# Patient Record
Sex: Female | Born: 1955 | Race: Black or African American | Hispanic: No | Marital: Married | Smoking: Never smoker
Health system: Southern US, Community
[De-identification: ages and names within clinical notes are randomized; demographics above are authoritative.]

## PROBLEM LIST (undated history)

## (undated) DIAGNOSIS — I10 Essential (primary) hypertension: Secondary | ICD-10-CM

## (undated) DIAGNOSIS — Z862 Personal history of diseases of the blood and blood-forming organs and certain disorders involving the immune mechanism: Secondary | ICD-10-CM

## (undated) DIAGNOSIS — T7840XA Allergy, unspecified, initial encounter: Secondary | ICD-10-CM

## (undated) DIAGNOSIS — H409 Unspecified glaucoma: Secondary | ICD-10-CM

## (undated) DIAGNOSIS — R001 Bradycardia, unspecified: Secondary | ICD-10-CM

## (undated) DIAGNOSIS — D571 Sickle-cell disease without crisis: Secondary | ICD-10-CM

## (undated) DIAGNOSIS — Z6841 Body Mass Index (BMI) 40.0 and over, adult: Secondary | ICD-10-CM

## (undated) HISTORY — PX: TUBAL LIGATION: SHX77

## (undated) HISTORY — DX: Unspecified glaucoma: H40.9

## (undated) HISTORY — DX: Body Mass Index (BMI) 40.0 and over, adult: Z684

## (undated) HISTORY — DX: Sickle-cell disease without crisis: D57.1

## (undated) HISTORY — DX: Allergy, unspecified, initial encounter: T78.40XA

## (undated) HISTORY — DX: Morbid (severe) obesity due to excess calories: E66.01

## (undated) HISTORY — PX: BREAST BIOPSY: SHX20

## (undated) HISTORY — PX: BREAST EXCISIONAL BIOPSY: SUR124

## (undated) HISTORY — PX: BIOPSY BREAST: PRO8

## (undated) HISTORY — DX: Bradycardia, unspecified: R00.1

## (undated) HISTORY — PX: UMBILICAL HERNIA REPAIR: SHX196

---

## 1996-09-13 HISTORY — PX: BREAST SURGERY: SHX581

## 2000-06-28 ENCOUNTER — Encounter: Payer: Self-pay | Admitting: Obstetrics and Gynecology

## 2000-06-28 ENCOUNTER — Ambulatory Visit (HOSPITAL_COMMUNITY): Admission: RE | Admit: 2000-06-28 | Discharge: 2000-06-28 | Payer: Self-pay | Admitting: Obstetrics and Gynecology

## 2001-10-16 ENCOUNTER — Other Ambulatory Visit: Admission: RE | Admit: 2001-10-16 | Discharge: 2001-10-16 | Payer: Self-pay | Admitting: Obstetrics and Gynecology

## 2001-12-20 ENCOUNTER — Encounter: Payer: Self-pay | Admitting: Obstetrics and Gynecology

## 2001-12-20 ENCOUNTER — Encounter: Admission: RE | Admit: 2001-12-20 | Discharge: 2001-12-20 | Payer: Self-pay | Admitting: Obstetrics and Gynecology

## 2002-07-29 ENCOUNTER — Emergency Department (HOSPITAL_COMMUNITY): Admission: EM | Admit: 2002-07-29 | Discharge: 2002-07-29 | Payer: Self-pay | Admitting: Emergency Medicine

## 2002-08-02 ENCOUNTER — Encounter: Payer: Self-pay | Admitting: Internal Medicine

## 2002-08-02 ENCOUNTER — Encounter: Admission: RE | Admit: 2002-08-02 | Discharge: 2002-08-02 | Payer: Self-pay | Admitting: Internal Medicine

## 2002-09-10 ENCOUNTER — Encounter: Payer: Self-pay | Admitting: Neurology

## 2002-09-10 ENCOUNTER — Encounter: Admission: RE | Admit: 2002-09-10 | Discharge: 2002-09-10 | Payer: Self-pay | Admitting: Neurology

## 2003-01-04 ENCOUNTER — Encounter: Admission: RE | Admit: 2003-01-04 | Discharge: 2003-01-04 | Payer: Self-pay | Admitting: Obstetrics and Gynecology

## 2003-01-04 ENCOUNTER — Encounter: Payer: Self-pay | Admitting: Obstetrics and Gynecology

## 2004-01-06 ENCOUNTER — Encounter: Admission: RE | Admit: 2004-01-06 | Discharge: 2004-01-06 | Payer: Self-pay | Admitting: Obstetrics and Gynecology

## 2005-01-27 ENCOUNTER — Other Ambulatory Visit: Admission: RE | Admit: 2005-01-27 | Discharge: 2005-01-27 | Payer: Self-pay | Admitting: Obstetrics and Gynecology

## 2005-01-27 ENCOUNTER — Encounter: Admission: RE | Admit: 2005-01-27 | Discharge: 2005-01-27 | Payer: Self-pay | Admitting: Obstetrics and Gynecology

## 2006-04-05 ENCOUNTER — Encounter: Admission: RE | Admit: 2006-04-05 | Discharge: 2006-04-05 | Payer: Self-pay | Admitting: Obstetrics and Gynecology

## 2006-04-19 ENCOUNTER — Emergency Department (HOSPITAL_COMMUNITY): Admission: EM | Admit: 2006-04-19 | Discharge: 2006-04-19 | Payer: Self-pay | Admitting: Family Medicine

## 2006-06-13 ENCOUNTER — Other Ambulatory Visit: Admission: RE | Admit: 2006-06-13 | Discharge: 2006-06-13 | Payer: Self-pay | Admitting: Obstetrics and Gynecology

## 2007-06-13 ENCOUNTER — Encounter: Admission: RE | Admit: 2007-06-13 | Discharge: 2007-06-13 | Payer: Self-pay | Admitting: Obstetrics and Gynecology

## 2008-06-13 ENCOUNTER — Encounter: Admission: RE | Admit: 2008-06-13 | Discharge: 2008-06-13 | Payer: Self-pay | Admitting: Obstetrics and Gynecology

## 2009-06-16 ENCOUNTER — Encounter: Admission: RE | Admit: 2009-06-16 | Discharge: 2009-06-16 | Payer: Self-pay | Admitting: Obstetrics and Gynecology

## 2010-06-17 ENCOUNTER — Encounter: Admission: RE | Admit: 2010-06-17 | Discharge: 2010-06-17 | Payer: Self-pay | Admitting: Obstetrics and Gynecology

## 2010-09-04 ENCOUNTER — Emergency Department (HOSPITAL_COMMUNITY)
Admission: EM | Admit: 2010-09-04 | Discharge: 2010-09-04 | Payer: Self-pay | Source: Home / Self Care | Admitting: Emergency Medicine

## 2011-05-11 ENCOUNTER — Other Ambulatory Visit: Payer: Self-pay | Admitting: Internal Medicine

## 2011-05-11 DIAGNOSIS — Z1231 Encounter for screening mammogram for malignant neoplasm of breast: Secondary | ICD-10-CM

## 2011-06-21 ENCOUNTER — Ambulatory Visit: Payer: Self-pay

## 2011-07-12 ENCOUNTER — Ambulatory Visit: Payer: Self-pay

## 2011-07-16 ENCOUNTER — Ambulatory Visit
Admission: RE | Admit: 2011-07-16 | Discharge: 2011-07-16 | Disposition: A | Discharge status: Home / Self Care | Payer: BC Managed Care – PPO | Source: Ambulatory Visit | Attending: Internal Medicine | Admitting: Internal Medicine

## 2011-07-16 DIAGNOSIS — Z1231 Encounter for screening mammogram for malignant neoplasm of breast: Secondary | ICD-10-CM

## 2011-08-13 ENCOUNTER — Emergency Department (HOSPITAL_COMMUNITY)
Admission: EM | Admit: 2011-08-13 | Discharge: 2011-08-13 | Disposition: A | Discharge status: Home / Self Care | Payer: BC Managed Care – PPO | Attending: Emergency Medicine | Admitting: Emergency Medicine

## 2011-08-13 ENCOUNTER — Encounter: Payer: Self-pay | Admitting: *Deleted

## 2011-08-13 DIAGNOSIS — H5789 Other specified disorders of eye and adnexa: Secondary | ICD-10-CM | POA: Insufficient documentation

## 2011-08-13 DIAGNOSIS — R21 Rash and other nonspecific skin eruption: Secondary | ICD-10-CM | POA: Insufficient documentation

## 2011-08-13 DIAGNOSIS — T7840XA Allergy, unspecified, initial encounter: Secondary | ICD-10-CM | POA: Insufficient documentation

## 2011-08-13 DIAGNOSIS — X58XXXA Exposure to other specified factors, initial encounter: Secondary | ICD-10-CM | POA: Insufficient documentation

## 2011-08-13 DIAGNOSIS — I10 Essential (primary) hypertension: Secondary | ICD-10-CM | POA: Insufficient documentation

## 2011-08-13 HISTORY — DX: Essential (primary) hypertension: I10

## 2011-08-13 MED ORDER — PREDNISONE 10 MG PO TABS: 20.0000 mg | ORAL_TABLET | Freq: Every day | ORAL | Status: AC

## 2011-08-13 MED ORDER — PREDNISONE 20 MG PO TABS
60.0000 mg | ORAL_TABLET | Freq: Once | ORAL | Status: AC
  Administered 2011-08-13: 60 mg via ORAL
  Filled 2011-08-13: qty 3

## 2011-08-13 MED ORDER — OLOPATADINE HCL 0.1 % OP SOLN
1.0000 [drp] | Freq: Two times a day (BID) | OPHTHALMIC | Status: DC
  Administered 2011-08-13: 1 [drp] via OPHTHALMIC
  Filled 2011-08-13: qty 5

## 2011-08-13 NOTE — ED Notes (Signed)
Pt presents w/ redness to L face, swelling, burning pain, scabbing lesions.

## 2011-08-13 NOTE — ED Provider Notes (Signed)
History     CSN: [id number] Arrival date & time: 08/13/2011  8:00 PM   First MD Initiated Contact with Patient 08/13/11 2013      Chief Complaint  Patient presents with  . Allergic Reaction    Pt c/o unilateral redness, burning lesions. R/o shingles    (Consider location/radiation/quality/duration/timing/severity/associated sxs/prior treatment) Patient is a 55 y.o. female presenting with allergic reaction. The history is provided by the patient. No language interpreter was used.  Allergic Reaction The primary symptoms are  rash. The primary symptoms do not include wheezing, shortness of breath, cough, nausea, vomiting, diarrhea, dizziness, palpitations or angioedema. The current episode started 13 to 24 hours ago. The problem has been gradually worsening. This is a new problem.  The rash is associated with itching.  Significant symptoms also include eye redness and itching. Significant symptoms that are not present include rhinorrhea.   Eyes weeping an itchy since about 11am today.  States that she used some eye drops that she may be allergic to from a month ago to her L eye.  Itching around her lips as well.  No angio edema.  Has taken nothing for the itching.  Past Medical History  Diagnosis Date  . Hypertension     Past Surgical History  Procedure Date  . Biopsy breast     History reviewed. No pertinent family history.  History  Substance Use Topics  . Smoking status: Never Smoker   . Smokeless tobacco: Not on file  . Alcohol Use: No    OB History    Grav Para Term Preterm Abortions TAB SAB Ect Mult Living                  Review of Systems  HENT: Negative for rhinorrhea.   Eyes: Positive for redness.  Respiratory: Negative for cough, shortness of breath and wheezing.   Cardiovascular: Negative for palpitations.  Gastrointestinal: Negative for nausea, vomiting and diarrhea.  Skin: Positive for itching and rash.  Neurological: Negative for dizziness.  All  other systems reviewed and are negative.    Allergies  Ibuprofen and Penicillins  Home Medications   Current Outpatient Rx  Name Route Sig Dispense Refill  . BIOTIN 5 MG PO CAPS Oral Take 2 capsules by mouth daily.      Marland Kitchen LISINOPRIL-HYDROCHLOROTHIAZIDE 10-12.5 MG PO TABS Oral Take 1 tablet by mouth daily.        BP 144/85  Pulse 108  Temp(Src) 97.9 F (36.6 C) (Oral)  Resp 20  SpO2 99%  Physical Exam  Nursing note and vitals reviewed. Constitutional: She is oriented to person, place, and time. She appears well-developed and well-nourished.  HENT:  Head: Normocephalic.  Eyes: Pupils are equal, round, and reactive to light. Right eye exhibits discharge. Left eye exhibits discharge.       Eyes red and itchy  With some swelling.    Cardiovascular: Normal rate.   Musculoskeletal: Normal range of motion. She exhibits no edema and no tenderness.  Neurological: She is alert and oriented to person, place, and time.  Skin: Skin is warm and dry. Rash noted.       Some itching but no swelling around lips.    ED Course  Procedures (including critical care time)  Labs Reviewed - No data to display No results found.   No diagnosis found.    MDM  Eye swelling and itching since this am.  Took nothing for itching. Prednisone 60mg  in ER .Marland KitchenWill start benadryl after she  drives home.  Rx for patinol as well.  Follow up with Dr. Lavona Mound on Monday or return if worse.        Jethro Bastos, NP 08/13/11 2125

## 2011-08-14 NOTE — ED Provider Notes (Signed)
Medical screening examination/treatment/procedure(s) were performed by non-physician practitioner and as supervising physician I was immediately available for consultation/collaboration.  Ghali Morissette, MD 08/14/11 0052 

## 2011-09-04 ENCOUNTER — Ambulatory Visit (INDEPENDENT_AMBULATORY_CARE_PROVIDER_SITE_OTHER): Payer: BC Managed Care – PPO

## 2011-09-04 DIAGNOSIS — J40 Bronchitis, not specified as acute or chronic: Secondary | ICD-10-CM

## 2011-09-04 DIAGNOSIS — R05 Cough: Secondary | ICD-10-CM

## 2011-11-11 ENCOUNTER — Ambulatory Visit: Payer: Self-pay | Admitting: Obstetrics and Gynecology

## 2012-01-25 ENCOUNTER — Ambulatory Visit: Payer: Self-pay | Admitting: Obstetrics and Gynecology

## 2012-02-23 ENCOUNTER — Ambulatory Visit (HOSPITAL_COMMUNITY)
Admission: RE | Admit: 2012-02-23 | Discharge: 2012-02-23 | Disposition: A | Discharge status: Home / Self Care | Payer: BC Managed Care – PPO | Source: Ambulatory Visit | Attending: Family Medicine | Admitting: Family Medicine

## 2012-02-23 ENCOUNTER — Ambulatory Visit (INDEPENDENT_AMBULATORY_CARE_PROVIDER_SITE_OTHER): Payer: BC Managed Care – PPO | Admitting: Family Medicine

## 2012-02-23 ENCOUNTER — Encounter: Payer: Self-pay | Admitting: Family Medicine

## 2012-02-23 VITALS — BP 126/82 | HR 66 | Resp 18 | Ht 65.0 in | Wt 311.0 lb

## 2012-02-23 DIAGNOSIS — Z1321 Encounter for screening for nutritional disorder: Secondary | ICD-10-CM

## 2012-02-23 DIAGNOSIS — M79672 Pain in left foot: Secondary | ICD-10-CM

## 2012-02-23 DIAGNOSIS — M7989 Other specified soft tissue disorders: Secondary | ICD-10-CM

## 2012-02-23 DIAGNOSIS — M25569 Pain in unspecified knee: Secondary | ICD-10-CM

## 2012-02-23 DIAGNOSIS — I10 Essential (primary) hypertension: Secondary | ICD-10-CM | POA: Insufficient documentation

## 2012-02-23 DIAGNOSIS — M79609 Pain in unspecified limb: Secondary | ICD-10-CM

## 2012-02-23 DIAGNOSIS — M25561 Pain in right knee: Secondary | ICD-10-CM

## 2012-02-23 DIAGNOSIS — Z13220 Encounter for screening for lipoid disorders: Secondary | ICD-10-CM

## 2012-02-23 DIAGNOSIS — Z1329 Encounter for screening for other suspected endocrine disorder: Secondary | ICD-10-CM

## 2012-02-23 MED ORDER — LISINOPRIL-HYDROCHLOROTHIAZIDE 10-12.5 MG PO TABS: 1.0000 | ORAL_TABLET | Freq: Every day | ORAL | Status: DC

## 2012-02-23 NOTE — Patient Instructions (Addendum)
f/u in 10 weeks.  CBC, fasting chem 7, lipid, TSH, HBA1C , vit D as soon as possible.  You will be referred to orthopedic Doc re left heel pain, spoke with pt after the visit, her main interest is cardiac eval. Record review shows she has had podiatry evaluation  recently.  Will refer to cardiology for asessment of cardiovascular status  CXR today.  Please bring immunization record to next visit.  You will get a 1500 calorie diet sheet, weight loss goal of 10 pounds in 10 weeks, this can be done!  You Need baby asprin one daily for stroke risk reduction, a;so calcium with D 1200mg  /1000Iu one gel capsule once daily for bone health

## 2012-02-23 NOTE — Progress Notes (Signed)
Subjective:    Patient ID: Stefanie Taylor, female    DOB: [birth year], 56 y.o.   MRN: [id number]  HPI The PT is here for establishment of care, concerns voiced are thinning and hair loss more so in the last 2 years.  Nails are thin , chipping and brittle  Menopause was 3 years ago. C/o swelling and deformity of ankles also c/o left foot pain states she was  Told  She had fasciitis, and heel spurs , she has orthotics and has basically been told she will need to deal with the discomfort till she feels it is unbearable to the extent that surgical intervention is needed, she is definitely not at that pointnow, and is more interested in focusing on weight loss. After attempts to get herin to see podiatry, who she had last seen in 2008, she sttated her main concern was that her ankle swelling is not due to heart disease, and no one has answered that questions. Records have been made available for review following the visit and theses are being peursed carefully. Has had colonoscopy by [name] in 2007, will f/u report Stefanie Taylor been seen by dermatology in 01/2012 for a rash, suggestion was that HCTZ be avoided, per notes from previous PC, pt was not interested in making any changes in her BP medication when she last saw her in May,,2013   Has allergies which she uses oTC medication for No incontinence issues No cardiologists, but desires cardiac evaluation due to intermittent leg swelling, hypertension , she is concerned about  possibility of CAD Family history is positive for hypertension and diabetes. Mother also noted to have "heart disease" One sister and paternal grandparents were alcoholic Knees hurt left more than right  Concerbned about her weight, wants to start with dietary change and increased physical activity, but after briefly asking for appetite suppressant , which I declined as she is new with HTN and requsting cardiac eval, she stated that she may well be interested in surgical  intervention  Eye exam up to date , and has upcoming pap with her gynecologist.  Menopause was around 2009, had irregular  post menopausal bleeding in 2011, she is followed by Dr Stefano Gaul and has been evaluated for this  Immunization she reports as being up to date , and will provide records, she travels out of the [place] often reportedly.         Review of Systems See HPI Denies recent fever or chills. Denies sinus pressure, nasal congestion, ear pain or sore throat. Denies chest congestion, productive cough or wheezing. Denies chest pains, palpitations , PND or orthopnea. C/O bilateral leg swelling which has  Concerned her for some time, wants cardiology evaluation  Denies abdominal pain, nausea, vomiting,diarrhea or constipation.   Denies dysuria, frequency, hesitancy or incontinence.  Denies headaches, seizures, numbness, or tingling. Denies depression, anxiety or insomnia.       Objective:   Physical Exam Patient alert and oriented and in no cardiopulmonary distress.  HEENT: No facial asymmetry, EOMI, no sinus tenderness,  oropharynx pink and moist.  Neck supple no adenopathy.  Chest: Clear to auscultation bilaterally.  CVS: S1, S2 no murmurs, no S3.  ABD: Soft non tender. Bowel sounds normal.  Ext:one plus pitting  edema  MS: Decreased ROM  Knees with deformity, also deformity in ankles  Skin: Intact, no ulcerations noted.  Psych: Good eye contact, normal affect. Memory intact not anxious or depressed appearing.  CNS: CN 2-12 intact, power normal throughout.  Assessment & Plan:

## 2012-02-24 LAB — LIPID PANEL
Cholesterol: 135 mg/dL (ref 0–200)
LDL Cholesterol: 74 mg/dL (ref 0–99)
Total CHOL/HDL Ratio: 2.5 Ratio
Triglycerides: 41 mg/dL (ref <150)
VLDL: 8 mg/dL (ref 0–40)

## 2012-02-24 LAB — CBC
MCH: 28 pg (ref 26.0–34.0)
MCHC: 33.5 g/dL (ref 30.0–36.0)
Platelets: 195 10*3/uL (ref 150–400)
RDW: 15.1 % (ref 11.5–15.5)
WBC: 4.1 10*3/uL (ref 4.0–10.5)

## 2012-02-24 LAB — BASIC METABOLIC PANEL
BUN: 13 mg/dL (ref 6–23)
Calcium: 9.1 mg/dL (ref 8.4–10.5)
Potassium: 3.6 mEq/L (ref 3.5–5.3)
Sodium: 138 mEq/L (ref 135–145)

## 2012-02-25 LAB — HEMOGLOBIN A1C
Hgb A1c MFr Bld: 5 % (ref <5.7)
Mean Plasma Glucose: 97 mg/dL (ref <117)

## 2012-02-29 DIAGNOSIS — M79672 Pain in left foot: Secondary | ICD-10-CM | POA: Insufficient documentation

## 2012-03-01 ENCOUNTER — Telehealth (HOSPITAL_COMMUNITY): Payer: Self-pay | Admitting: Dietician

## 2012-03-01 ENCOUNTER — Telehealth: Payer: Self-pay | Admitting: Family Medicine

## 2012-03-01 NOTE — Telephone Encounter (Signed)
Received referral from Dr. Lodema Hong ([facility]) via fax for dx: morbid obesity and HTN.

## 2012-03-02 DIAGNOSIS — R609 Edema, unspecified: Secondary | ICD-10-CM | POA: Insufficient documentation

## 2012-03-02 DIAGNOSIS — M25561 Pain in right knee: Secondary | ICD-10-CM | POA: Insufficient documentation

## 2012-03-02 NOTE — Assessment & Plan Note (Addendum)
Referred to nutritionist.  Counselled re calorie control and regular eating on schedule.  Encouraged regular physical activity.  Pt states she may be interested in surgical intervention, but will "see how she does " over the next several weeks

## 2012-03-02 NOTE — Telephone Encounter (Signed)
After I spoke with her today, she is really wanting her heart to be evaluated by cardiology, so I am referring to [facility] cardiology in [facility] Dr Dietrich Pates, I will also send him a message, pls get early morning appt

## 2012-03-02 NOTE — Assessment & Plan Note (Signed)
Likely due to osteoarthritis, has seen orthopedics in the past and wants to focus on weight losss. No h/o falls or instability

## 2012-03-02 NOTE — Assessment & Plan Note (Signed)
Controlled, no change in medication Mention is made of potential allergic skin reaction to HCTZ, pt is currently opting to remain on this medication

## 2012-03-02 NOTE — Assessment & Plan Note (Signed)
Chronic has been evaluated by podiatry in the past year, pt reports relief with prosthetics. Is aware that weight loss will play a significant role in improving pain

## 2012-03-02 NOTE — Assessment & Plan Note (Signed)
Lower extremity swelling in hypertensive pt, no recent cardiology evaluation will refer for same

## 2012-03-06 NOTE — Telephone Encounter (Signed)
Appointment scheduled for Monday, 03/13/12 at 2:00 PM.

## 2012-03-07 ENCOUNTER — Ambulatory Visit (INDEPENDENT_AMBULATORY_CARE_PROVIDER_SITE_OTHER): Payer: BC Managed Care – PPO | Admitting: Cardiovascular Disease

## 2012-03-07 ENCOUNTER — Encounter: Payer: Self-pay | Admitting: Cardiovascular Disease

## 2012-03-07 ENCOUNTER — Other Ambulatory Visit: Payer: Self-pay | Admitting: Cardiovascular Disease

## 2012-03-07 ENCOUNTER — Telehealth (HOSPITAL_COMMUNITY): Payer: Self-pay | Admitting: Dietician

## 2012-03-07 VITALS — BP 142/79 | HR 67 | Resp 18 | Ht 65.0 in | Wt 305.0 lb

## 2012-03-07 DIAGNOSIS — M79672 Pain in left foot: Secondary | ICD-10-CM

## 2012-03-07 DIAGNOSIS — M79609 Pain in unspecified limb: Secondary | ICD-10-CM

## 2012-03-07 DIAGNOSIS — R609 Edema, unspecified: Secondary | ICD-10-CM

## 2012-03-07 DIAGNOSIS — I10 Essential (primary) hypertension: Secondary | ICD-10-CM

## 2012-03-07 MED ORDER — LISINOPRIL 20 MG PO TABS: 20.0000 mg | ORAL_TABLET | Freq: Every day | ORAL | Status: DC

## 2012-03-07 MED ORDER — FUROSEMIDE 20 MG PO TABS: 20.0000 mg | ORAL_TABLET | Freq: Every day | ORAL | Status: DC

## 2012-03-07 NOTE — Assessment & Plan Note (Signed)
F/U podiatry She is hesitant to have surgery but limits her activity

## 2012-03-07 NOTE — Assessment & Plan Note (Signed)
Discussed bariatric surgery She is not interested. Diet and activity discussed.

## 2012-03-07 NOTE — Telephone Encounter (Signed)
Mailed appointment confirmation letter and instructions for appointment scheduled for 03/13/12 at 2:00 PM via [place] Mail.

## 2012-03-07 NOTE — Assessment & Plan Note (Signed)
Increase lisinopril to 20 mg Continue attempts at diet and weight loss

## 2012-03-07 NOTE — Assessment & Plan Note (Signed)
Dependant Doubt cardiac issues.  Echo and LE venous duplex D/C HCTZ and start Lasix 20 mg

## 2012-03-07 NOTE — Patient Instructions (Addendum)
**Note De-Identified Achille Xiang Obfuscation** Your physician has requested that you have an echocardiogram. Echocardiography is a painless test that uses sound waves to create images of your heart. It provides your doctor with information about the size and shape of your heart and how well your heart's chambers and valves are working. This procedure takes approximately one hour. There are no restrictions for this procedure.  Your physician has requested that you have a lower or upper extremity venous duplex. This test is an ultrasound of the veins in the legs or arms. It looks at venous blood flow that carries blood from the heart to the legs or arms. Allow one hour for a Lower Venous exam. Allow thirty minutes for an Upper Venous exam. There are no restrictions or special instructions.  Your physician recommends that you return for lab work in: 8 weeks  Your physician has recommended you make the following change in your medication: stop taking Lisinopril/HCT and start taking Lisinopril 20 mg daily and Furosemide 20 mg daily  Your physician recommends that you schedule a follow-up appointment in: as needed

## 2012-03-07 NOTE — Progress Notes (Signed)
Patient ID: Stefanie Taylor, female   DOB: 04-29-1956, 56 y.o.   MRN: [id number] 56 yo obese female with chronic LE edema.  Activity limited by bone spur in left foot.  On HCTZ for BP.  No history of DVT.  No history of cardiac problems.  Tries to do water aerobics and walk but limited.  Mild exertional dyspnea.  Compliant with meds.  Concerned that edema is from something besides obesity as she has some overweight friends who do not have swelling.  No history of kidney disease Recent Cr, TSH and A1c all normal.  Tries to watch her salt and has some support hose.    ROS: Denies fever, malais, weight loss, blurry vision, decreased visual acuity, cough, sputum, SOB, hemoptysis, pleuritic pain, palpitaitons, heartburn, abdominal pain, melena, lower extremity edema, claudication, or rash.  All other systems reviewed and negative   General: Affect appropriate Obese charming black female HEENT: normal Neck supple with no adenopathy JVP normal no bruits no thyromegaly Lungs clear with no wheezing and good diaphragmatic motion Heart:  S1/S2 no murmur,rub, gallop or click PMI normal Abdomen: benighn, BS positve, no tenderness, no AAA no bruit.  No HSM or HJR Distal pulses intact with no bruits Plus two bilateral  edema Neuro non-focal Skin warm and dry No muscular weakness  Medications Current Outpatient Prescriptions  Medication Sig Dispense Refill  . aspirin 81 MG tablet Take 81 mg by mouth daily.      . Cholecalciferol (VITAMIN D PO) Take by mouth daily.      . Diclofenac Sodium 1.5 % SOLN Place onto the skin as directed.      . Multiple Vitamin (MULTIVITAMIN) tablet Take 1 tablet by mouth daily. Source of life      . Specialty Vitamins Products (VITAMINS FOR HAIR PO) Take by mouth.        Allergies Ibuprofen and Penicillins  Family History: Family History  Problem Relation Age of Onset  . Hypertension Mother   . Heart disease Mother   . Cancer Mother   . Hypertension Father   .  Alcohol abuse Sister   . Hypertension Sister   . Diabetes Brother   . Hypertension Brother   . Diabetes Sister   . Hypertension Sister   . Diabetes Sister   . Hypertension Sister   . Diabetes Brother   . Hypertension Brother   . Diabetes Brother   . Hypertension Brother   . Diabetes Brother   . Hypertension Brother   . Hypertension Brother     Social History: History   Social History  . Marital Status: Married    Spouse Name: N/A    Number of Children: N/A  . Years of Education: N/A   Occupational History  . Not on file.   Social History Main Topics  . Smoking status: Never Smoker   . Smokeless tobacco: Not on file  . Alcohol Use: No  . Drug Use: No  . Sexually Active:    Other Topics Concern  . Not on file   Social History Narrative  . No narrative on file    Electrocardiogram:  NSR PAC LAE otherwise normal  Assessment and Plan

## 2012-03-08 ENCOUNTER — Ambulatory Visit: Payer: BC Managed Care – PPO | Admitting: Cardiovascular Disease

## 2012-03-13 ENCOUNTER — Encounter (HOSPITAL_COMMUNITY): Payer: Self-pay | Admitting: Dietician

## 2012-03-13 NOTE — Progress Notes (Signed)
Outpatient Initial Nutrition Assessment  Date:03/13/2012   Time: 2:15 PM  Referring Physician: Dr. Lodema Hong Reason for Visit: obesity, HTN  Nutrition Assessment:  Height: 5\' 5"  (165.1 cm)   Weight: 310 lb 12.8 oz (140.978 kg)   IBW: 125# %IBW: 249% UBW: 280# %UBW: 111% Body mass index is 51.72 kg/(m^2).  Goal Weight: 180# (per pt) Weight hx: Pt reports the last time she was at her UBW was 2 years ago. She reports her high weight was 322 in 2001 and her lowest weight was 180# in 1984. She desires to achieve that weight again, as that was where she was most comfortable. She report progressive weight gain starting 33 years ago, when she was pregnant with her adult daughter. She reports that she used to very very active and fit during her pregnancy and was able to fit into her pre-pregnancy clothes up until 5 months. She reports that her OB/GYN told her she was not gaining enough weight to support her pregnancy and ended up gaining 50# during the pregnancy. She has struggled with weight ever since, particuarly due to struggle of balancing career and family life.  Estimated nutritional needs: 2249-2453 kcals daily, [id number] grams protein daily, 2.2-2.5 L fluid daily  PMH:  Past Medical History  Diagnosis Date  . Hypertension     Medications:  Current Outpatient Rx  Name Route Sig Dispense Refill  . ASPIRIN 81 MG PO TABS Oral Take 81 mg by mouth daily.    Marland Kitchen VITAMIN D PO Oral Take by mouth daily.    Marland Kitchen DICLOFENAC SODIUM 1.5 % TD SOLN Transdermal Place onto the skin as directed.    . FUROSEMIDE 20 MG PO TABS Oral Take 1 tablet (20 mg total) by mouth daily. 90 tablet 3  . LISINOPRIL 20 MG PO TABS Oral Take 1 tablet (20 mg total) by mouth daily. 90 tablet 3  . ONE-DAILY MULTI VITAMINS PO TABS Oral Take 1 tablet by mouth daily. Source of life    . VITAMINS FOR HAIR PO Oral Take by mouth.      Labs: CMP     Component Value Date/Time   NA 138 02/24/2012 0830   K 3.6 02/24/2012 0830   CL 103  02/24/2012 0830   CO2 28 02/24/2012 0830   GLUCOSE 83 02/24/2012 0830   BUN 13 02/24/2012 0830   CREATININE 0.73 02/24/2012 0830   CALCIUM 9.1 02/24/2012 0830    Lipid Panel     Component Value Date/Time   CHOL 135 02/24/2012 0830   TRIG 41 02/24/2012 0830   HDL 53 02/24/2012 0830   CHOLHDL 2.5 02/24/2012 0830   VLDL 8 02/24/2012 0830   LDLCALC 74 02/24/2012 0830     Lab Results  Component Value Date   HGBA1C 5.0 02/24/2012   Lab Results  Component Value Date   LDLCALC 74 02/24/2012   CREATININE 0.73 02/24/2012     Lifestyle/ social habits: Ms. Stroschein lives in [place], [place] with her husband. She has an adult daughter who lives in [place]. 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 She drinks 0.5-1 bottle of wine with her dinner. She reports a very high stress level, due to work.   Nutrition hx/habits: Ms. Rom desires weight loss. She wants to lose 100#. She reports that she struggles with portion control and exercising. She reports she has been cutting back on starch and rice, as it aggravates her eczema. She reports that she does not eat raw  sugar. She has cut back on wine and coconut cake. She eats out 1 x per month. She and her husband share cooking responsibilities at home. She has started walking (sporadically) and recently bought a bicycle. She reports she used to do water aerobics and she achieved results when she did, but has not done those since the aquatic center discontinued them. She has been unable to find another gym or aquatic center that meets her needs.  Diet recall: 6:30 AM: water or coffee with french vanilla whey protein; Breakfast (8-8:30 AM): 2 pieces whole wheat toast, 2 scrambled eggs, shredded cheddar cheese; Lunch (12 PM): vegetable medley, [place] wing; Dinner: asparagus, tilapia, green pepper, pineapple. She drinks 1 gallon of water daily.   Nutrition Diagnosis: Involuntary weight gain r/t excessive energy intake,  physical inactivity AEB 30# (10.7%) weight gain x 2 years.   Nutrition Intervention: Nutrition rx: 1800 kcal NAS, no added sugar diet; limit 1 starch per meal; low calorie beverages only; 30 minutes physical activity 5 times per week  Education/Counseling Provided: Educated pt on weight loss principles. Emphasized plate method, healthy food preparation methods, and portion control. Discussed slow, moderate weight loss. Discussed importance of physical activity along with dietary modification to assist with weight loss. Showed pt functionality of MyFitnessPal app. Discussed healthy snack ideas. Provided handouts from the Academy of Nutrition and Dietetics (Weight Loss Tips and 1500 Calorie Meal Plan). Also provided Nov[employer] MedLink carb counting booklet for portion control.   Understanding, Motivation, Ability to Follow Recommendations: Expect fair to good compliance.  Monitoring and Evaluation: Goals: 1) 1-2# weight loss per week; 2) 30 minutes physical activity 5 times per week  Recommendations: 1) For weight loss: 1749-1953 kcals daily; 2) Break physical activity up into smaller, more frequent sessions; 3) Choose an exercise you enjoy; 4) Limit wine to 1 glass daily; 5) Keep food diary (ex. MyFitnessPal)  F/U: PRN. Provided RD contact information.   Orlene Plum, RD  03/13/2012  Time: 2:15 PM

## 2012-03-14 ENCOUNTER — Ambulatory Visit (HOSPITAL_COMMUNITY): Payer: BC Managed Care – PPO

## 2012-03-14 ENCOUNTER — Ambulatory Visit (HOSPITAL_COMMUNITY)
Admission: RE | Admit: 2012-03-14 | Discharge: 2012-03-14 | Disposition: A | Discharge status: Home / Self Care | Payer: BC Managed Care – PPO | Source: Ambulatory Visit | Attending: Cardiovascular Disease | Admitting: Cardiovascular Disease

## 2012-03-14 DIAGNOSIS — M7989 Other specified soft tissue disorders: Secondary | ICD-10-CM | POA: Insufficient documentation

## 2012-03-14 DIAGNOSIS — R609 Edema, unspecified: Secondary | ICD-10-CM

## 2012-03-14 DIAGNOSIS — M7120 Synovial cyst of popliteal space [Baker], unspecified knee: Secondary | ICD-10-CM | POA: Insufficient documentation

## 2012-03-15 ENCOUNTER — Ambulatory Visit (HOSPITAL_COMMUNITY)
Admission: RE | Admit: 2012-03-15 | Discharge: 2012-03-15 | Disposition: A | Discharge status: Home / Self Care | Payer: BC Managed Care – PPO | Source: Ambulatory Visit | Attending: Cardiovascular Disease | Admitting: Cardiovascular Disease

## 2012-03-15 DIAGNOSIS — I10 Essential (primary) hypertension: Secondary | ICD-10-CM | POA: Insufficient documentation

## 2012-03-15 DIAGNOSIS — I519 Heart disease, unspecified: Secondary | ICD-10-CM

## 2012-03-15 DIAGNOSIS — R609 Edema, unspecified: Secondary | ICD-10-CM | POA: Insufficient documentation

## 2012-03-15 NOTE — Progress Notes (Signed)
*  PRELIMINARY RESULTS* Echocardiogram 2D Echocardiogram has been performed.  Caswell Corwin 03/15/2012, 8:35 AM

## 2012-03-24 ENCOUNTER — Telehealth: Payer: Self-pay

## 2012-03-24 MED ORDER — HYDROCHLOROTHIAZIDE 25 MG PO TABS: 25.0000 mg | ORAL_TABLET | Freq: Every day | ORAL | Status: DC

## 2012-03-24 NOTE — Telephone Encounter (Signed)
Stop lasix and take HCTZ 25 mg

## 2012-03-24 NOTE — Telephone Encounter (Signed)
**Note De-Identified Stefanie Taylor Obfuscation** Pt's assistant, Toniann Fail, advised and she verbalized understanding. RX sent to [facility] in [place]./LV

## 2012-03-24 NOTE — Telephone Encounter (Addendum)
Pt. states that she is having allergic reaction to change in medications. On last OV with Dr. Eden Emms on 6-25 pt. was advised to stop taking Lisinopril/HCT 10/12.5 mg qd and start taking Lisinopril 20 mg qd and Furosemide 20 mg qd. She states that within a few days she developed extreme itching and what she describes as "lesions" appeared on her skin. She states she went to ED at [facility]. On 7-10 and was advised to stop taking both Furosemide and Lisinopril  And to start taking Predisone 20 mg tid X 4 days and Diphenenhydramine 25 mg to be taken q6hrs. and to contact us that she is not taking anything at this time for BP. Pt's BP this morning was 145/90. Please advise./LV

## 2012-04-09 ENCOUNTER — Ambulatory Visit (INDEPENDENT_AMBULATORY_CARE_PROVIDER_SITE_OTHER): Payer: BC Managed Care – PPO | Admitting: Physician Assistant

## 2012-04-09 VITALS — BP 109/71 | HR 66 | Temp 98.0°F | Resp 16 | Ht 63.75 in | Wt 304.0 lb

## 2012-04-09 DIAGNOSIS — H1013 Acute atopic conjunctivitis, bilateral: Secondary | ICD-10-CM

## 2012-04-09 DIAGNOSIS — H11009 Unspecified pterygium of unspecified eye: Secondary | ICD-10-CM

## 2012-04-09 MED ORDER — OLOPATADINE HCL 0.1 % OP SOLN: 1.0000 [drp] | Freq: Two times a day (BID) | OPHTHALMIC | Status: DC

## 2012-04-09 NOTE — Progress Notes (Signed)
  Subjective:    Patient ID: Stefanie Taylor, female    DOB: 16-Nov-1955, 56 y.o.   MRN: [id number]  HPI  Pt presents to clinic with B eye redness and tearing that started yesterday afternoon without any known injury to her eye.  Her eyes do not hurts but they are irritated.  She is having no vision problems and no photophobia.  She has been using OTC eye gtts which has helped with her irritation.  Her eyes were matted this am slightly.  She has had no exposures to bacterial conjunctivitis or cold symptoms.  She does have seasonal allergies.  Review of Systems  Constitutional: Negative for fever.  HENT: Negative for congestion, rhinorrhea and postnasal drip.   Eyes: Positive for discharge, redness and itching. Negative for photophobia, pain and visual disturbance.       Objective:   Physical Exam  Vitals reviewed. Constitutional: She is oriented to person, place, and time. She appears well-developed and well-nourished.  HENT:  Head: Normocephalic and atraumatic.  Right Ear: External ear normal.  Left Ear: External ear normal.  Nose: Nose normal.  Eyes: EOM are normal. Pupils are equal, round, and reactive to light. Right eye exhibits discharge (tears). Left eye exhibits discharge (Tears). Right conjunctiva is injected. Right conjunctiva has no hemorrhage. Left conjunctiva is injected. Left conjunctiva has no hemorrhage. No scleral icterus.       Both eyelids are edematous   Neck: Neck supple.  Pulmonary/Chest: Effort normal.  Neurological: She is alert and oriented to person, place, and time.  Skin: Skin is warm and dry. Rash noted.  Psychiatric: She has a normal mood and affect. Her behavior is normal. Judgment and thought content normal.      Assessment & Plan:   1. Allergic conjunctivitis of both eyes  olopatadine (PATANOL) 0.1 % ophthalmic solution   Pt to try and stop rubbing eyes because that is probably why her eyelids are swollen.  She should use Cool no cold compresses  to help with swelling.  She can add a OTC allergy medication for quicker improvement.  Pt to monitor to change in discharge to yellow purulence and call us if that occurs.  Pt voiced understanding and agreed with the above.

## 2012-04-24 ENCOUNTER — Other Ambulatory Visit: Payer: Self-pay | Admitting: *Deleted

## 2012-04-24 DIAGNOSIS — I10 Essential (primary) hypertension: Secondary | ICD-10-CM

## 2012-04-26 ENCOUNTER — Encounter (HOSPITAL_COMMUNITY): Payer: Self-pay | Admitting: Emergency Medicine

## 2012-04-26 ENCOUNTER — Emergency Department (HOSPITAL_COMMUNITY)
Admission: EM | Admit: 2012-04-26 | Discharge: 2012-04-26 | Disposition: A | Discharge status: Home / Self Care | Payer: BC Managed Care – PPO | Attending: Emergency Medicine | Admitting: Emergency Medicine

## 2012-04-26 ENCOUNTER — Telehealth: Payer: Self-pay | Admitting: Family Medicine

## 2012-04-26 DIAGNOSIS — Z833 Family history of diabetes mellitus: Secondary | ICD-10-CM | POA: Insufficient documentation

## 2012-04-26 DIAGNOSIS — Z8249 Family history of ischemic heart disease and other diseases of the circulatory system: Secondary | ICD-10-CM | POA: Insufficient documentation

## 2012-04-26 DIAGNOSIS — Z880 Allergy status to penicillin: Secondary | ICD-10-CM | POA: Insufficient documentation

## 2012-04-26 DIAGNOSIS — I10 Essential (primary) hypertension: Secondary | ICD-10-CM | POA: Insufficient documentation

## 2012-04-26 DIAGNOSIS — L309 Dermatitis, unspecified: Secondary | ICD-10-CM

## 2012-04-26 DIAGNOSIS — Z7982 Long term (current) use of aspirin: Secondary | ICD-10-CM | POA: Insufficient documentation

## 2012-04-26 DIAGNOSIS — L259 Unspecified contact dermatitis, unspecified cause: Secondary | ICD-10-CM | POA: Insufficient documentation

## 2012-04-26 DIAGNOSIS — Z6379 Other stressful life events affecting family and household: Secondary | ICD-10-CM | POA: Insufficient documentation

## 2012-04-26 DIAGNOSIS — Z809 Family history of malignant neoplasm, unspecified: Secondary | ICD-10-CM | POA: Insufficient documentation

## 2012-04-26 MED ORDER — TRIAMCINOLONE ACETONIDE 40 MG/ML IJ SUSP
40.0000 mg | Freq: Once | INTRAMUSCULAR | Status: AC
  Administered 2012-04-26: 40 mg via INTRAMUSCULAR
  Filled 2012-04-26: qty 1

## 2012-04-26 MED ORDER — TRIAMCINOLONE ACETONIDE 0.1 % EX CREA: TOPICAL_CREAM | Freq: Two times a day (BID) | CUTANEOUS | Status: DC

## 2012-04-26 NOTE — ED Notes (Signed)
Pt presenting to ed with c/o eczema flare up pt states rash to her upper body and thighs. Pt states she doesn't have any medications for eczema. Pt states positive itching and discomfort. Pt states onset x 1 day

## 2012-04-26 NOTE — ED Provider Notes (Signed)
History     CSN: [id number]  Arrival date & time 04/26/12  1622   First MD Initiated Contact with Patient 04/26/12 1739      Chief Complaint  Patient presents with  . Rash    (Consider location/radiation/quality/duration/timing/severity/associated sxs/prior treatment) HPI  56 y.o. female in no acute distress complaining of eczema and flareup over the course of the last day. Itching is not relieved with Benadryl denies any fever, discharge, change from prior eczema exacerbations. Rash is located on the torso bilateral upper extremities and thighs.   Past Medical History  Diagnosis Date  . Hypertension     Past Surgical History  Procedure Date  . Biopsy breast   . Breast surgery 1998    biopsy mole on right breast    Family History  Problem Relation Age of Onset  . Hypertension Mother   . Heart disease Mother   . Cancer Mother   . Hypertension Father   . Alcohol abuse Sister   . Hypertension Sister   . Diabetes Brother   . Hypertension Brother   . Diabetes Sister   . Hypertension Sister   . Diabetes Sister   . Hypertension Sister   . Diabetes Brother   . Hypertension Brother   . Diabetes Brother   . Hypertension Brother   . Diabetes Brother   . Hypertension Brother   . Hypertension Brother     History  Substance Use Topics  . Smoking status: Never Smoker   . Smokeless tobacco: Not on file  . Alcohol Use: No    OB History    Grav Para Term Preterm Abortions TAB SAB Ect Mult Living                  Review of Systems  Skin: Positive for rash.  All other systems reviewed and are negative.    Allergies  Lasix; Ibuprofen; and Penicillins  Home Medications   Current Outpatient Rx  Name Route Sig Dispense Refill  . ASPIRIN EC 81 MG PO TBEC Oral Take 81 mg by mouth daily.    Marland Kitchen CETIRIZINE HCL 10 MG PO TABS Oral Take 10 mg by mouth daily.    Marland Kitchen VITAMIN D 1000 UNITS PO TABS Oral Take 1,000 Units by mouth daily.    Marland Kitchen CINNAMON PO Oral Take 1 capsule  by mouth daily.    Marland Kitchen CLOBETASOL PROPIONATE 0.05 % EX OINT Topical Apply 1 application topically 2 (two) times daily.    Marland Kitchen DIPHENHYDRAMINE HCL 25 MG PO CAPS Oral Take 25 mg by mouth every 6 (six) hours as needed. For itching.    Marland Kitchen HYDROCHLOROTHIAZIDE 25 MG PO TABS Oral Take 1 tablet (25 mg total) by mouth daily. 90 tablet 3    To replace Lasix  . LISINOPRIL 20 MG PO TABS Oral Take 1 tablet (20 mg total) by mouth daily. 90 tablet 3  . PREDNISOLONE ACETATE 1 % OP SUSP Both Eyes Place 1 drop into both eyes 2 (two) times daily.      BP 143/80  Pulse 69  Temp 98.9 F (37.2 C) (Oral)  Resp 18  SpO2 100%  Physical Exam  Nursing note and vitals reviewed. Constitutional: She is oriented to person, place, and time. She appears well-developed and well-nourished. No distress.  HENT:  Head: Normocephalic.  Eyes: Conjunctivae and EOM are normal.  Cardiovascular: Normal rate.   Pulmonary/Chest: Effort normal.  Abdominal: Soft. Bowel sounds are normal.  Musculoskeletal: Normal range of motion.  Neurological: She  is alert and oriented to person, place, and time.  Skin: Rash noted.       Hyperpigmented raised lesions to epigastric region and extensor surfaces of upper and lower extremities. The signs of infection: No tenderness, warmth, erythema or induration.  Psychiatric: She has a normal mood and affect.    ED Course  Procedures (including critical care time)  Labs Reviewed - No data to display No results found.   1. Eczema       MDM  Eczema exacerbation I will give her a shot of Kenalog 40 mg and write her for Kenalog topical.         Wynetta Emery, PA-C 04/27/12 1610

## 2012-04-26 NOTE — Telephone Encounter (Signed)
Spoke with gentleman, who stated that patient had gone to the ED due to severe eczema. I will call her back in am

## 2012-04-26 NOTE — ED Notes (Signed)
Pt reports that she has tried oatmeal baths,aquafor, and cocoa butter, anti-itch oil/gel, olive oil, shea butter all OTC with no relief. Pt states," something is drying me out and I don't know what it is." Pt reports that she stays hydrated and avoids sodas. Pt reports sleeplessness as a result of the flare-up.

## 2012-04-27 ENCOUNTER — Other Ambulatory Visit: Payer: Self-pay

## 2012-04-27 ENCOUNTER — Other Ambulatory Visit: Payer: Self-pay | Admitting: Family Medicine

## 2012-04-27 ENCOUNTER — Telehealth: Payer: Self-pay

## 2012-04-27 MED ORDER — PREDNISONE (PAK) 5 MG PO TABS: 5.0000 mg | ORAL_TABLET | ORAL | Status: DC

## 2012-04-27 NOTE — Telephone Encounter (Signed)
With pt, states her eczema so bad on entire trunk, everything is painful, unable to go to court. Has had prednisone dose pack in the beginning of July, was at [facility] with similar complaint,this helped.already taking OTC benadryl, will send in prednisone dose pack, she is aware

## 2012-04-27 NOTE — ED Provider Notes (Signed)
Medical screening examination/treatment/procedure(s) were performed by non-physician practitioner and as supervising physician I was immediately available for consultation/collaboration.  Toy Baker, MD 04/27/12 2322

## 2012-05-02 ENCOUNTER — Ambulatory Visit: Payer: BC Managed Care – PPO | Admitting: Family Medicine

## 2012-05-03 ENCOUNTER — Encounter: Payer: Self-pay | Admitting: *Deleted

## 2012-05-05 ENCOUNTER — Ambulatory Visit: Payer: BC Managed Care – PPO | Admitting: Family Medicine

## 2012-05-08 ENCOUNTER — Telehealth: Payer: Self-pay | Admitting: Family Medicine

## 2012-05-08 ENCOUNTER — Ambulatory Visit (HOSPITAL_COMMUNITY)
Admission: RE | Admit: 2012-05-08 | Discharge: 2012-05-08 | Disposition: A | Discharge status: Home / Self Care | Payer: BC Managed Care – PPO | Source: Ambulatory Visit | Attending: Physical Medicine and Rehabilitation | Admitting: Physical Medicine and Rehabilitation

## 2012-05-08 ENCOUNTER — Other Ambulatory Visit (HOSPITAL_COMMUNITY): Payer: Self-pay | Admitting: Physical Medicine and Rehabilitation

## 2012-05-08 DIAGNOSIS — M25579 Pain in unspecified ankle and joints of unspecified foot: Secondary | ICD-10-CM | POA: Insufficient documentation

## 2012-05-08 DIAGNOSIS — R52 Pain, unspecified: Secondary | ICD-10-CM

## 2012-05-08 DIAGNOSIS — M7730 Calcaneal spur, unspecified foot: Secondary | ICD-10-CM | POA: Insufficient documentation

## 2012-05-08 LAB — BASIC METABOLIC PANEL
BUN: 15 mg/dL (ref 6–23)
Calcium: 9.9 mg/dL (ref 8.4–10.5)
Creat: 0.71 mg/dL (ref 0.50–1.10)

## 2012-05-10 ENCOUNTER — Telehealth: Payer: Self-pay | Admitting: Cardiovascular Disease

## 2012-05-10 NOTE — Telephone Encounter (Signed)
Request sent to Dr. Eden Emms.

## 2012-05-10 NOTE — Telephone Encounter (Signed)
Please advise 

## 2012-05-10 NOTE — Telephone Encounter (Signed)
PT WAS SEEN BY DERMATOLOGIST AND IT ALLERGIC TO SULFUR BASED MEDS. SHE IS ON FUROSEMIDE AND HTZ.

## 2012-05-11 NOTE — Telephone Encounter (Signed)
Discussed with pharmacy and reviewed her chart. Last note from Dr. Eden Emms has her off of HCTZ and changed to lasix only. She takes the diuretic for chronic LEE and hypertension. She can take Edecrin (or generic eqivalent) 25 mg BID and stop the lasix. She will need BP check and BMET in one week, once she changes to new medication.

## 2012-05-11 NOTE — Telephone Encounter (Signed)
She can continue to take lasix and HCTZ unless she is having an allergic reaction to it now. There are many people allergic to sulfa drugs who are able to take lasix and HCTZ. If she is having reaction, will consider alternative.

## 2012-05-11 NOTE — Telephone Encounter (Signed)
Patient has been diagnosed with severe eczema, which her dermatologist feels is directly related to her medication regimen.  Has been in the ED 6 times since last visit with Dr Eden Emms.

## 2012-05-16 NOTE — Telephone Encounter (Signed)
Message left for return phone call

## 2012-05-23 ENCOUNTER — Encounter: Payer: Self-pay | Admitting: Family Medicine

## 2012-05-23 ENCOUNTER — Ambulatory Visit (INDEPENDENT_AMBULATORY_CARE_PROVIDER_SITE_OTHER): Payer: BC Managed Care – PPO | Admitting: Family Medicine

## 2012-05-23 VITALS — BP 124/84 | HR 65 | Resp 16 | Ht 65.0 in | Wt 309.0 lb

## 2012-05-23 DIAGNOSIS — R609 Edema, unspecified: Secondary | ICD-10-CM

## 2012-05-23 DIAGNOSIS — I10 Essential (primary) hypertension: Secondary | ICD-10-CM

## 2012-05-23 NOTE — Progress Notes (Signed)
  Subjective:    Patient ID: Stefanie Taylor, female    DOB: 1955-12-18, 56 y.o.   MRN: [id number]  HPI Pt in today, has had 2 emergency room visits and one urgent care visit since last oV due to severe eczema/allergic reaction that has kept her out of work. 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 Has appt at [facility] in  derm upcoming. Most recently eye involved, saw opthalmology, got drops to use.Had to be out of work for some time due to symptom severity  triamcnolone0.1 cream and neosporin eczema is working best on her skin, in he opinion N weight loss success as of yet, not keen on surgical intervention, feels she " can do it on her own" and has been overweight all her life. Concerned about "hanging skin" should she lose a lot of weight. Interested in getting number to call for group session on surgical intervention. Still concerned about "leg swelling". Has had negative cardiac eval, concerned that diuretic in her bP med will need to be withdrawn.I  Advised sodium reduction, leg elevation and compression hose   Review of Systems See HPI Denies recent fever or chills. Denies sinus pressure, nasal congestion, ear pain or sore throat. Denies chest congestion, productive cough or wheezing. Denies chest pains, palpitations PND or orthopnea Denies abdominal pain, nausea, vomiting,diarrhea or constipation.   Denies dysuria, frequency, hesitancy or incontinence. Denies joint pain, swelling and limitation in mobility. Denies headaches, seizures, numbness, or tingling. Denies depression, anxiety or insomnia. Denies skin break down or rash.        Objective:   Physical Exam Patient alert and oriented and in no cardiopulmonary distress.  HEENT: No facial asymmetry, EOMI, no sinus tenderness,  oropharynx pink and moist.  Neck supple no adenopathy.  Chest: Clear to auscultation bilaterally.  CVS: S1, S2 no murmurs,  no S3.  ABD: Soft non tender. Bowel sounds normal.  Ext: No edema  MS: Adequate ROM spine, shoulders, hips and knees.  Skin: Intact, no ulcerations or rash noted.  Psych: Good eye contact, normal affect. Memory intact not anxious or depressed appearing.  CNS: CN 2-12 intact, power, tone and sensation normal throughout.        Assessment & Plan:

## 2012-05-23 NOTE — Patient Instructions (Addendum)
F/u in 6 weeks , call if you need me   You will need to change your blood pressure pill if indeed you have a sulphur allergy I will call as sson as I have the information  It is important that you exercise regularly at least 30 minutes 5 times a week. If you develop chest pain, have severe difficulty breathing, or feel very tired, stop exercising immediately and seek medical attention   A healthy diet is rich in fruit, vegetables and whole grains. Poultry fish, nuts and beans are a healthy choice for protein rather then red meat. A low sodium diet and drinking 64 ounces of water daily is generally recommended. Oils and sweet should be limited. Carbohydrates especially for those who are diabetic or overweight, should be limited to 34-45 gram per meal. It is important to eat on a regular schedule, at least 3 times daily. Snacks should be primarily fruits, vegetables or nuts.

## 2012-05-24 ENCOUNTER — Telehealth: Payer: Self-pay | Admitting: Family Medicine

## 2012-05-24 NOTE — Telephone Encounter (Signed)
Please contact dr lupton's nurse before sending the note over that I have written tohim, as I am asking for a response. I have reviewed info sent, pt gave me the opinion she was told she had a sulfa allergy, iut record review does not support this so i am seeking clarity from Dr Terri Piedra

## 2012-05-24 NOTE — Telephone Encounter (Signed)
Letter sent to dr. Dorita Sciara office with note needing clarification of supposed allergy

## 2012-05-25 ENCOUNTER — Other Ambulatory Visit: Payer: Self-pay

## 2012-05-25 ENCOUNTER — Telehealth: Payer: Self-pay | Admitting: Family Medicine

## 2012-05-25 MED ORDER — LISINOPRIL 20 MG PO TABS: 20.0000 mg | ORAL_TABLET | Freq: Every day | ORAL | Status: DC

## 2012-05-25 NOTE — Telephone Encounter (Signed)
Message left for return call

## 2012-05-25 NOTE — Telephone Encounter (Signed)
Advised pt that documentation from derm recommends discontinuing sulfa drugs based on possible sulfa allergy. She will stop current blood pressure med, use an aCe only, and practice sodium restriction and leg elevation for leg swelling

## 2012-05-29 NOTE — Assessment & Plan Note (Signed)
None evident on clinical exam at this visit

## 2012-05-29 NOTE — Assessment & Plan Note (Signed)
Controlled, however, will need to change medication to withdraw sulphur component

## 2012-05-29 NOTE — Assessment & Plan Note (Signed)
Deteriorated. Patient re-educated about  the importance of commitment to a  minimum of 150 minutes of exercise per week. The importance of healthy food choices with portion control discussed. Encouraged to start a food diary, count calories and to consider  joining a support group. Sample diet sheets offered. Goals set by the patient for the next several months.    

## 2012-05-30 ENCOUNTER — Other Ambulatory Visit: Payer: Self-pay | Admitting: *Deleted

## 2012-05-30 NOTE — Telephone Encounter (Signed)
Message left in her office for a return phone call.

## 2012-05-31 NOTE — Telephone Encounter (Signed)
Poke with patient today and she states that Dr Lodema Hong started her on lisinopril 20 mg for her blood pressure and she is no longer taking Lasix or HCTZ.

## 2012-06-26 ENCOUNTER — Telehealth: Payer: Self-pay | Admitting: Family Medicine

## 2012-06-26 NOTE — Telephone Encounter (Signed)
Please advise 

## 2012-06-26 NOTE — Telephone Encounter (Signed)
Please set appt with Dr Eden Emms , in [place] or [place], soonest available, for follow up on leg swelling and hypertension, in the setting of probable sulfa allergy. Please call her office and leave appt info with her legal assistant, she is expecting the call

## 2012-06-27 ENCOUNTER — Telehealth: Payer: Self-pay | Admitting: Family Medicine

## 2012-06-28 NOTE — Telephone Encounter (Signed)
Patient is aware 

## 2012-06-30 ENCOUNTER — Ambulatory Visit: Payer: BC Managed Care – PPO | Admitting: Internal Medicine

## 2012-07-05 ENCOUNTER — Ambulatory Visit: Payer: BC Managed Care – PPO | Admitting: Cardiology

## 2012-07-20 ENCOUNTER — Telehealth: Payer: Self-pay | Admitting: Family Medicine

## 2012-07-21 ENCOUNTER — Telehealth: Payer: Self-pay | Admitting: Family Medicine

## 2012-07-21 NOTE — Telephone Encounter (Signed)
Left message that letter was ready for collection

## 2012-07-21 NOTE — Telephone Encounter (Signed)
lettter written please type I will sign, let her know when done, collect or send, please

## 2012-07-21 NOTE — Telephone Encounter (Signed)
Noted - previous request for letter has been sent to Dr for review

## 2012-07-21 NOTE — Telephone Encounter (Signed)
I tried to call her back for more information but I do believe its just a letter of recommendation

## 2012-08-01 ENCOUNTER — Ambulatory Visit (INDEPENDENT_AMBULATORY_CARE_PROVIDER_SITE_OTHER): Payer: BC Managed Care – PPO | Admitting: Family Medicine

## 2012-08-01 ENCOUNTER — Encounter: Payer: Self-pay | Admitting: Family Medicine

## 2012-08-01 VITALS — BP 138/92 | HR 89 | Resp 15

## 2012-08-01 DIAGNOSIS — T7840XA Allergy, unspecified, initial encounter: Secondary | ICD-10-CM

## 2012-08-01 DIAGNOSIS — L309 Dermatitis, unspecified: Secondary | ICD-10-CM | POA: Insufficient documentation

## 2012-08-01 DIAGNOSIS — I10 Essential (primary) hypertension: Secondary | ICD-10-CM

## 2012-08-01 MED ORDER — PREDNISONE (PAK) 5 MG PO TABS: 5.0000 mg | ORAL_TABLET | ORAL | Status: DC

## 2012-08-01 MED ORDER — METHYLPREDNISOLONE ACETATE 80 MG/ML IJ SUSP
80.0000 mg | Freq: Once | INTRAMUSCULAR | Status: AC
  Administered 2012-08-01: 80 mg via INTRAMUSCULAR

## 2012-08-01 MED ORDER — LISINOPRIL 40 MG PO TABS: 40.0000 mg | ORAL_TABLET | Freq: Every day | ORAL | Status: DC

## 2012-08-01 NOTE — Progress Notes (Signed)
  Subjective:    Patient ID: Stefanie Taylor, female    DOB: Nov 13, 1955, 56 y.o.   MRN: [id number]  HPI  Pt repors that 6 days ago she started having swelling of her eye with increased clear drainage. She saw her opthalmologist the ollowing day who prescribed an eyedrop which she was unable to get before yesterday, states despite zyrtec, her condition progressively worsened and she again saw her opthalmologist yesterday and another eyedrop was prescribed, this with an antibiotic. She walked in today to be sen due to progressive swelling and redness of her face and eyes, denies difficulty swallowing or breathing, states she had a similar type of episode in July of this year. She has had allergy testing, the feeling is that she is allergic to sulfa, she has continued to take furosemide intermittently for leg swelling which resolves on its own after being in the recumbent position i again reminded her she should be on no diuretics, she needs to restrict sodium and elevate her legs for better control of the leg swelling. She denies any recent change in toiletries or cosmetics to explai the facial swelling  Review of Systems See HPI Denies recent fever or chills. Denies sinus pressure, nasal congestion, ear pain or sore throat. Denies chest congestion, productive cough or wheezing. Denies chest pains, palpitations and leg swelling  Denies skin break down or rash.        Objective:   Physical Exam Patient alert and oriented and in no cardiopulmonary distress.  HEENT: No facial asymmetry, EOMI, no sinus tenderness,  oropharynx pink and moist.  Neck supple no adenopathy.Oropharynx is not compromised, no swelling of tonsils or deviation of uvula  Chest: Clear to auscultation bilaterally.No wheezes  CVS: S1, S2 no murmurs, no S3.  ABD: Soft non tender. Bowel sounds normal.  Ext: No edema  MS: Adequate ROM spine, shoulders, hips and knees.  Skin: Intact, erythema of cheeks and severe  periorbital swelling, also swelling of cheeks noted Psych: Good eye contact, normal affect. Memory intact not anxious or depressed appearing.  CNS: CN 2-12 intact, power, normal throughout.        Assessment & Plan:

## 2012-08-01 NOTE — Patient Instructions (Addendum)
F/u i 4 to 5 weeks, call if you need me before  You have an allergic reaction causinf swelling and redness of the face.  Depo medrol 80mg  is administered in the office, also start the prednisone dose pack sent to the local pharmacy today.  Please take benadryl 25 to 50 mg at bedtime for the next 5 days   If swelling worsens, or difficulty swallowing or breathing go to nearest ED   STOP furosemide you have a sulfa allergy  Increase in lisinopril to 40mg  daily start tomorrow  chem7 non fasting in 5 weeks

## 2012-08-01 NOTE — Assessment & Plan Note (Signed)
Severe allergic reaction involving face and periorbital areas trigger unknown. Depo medrol in the office and prednisone dose pack also benadryl at bedtime Pt to go to Ed if condition worsens

## 2012-08-01 NOTE — Assessment & Plan Note (Signed)
Unchanged, plans to have surgery. Patient re-educated about  the importance of commitment to a  minimum of 150 minutes of exercise per week. The importance of healthy food choices with portion control discussed. Encouraged to start a food diary, count calories and to consider  joining a support group. Sample diet sheets offered. Goals set by the patient for the next several months.

## 2012-08-01 NOTE — Assessment & Plan Note (Signed)
Uncontrolled and pt still using lasix, she is to discontinue lasix and increase zestril dose F/u in 5 weeks, had been referred to cardiology in October , did not keep appt, states she was unaware

## 2012-08-02 ENCOUNTER — Telehealth: Payer: Self-pay | Admitting: Family Medicine

## 2012-08-02 ENCOUNTER — Ambulatory Visit (INDEPENDENT_AMBULATORY_CARE_PROVIDER_SITE_OTHER): Payer: BC Managed Care – PPO | Admitting: Surgery

## 2012-08-02 DIAGNOSIS — I10 Essential (primary) hypertension: Secondary | ICD-10-CM

## 2012-08-02 DIAGNOSIS — Z6841 Body Mass Index (BMI) 40.0 and over, adult: Secondary | ICD-10-CM

## 2012-08-02 NOTE — Telephone Encounter (Signed)
Called pt multiple times during regular work hours, and was able to spk with her around 7:55pm last night. Prednisone is entered as a drug which she is allergic to and I needed clarification, since during the OV when I specifically asked about prednisone, she stated she has taken it in the past with no problem, she re iterated she had used it for pneumonia so this was no problem, I had prescribed some earlier that day. I will therefore remove it from her allergy list. I also requested that she use OTC prilosec or zantac for GI protection with steroids for the week, she stated she has not had problems with stomach irritation in the past, and she was grateful for my concern.

## 2012-08-02 NOTE — Progress Notes (Signed)
Re:   Stefanie Taylor DOB:   03-May-1956 MRN:   [id number]  ASSESSMENT AND PLAN: 1.  Morbid Obesity, Weight 313, BMI - 52.1  Per the 1991 NIH Consensus Statement, the patient is a candidate for bariatric surgery.  The patient attended our initial information session and reviewed the types of bariatric surgery.    The patient is interested in the Roux en Y Gastric Bypass.  I discussed with the patient the indications and risks of bariatric surgery.  The potential risks of surgery include, but are not limited to, bleeding, infection, leak from the bowel, DVT and PE, open surgery, long term nutrition consequences, and death.  The patient understands the importance of compliance and long term follow-up with our group after surgery.  From here we will obtain lab tests, x-rays, nutrition consult, and psych consult.  Interestingly, she did not want to tel her husband, but I have insisted that it would be best that he knows.  Note, she declined her photo.  2.  Hypertension. 3.  Ankle swelling, with a history of remote ankle fracture. 4.  Allergies.  Chief Complaint  Patient presents with  . Weight Loss Surgery    Gastric by-Pass Initial   REFERRING PHYSICIAN: Syliva Overman, MD  HISTORY OF PRESENT ILLNESS: Stefanie Taylor is a 56 y.o. (DOB: [birth year])  AA female whose primary care physician is Syliva Overman, MD and comes to me today for considering bariatric surgery.  She heard Dr. Ezzard Standing talk at the bariatric information session.  The patient is interested in a gastric bypass. She has 2 clients whom have had the surgery and she has discussed it with them.  She has tried multiple diets through the years. She has tried Weight Watchers, portion control, high protein diet, and Weigh No More (at [facility]).  She probably did best with Weigh No More where she thinks that she lost 15 to 20 pounds.  She tried [place] as a diet pill many years ago, but it made her nervous and she could not  sleep.  She has not tried any OTC diet pills.  She has no history of stomach disease.  No history of liver disease.  No history of gall bladder disease.  No history of pancreas disease.  No history of colon disease.  She had a colonoscopy in 2004, her next exam is scheduled for 2014.   Past Medical History  Diagnosis Date  . Hypertension     Past Surgical History  Procedure Date  . Biopsy breast   . Breast surgery 1998    biopsy mole on right breast     Current Outpatient Prescriptions  Medication Sig Dispense Refill  . DiphenhydrAMINE HCl (BENADRYL ALLERGY PO) Take by mouth.      Marland Kitchen lisinopril (PRINIVIL,ZESTRIL) 40 MG tablet Take 1 tablet (40 mg total) by mouth daily.  30 tablet  5  . prednisoLONE acetate (PRED FORTE) 1 % ophthalmic suspension Place 1 drop into both eyes 2 (two) times daily.      . predniSONE (STERAPRED UNI-PAK) 5 MG TABS Take 1 tablet (5 mg total) by mouth as directed. Use as directed  21 tablet  0   No current facility-administered medications for this visit.   Facility-Administered Medications Ordered in Other Visits  Medication Dose Route Frequency Provider Last Rate Last Dose  . [COMPLETED] methylPREDNISolone acetate (DEPO-MEDROL) injection 80 mg  80 mg Intramuscular Once Kerri Perches, MD   80 mg at 08/01/12 1307      Allergies  Allergen Reactions  . Lasix (Furosemide) Itching  . Sulfur   . Ibuprofen Itching and Rash  . Penicillins Rash    REVIEW OF SYSTEMS: Skin:  No history of rash.  No history of abnormal moles. Infection:  No history of hepatitis or HIV.  No history of MRSA. Neurologic:  No history of stroke.  No history of seizure.  No history of headaches. Cardiac:  No history of hypertension. No history of heart disease.  No history of prior cardiac catheterization.  Saw Dr. Burna Forts who adjusted her BP meds because of swelling.  She found out that she was allergic to one "fluid pill".  She has had trouble with ankles swelling for at  least 5 years. Pulmonary:  Does not smoke cigarettes.  No asthma or bronchitis.  No OSA/CPAP.  Endocrine:  No diabetes. No thyroid disease.  She has a normal cholesterol and HgbA1C in the computer. Gastrointestinal:  See HPI. Urologic:  No history of kidney stones.  No history of bladder infections. Musculoskeletal:  Fractured left ankle x 2 and right ankle x 1.  Her last fx was her right ankle over 10 years ago. GYN:  LMP at age 69.  She has one daughter. Hematologic:  No bleeding disorder.  No history of anemia.  Not anticoagulated. Psycho-social:  The patient is oriented.   The patient has no obvious psychologic or social impairment to understanding our conversation and plan.  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 She is married.  She came by herself today.  She had planned not to tell her husband about the surgery, but I think she understands why he needs to be involved. Has one daughter, age 63.  PHYSICAL EXAM: BP 130/86  Pulse 60  Temp 97 F (36.1 C) (Temporal)  Resp 16  Ht 5\' 5"  (1.651 m)  Wt 306 lb 3.2 oz (138.891 kg)  BMI 50.95 kg/m2  General: WN AA F who is alert and generally healthy appearing.  HEENT: Normal. Pupils equal.  She wipes her eyes a lot because of allergies. Neck: Supple. No mass.  No thyroid mass. Lymph Nodes:  No supraclavicular or cervical nodes. Lungs: Clear to auscultation and symmetric breath sounds. Heart:  RRR. No murmur or rub. Abdomen: Soft. No mass. No tenderness. No hernia. Normal bowel sounds.  Scar at umbilicus (I think) for umbilical hernia repair.  She is more pear than apple shaped. Rectal: No mass and guaiac negative. Extremities:  Good strength and ROM  in upper and lower extremities. Neurologic:  Grossly intact to motor and sensory function. Psychiatric: Has normal mood and affect. Behavior is normal.   DATA REVIEWED: Some labs in Epic.  Ovidio Kin, MD,  [facility] [address] [place]  [address]   [place], [place]    [address] Phone:  [phone] FAX:  [phone]

## 2012-08-03 ENCOUNTER — Emergency Department (HOSPITAL_COMMUNITY)
Admission: EM | Admit: 2012-08-03 | Discharge: 2012-08-03 | Disposition: A | Discharge status: Home / Self Care | Payer: BC Managed Care – PPO | Attending: Emergency Medicine | Admitting: Emergency Medicine

## 2012-08-03 ENCOUNTER — Other Ambulatory Visit: Payer: Self-pay | Admitting: Family Medicine

## 2012-08-03 ENCOUNTER — Telehealth: Payer: Self-pay

## 2012-08-03 ENCOUNTER — Encounter (HOSPITAL_COMMUNITY): Payer: Self-pay | Admitting: Family Medicine

## 2012-08-03 DIAGNOSIS — T4995XA Adverse effect of unspecified topical agent, initial encounter: Secondary | ICD-10-CM | POA: Insufficient documentation

## 2012-08-03 DIAGNOSIS — T7840XA Allergy, unspecified, initial encounter: Secondary | ICD-10-CM

## 2012-08-03 DIAGNOSIS — H579 Unspecified disorder of eye and adnexa: Secondary | ICD-10-CM | POA: Insufficient documentation

## 2012-08-03 DIAGNOSIS — R220 Localized swelling, mass and lump, head: Secondary | ICD-10-CM | POA: Insufficient documentation

## 2012-08-03 DIAGNOSIS — H5789 Other specified disorders of eye and adnexa: Secondary | ICD-10-CM | POA: Insufficient documentation

## 2012-08-03 DIAGNOSIS — I10 Essential (primary) hypertension: Secondary | ICD-10-CM | POA: Insufficient documentation

## 2012-08-03 DIAGNOSIS — Z79899 Other long term (current) drug therapy: Secondary | ICD-10-CM | POA: Insufficient documentation

## 2012-08-03 DIAGNOSIS — R21 Rash and other nonspecific skin eruption: Secondary | ICD-10-CM | POA: Insufficient documentation

## 2012-08-03 MED ORDER — FAMOTIDINE 20 MG PO TABS
20.0000 mg | ORAL_TABLET | Freq: Once | ORAL | Status: AC
  Administered 2012-08-03: 20 mg via ORAL
  Filled 2012-08-03: qty 1

## 2012-08-03 MED ORDER — HYDROXYZINE HCL 25 MG PO TABS
25.0000 mg | ORAL_TABLET | Freq: Once | ORAL | Status: AC
  Administered 2012-08-03: 25 mg via ORAL
  Filled 2012-08-03: qty 1

## 2012-08-03 MED ORDER — METHYLPREDNISOLONE SODIUM SUCC 125 MG IJ SOLR
125.0000 mg | Freq: Once | INTRAMUSCULAR | Status: AC
  Administered 2012-08-03: 125 mg via INTRAVENOUS
  Filled 2012-08-03: qty 2

## 2012-08-03 MED ORDER — HYDROXYZINE HCL 25 MG PO TABS: 25.0000 mg | ORAL_TABLET | Freq: Four times a day (QID) | ORAL | Status: DC

## 2012-08-03 MED ORDER — FAMOTIDINE 20 MG PO TABS: 20.0000 mg | ORAL_TABLET | Freq: Two times a day (BID) | ORAL | Status: DC

## 2012-08-03 NOTE — Telephone Encounter (Signed)
Patient called and stated that she had to return to the ED yesterday for allergic reaction and she states she was told it was the prednisone that she was allergic to. I told her that she received IV steroids in the ED but she wanted you to know what she was told

## 2012-08-03 NOTE — Telephone Encounter (Signed)
Spoke with pt advised both allergist and dermatology appts since her face remains flushed, she has been told she is allergic to prednisone , and she agrees to call and make appts to be seen as soon as possible. I also advised her NOT to take the prednisone based on what she states she was told in the ED  I encouraged her to depend on benadryl for symptom control

## 2012-08-03 NOTE — ED Notes (Addendum)
Patient states she had an allergic reaction to Furosemide on Friday. States she started on Prednisone on Tuesday at 5pm taking 6 tabs, Wednesday she took 5 tabs. States she noticed facial erythema and edema at 0400; also reports itching.  States she has not been able to sleep. Denies respiratory distress.

## 2012-08-03 NOTE — ED Provider Notes (Signed)
History     CSN: [id number]  Arrival date & time 08/03/12  0441   First MD Initiated Contact with Patient 08/03/12 [phone]      Chief Complaint  Patient presents with  . Allergic Reaction   HPI  History provided by the patient. Patient is a 56 year old female with history of hypertension who presents with concerns for allergic reaction with rash and swelling to the face. Patient reports first having swelling and redness of her eyes with some discharge last week. She was seen by an ophthalmologist on Friday and given eyedrops. She was told she had an allergic reaction to something in the environment. On Monday patient followed up with PCP who was concerned her Lasix medication may be the cause of her rash and allergic reaction symptoms. She was told to discontinue this and was put on prednisone taper dose. Patient took a dose on Tuesday and again on Wednesday and has not had any significant improvements. She does state swelling around the eyes has improved some and she is able to open them much better. Currently she has redness and tightness to her cheeks and face area. There is also some burning sensation. There is still some pruritus generally throughout. She denies any other rash on the body. Denies any swollen lips, tongue or throat.    Past Medical History  Diagnosis Date  . Hypertension     Past Surgical History  Procedure Date  . Biopsy breast   . Breast surgery 1998    biopsy mole on right breast    Family History  Problem Relation Age of Onset  . Hypertension Mother   . Heart disease Mother   . Cancer Mother   . Hypertension Father   . Alcohol abuse Sister   . Hypertension Sister   . Diabetes Brother   . Hypertension Brother   . Diabetes Sister   . Hypertension Sister   . Diabetes Sister   . Hypertension Sister   . Diabetes Brother   . Hypertension Brother   . Diabetes Brother   . Hypertension Brother   . Diabetes Brother   . Hypertension Brother   .  Hypertension Brother     History  Substance Use Topics  . Smoking status: Never Smoker   . Smokeless tobacco: Not on file  . Alcohol Use: No    OB History    Grav Para Term Preterm Abortions TAB SAB Ect Mult Living                  Review of Systems  Constitutional: Negative for fever, chills and diaphoresis.  HENT: Positive for facial swelling. Negative for sore throat and trouble swallowing.   Eyes: Positive for discharge and itching.  Respiratory: Negative for cough, shortness of breath and wheezing.   Cardiovascular: Negative for chest pain and palpitations.  Gastrointestinal: Negative for nausea, vomiting, abdominal pain, diarrhea and constipation.  Skin: Positive for rash.  All other systems reviewed and are negative.    Allergies  Lasix; Prednisone; Sulfur; Ibuprofen; and Penicillins  Home Medications   Current Outpatient Rx  Name  Route  Sig  Dispense  Refill  . BENADRYL ALLERGY PO   Oral   Take 1 tablet by mouth 2 (two) times daily as needed. For itching         . FUROSEMIDE 20 MG PO TABS   Oral   Take 20 mg by mouth daily.         Marland Kitchen LISINOPRIL 20 MG PO  TABS   Oral   Take 20 mg by mouth daily.         Marland Kitchen PREDNISOLONE ACETATE 1 % OP SUSP   Both Eyes   Place 1 drop into both eyes 4 (four) times daily.          Marland Kitchen PREDNISONE (PAK) 5 MG PO TABS   Oral   Take 1 tablet (5 mg total) by mouth as directed. Use as directed   21 tablet   0     BP 167/72  Pulse 56  Temp 98.2 F (36.8 C) (Oral)  Resp 20  SpO2 100%  Physical Exam  Nursing note and vitals reviewed. Constitutional: She is oriented to person, place, and time. She appears well-developed and well-nourished. No distress.  HENT:  Head: Normocephalic.  Mouth/Throat: Oropharynx is clear and moist.       There is diffuse erythema of the face with mild/moderate swelling and tightness of the skin especially over the bilateral cheeks. Mild periorbital swelling. No active conjunctival  drainage.  Eyes: EOM are normal. Pupils are equal, round, and reactive to light. Right conjunctiva is injected. Left conjunctiva is injected.  Neck: Normal range of motion. Neck supple.  Cardiovascular: Normal rate and regular rhythm.   No murmur heard. Pulmonary/Chest: Effort normal and breath sounds normal. No stridor. No respiratory distress. She has no wheezes. She has no rales.  Abdominal: Soft. There is no tenderness. There is no rebound and no guarding.  Neurological: She is alert and oriented to person, place, and time.  Skin: Skin is warm and dry. Rash noted.  Psychiatric: She has a normal mood and affect. Her behavior is normal.    ED Course  Procedures      1. Allergic reaction       MDM  Patient seen and evaluated. Patient in no acute distress. Patient with normal respirations and O2 sats. No signs respiratory distress.  Patient was seen and evaluated with attending physician. At this time rash does not seem likely for erysipelas. Patient is well-appearing and nontoxic. Patient is afebrile. Patient has had improvement of swelling around the eyes with current treatments. At this time it is felt patient may return home and followup with her dermatology specialist.      Angus Seller, PA 08/04/12 0145

## 2012-08-04 NOTE — ED Provider Notes (Signed)
Medical screening examination/treatment/procedure(s) were conducted as a shared visit with non-physician practitioner(s) and myself.  I personally evaluated the patient during the encounter.  Pt with ongoing allergic reaction, had improved slightly but now with more face reddening, tightness.  No signs of airway involvement.  Will add hydroxyzine to help with itching.  Pt has pcm, allergist, dermatologist, ophthalmologist.  Will refer her back to her specialists  Olivia Mackie, MD 08/04/12 [phone]

## 2012-08-08 ENCOUNTER — Telehealth: Payer: Self-pay | Admitting: Cardiovascular Disease

## 2012-08-08 NOTE — Telephone Encounter (Signed)
Please advise 

## 2012-08-08 NOTE — Telephone Encounter (Signed)
Patient states that her Allergist (Dr.VanWinkle) with [facility] wants her to be switched off of Lisinopril due to excessive swelling.  Also states that she is no longer taking Furosemide. / tg

## 2012-08-09 ENCOUNTER — Other Ambulatory Visit: Payer: Self-pay | Admitting: *Deleted

## 2012-08-09 MED ORDER — LOSARTAN POTASSIUM 50 MG PO TABS: 50.0000 mg | ORAL_TABLET | Freq: Every day | ORAL | Status: DC

## 2012-08-09 NOTE — Telephone Encounter (Signed)
Please advise 

## 2012-08-09 NOTE — Telephone Encounter (Signed)
Recommendations called to patient.  Verbalized understanding. 

## 2012-08-09 NOTE — Telephone Encounter (Signed)
If stop lisinopril start cozaar 50 mg if ok with allergist

## 2012-08-15 ENCOUNTER — Encounter: Payer: BC Managed Care – PPO | Attending: Surgery | Admitting: *Deleted

## 2012-08-15 ENCOUNTER — Encounter: Payer: Self-pay | Admitting: *Deleted

## 2012-08-15 DIAGNOSIS — Z713 Dietary counseling and surveillance: Secondary | ICD-10-CM | POA: Insufficient documentation

## 2012-08-15 DIAGNOSIS — Z01818 Encounter for other preprocedural examination: Secondary | ICD-10-CM | POA: Insufficient documentation

## 2012-08-15 HISTORY — DX: Morbid (severe) obesity due to excess calories: E66.01

## 2012-08-15 NOTE — Progress Notes (Addendum)
  Pre-Op Assessment Visit:  Pre-Operative RYGB Surgery  Medical Nutrition Therapy:  Appt start time: 0845   End time:  0930.  Patient was seen on 08/15/2012 for Pre-Operative RYGB Nutrition Assessment. Assessment and letter of approval faxed to [facility] Bariatric Surgery Program coordinator on 08/15/2012.  Approval letter sent to [facility] and will be available in the chart under the media tab.  Handouts given during visit include:  Pre-Op Goals   Bariatric Surgery Protein Shakes  Samples given during visit include:   Premier Protein Shake: 1 ea Lot# [id number]; Exp: 07/01/13  Patient to call for Pre-Op and Post-Op Nutrition Education at the [facility] when surgery is scheduled.

## 2012-08-15 NOTE — Patient Instructions (Addendum)
   Follow Pre-Op Nutrition Goals to prepare for Gastric Bypass Surgery.   Call the [facility] at [phone] once you have been given your surgery date to enrolled in the Pre-Op Nutrition Class. You will need to attend this nutrition class 3-4 weeks prior to your surgery. 

## 2012-08-16 ENCOUNTER — Ambulatory Visit (HOSPITAL_COMMUNITY)
Admission: RE | Admit: 2012-08-16 | Discharge: 2012-08-16 | Disposition: A | Discharge status: Home / Self Care | Payer: BC Managed Care – PPO | Source: Ambulatory Visit | Attending: Surgery | Admitting: Surgery

## 2012-08-16 ENCOUNTER — Other Ambulatory Visit: Payer: Self-pay

## 2012-08-16 DIAGNOSIS — Z6841 Body Mass Index (BMI) 40.0 and over, adult: Secondary | ICD-10-CM | POA: Insufficient documentation

## 2012-08-16 DIAGNOSIS — I10 Essential (primary) hypertension: Secondary | ICD-10-CM | POA: Insufficient documentation

## 2012-08-21 ENCOUNTER — Ambulatory Visit (HOSPITAL_COMMUNITY)
Admission: RE | Admit: 2012-08-21 | Discharge: 2012-08-21 | Disposition: A | Discharge status: Home / Self Care | Payer: BC Managed Care – PPO | Source: Ambulatory Visit | Attending: Surgery | Admitting: Surgery

## 2012-08-28 LAB — COMPLETE METABOLIC PANEL WITH GFR
AST: 20 U/L (ref 0–37)
Albumin: 4 g/dL (ref 3.5–5.2)
Alkaline Phosphatase: 87 U/L (ref 39–117)
Potassium: 4.4 mEq/L (ref 3.5–5.3)
Sodium: 142 mEq/L (ref 135–145)
Total Protein: 6.7 g/dL (ref 6.0–8.3)

## 2012-08-30 ENCOUNTER — Encounter: Payer: Self-pay | Admitting: Family Medicine

## 2012-08-30 ENCOUNTER — Ambulatory Visit (INDEPENDENT_AMBULATORY_CARE_PROVIDER_SITE_OTHER): Payer: BC Managed Care – PPO | Admitting: Family Medicine

## 2012-08-30 VITALS — BP 148/92 | HR 61 | Resp 16

## 2012-08-30 DIAGNOSIS — T7840XA Allergy, unspecified, initial encounter: Secondary | ICD-10-CM

## 2012-08-30 DIAGNOSIS — I10 Essential (primary) hypertension: Secondary | ICD-10-CM

## 2012-08-30 MED ORDER — LOSARTAN POTASSIUM 100 MG PO TABS: 100.0000 mg | ORAL_TABLET | Freq: Every day | ORAL | Status: DC

## 2012-08-30 NOTE — Progress Notes (Signed)
  Subjective:    Patient ID: Stefanie Taylor, female    DOB: 10/17/1955, 56 y.o.   MRN: [id number]  HPI The PT is here for follow up and re-evaluation of chronic medical conditions, medication management and review of any available recent lab and radiology data.     Questions or concerns regarding consultations or procedures which the PT has had in the interim are  Addressed.Currently involved in pathway top bariatric surgery, which may occur by end february The PT denies any adverse reactions to current medications since the last visit.  There are no new concerns. Has had no recent skin outbreaks There are no specific complaints       Review of Systems See HPI Denies recent fever or chills. Denies sinus pressure, nasal congestion, ear pain or sore throat. Denies chest congestion, productive cough or wheezing. Denies chest pains, palpitations and leg swelling Denies abdominal pain, nausea, vomiting,diarrhea or constipation.   Denies dysuria, frequency, hesitancy or incontinence. Denies joint pain, swelling and limitation in mobility. Denies headaches, seizures, numbness, or tingling. Denies depression, anxiety or insomnia. Denies skin break down or rash.        Objective:   Physical Exam   Patient alert and oriented and in no cardiopulmonary distress.  HEENT: No facial asymmetry, EOMI, no sinus tenderness,  oropharynx pink and moist.  Neck supple no adenopathy.  Chest: Clear to auscultation bilaterally.  CVS: S1, S2 no murmurs, no S3.  ABD: Soft non tender. Bowel sounds normal.  Ext: No edema  MS: Adequate ROM spine, shoulders, hips and knees.  Skin: Intact, no ulcerations or rash noted.  Psych: Good eye contact, normal affect. Memory intact not anxious or depressed appearing.  CNS: CN 2-12 intact, power, tone and sensation normal throughout.      Assessment & Plan:

## 2012-08-30 NOTE — Patient Instructions (Addendum)
F/U in 2 month  Call if you need me before  Dose increase in cozaar to 100mg  daily, OK to take 50 mg TWO tablets together every day at the same time till done   Labs were excellent.  Blood pressure is still too high

## 2012-09-01 NOTE — Assessment & Plan Note (Signed)
Resolved, no recent occurence

## 2012-09-01 NOTE — Assessment & Plan Note (Signed)
Unchanged Patient re-educated about  the importance of commitment to a  minimum of 150 minutes of exercise per week. The importance of healthy food choices with portion control discussed. Plans to have surgical intervention in the next 2 to 3 month, has already started the process

## 2012-09-01 NOTE — Assessment & Plan Note (Signed)
Uncontrolled, increase cozaar DASH diet and commitment to daily physical activity for a minimum of 30 minutes discussed and encouraged, as a part of hypertension management. The importance of attaining a healthy weight is also discussed.

## 2012-09-04 ENCOUNTER — Ambulatory Visit (HOSPITAL_COMMUNITY)
Admission: RE | Admit: 2012-09-04 | Discharge: 2012-09-04 | Disposition: A | Discharge status: Home / Self Care | Payer: BC Managed Care – PPO | Source: Ambulatory Visit | Attending: Surgery | Admitting: Surgery

## 2012-09-04 ENCOUNTER — Encounter (HOSPITAL_COMMUNITY)
Admission: RE | Disposition: A | Discharge status: Home / Self Care | Payer: Self-pay | Source: Ambulatory Visit | Attending: Surgery

## 2012-09-04 ENCOUNTER — Telehealth: Payer: Self-pay | Admitting: Family Medicine

## 2012-09-04 HISTORY — PX: BREATH TEK H PYLORI: SHX5422

## 2012-09-04 SURGERY — BREATH TEST, FOR HELICOBACTER PYLORI

## 2012-09-04 NOTE — Telephone Encounter (Signed)
rx faxed

## 2012-09-04 NOTE — Telephone Encounter (Signed)
Pt needs to get compression stockings from [facility] please send rx

## 2012-09-04 NOTE — Telephone Encounter (Signed)
Script written, pls fax, let her know she needs to go to be fitted

## 2012-09-05 ENCOUNTER — Encounter (HOSPITAL_COMMUNITY): Payer: Self-pay | Admitting: Surgery

## 2012-09-19 ENCOUNTER — Telehealth (INDEPENDENT_AMBULATORY_CARE_PROVIDER_SITE_OTHER): Payer: Self-pay

## 2012-09-19 NOTE — Telephone Encounter (Signed)
Rx Helidac 1 po qid x 14 days [facility] [address] [place] [address] [phone] pt aware. Rx will ready for pick up 09/20/12 ; She will call Rivka Barbara or Okey Regal when she completes her ABT to schedule 2nd breath tek assessment

## 2012-09-22 ENCOUNTER — Encounter (INDEPENDENT_AMBULATORY_CARE_PROVIDER_SITE_OTHER): Payer: Self-pay | Admitting: General Surgery

## 2012-09-22 ENCOUNTER — Telehealth (INDEPENDENT_AMBULATORY_CARE_PROVIDER_SITE_OTHER): Payer: Self-pay

## 2012-09-22 NOTE — Progress Notes (Unsigned)
Patient ID: Stefanie Taylor, female   DOB: July 16, 1956, 57 y.o.   MRN: [id number] [facility] pharmacy called to verify the dosing of the Pylera.  I instructed the pharmacist to give her 3 capsules by mouth 4 times a day x10 days.

## 2012-09-22 NOTE — Telephone Encounter (Signed)
Informed pt [facility]  Does not have Abt  helidac . I iinformed her [facility] [address] has Abt  pylera in stock  She is going to pick it up on Saturday 09/23/12 She will call Okey Regal or myself when she completes it to have a [employer] assessment

## 2012-09-26 ENCOUNTER — Telehealth (INDEPENDENT_AMBULATORY_CARE_PROVIDER_SITE_OTHER): Payer: Self-pay

## 2012-09-26 NOTE — Telephone Encounter (Signed)
V/M - Patient to call if she had any further concerns. I hope her questions where answered through  My-chart.

## 2012-09-28 ENCOUNTER — Telehealth (INDEPENDENT_AMBULATORY_CARE_PROVIDER_SITE_OTHER): Payer: Self-pay

## 2012-09-28 NOTE — Telephone Encounter (Signed)
Pt states she is having acid reflux after taking her medication. I ask if she was taking it on an empty stmoach she said yes sometimes,also ask if she was taking any anti acid like Tums, or Prilosec she stated no I advised her to take her Pylera after meals along with  Tums 15-30 minutes before taking the Pylera. Advised her to call if no change.

## 2012-10-09 ENCOUNTER — Ambulatory Visit (HOSPITAL_COMMUNITY): Admit: 2012-10-09 | Payer: Self-pay | Admitting: Surgery

## 2012-10-09 ENCOUNTER — Encounter (HOSPITAL_COMMUNITY): Payer: Self-pay

## 2012-10-09 SURGERY — BREATH TEST, FOR HELICOBACTER PYLORI

## 2012-10-12 ENCOUNTER — Other Ambulatory Visit (INDEPENDENT_AMBULATORY_CARE_PROVIDER_SITE_OTHER): Payer: Self-pay

## 2012-10-12 DIAGNOSIS — A048 Other specified bacterial intestinal infections: Secondary | ICD-10-CM

## 2012-10-31 ENCOUNTER — Encounter: Payer: Self-pay | Admitting: Family Medicine

## 2012-10-31 ENCOUNTER — Ambulatory Visit (INDEPENDENT_AMBULATORY_CARE_PROVIDER_SITE_OTHER): Payer: BC Managed Care – PPO | Admitting: Family Medicine

## 2012-10-31 VITALS — BP 130/82 | HR 70 | Resp 18 | Ht 65.0 in

## 2012-10-31 DIAGNOSIS — M79672 Pain in left foot: Secondary | ICD-10-CM

## 2012-10-31 DIAGNOSIS — I10 Essential (primary) hypertension: Secondary | ICD-10-CM

## 2012-10-31 DIAGNOSIS — M79609 Pain in unspecified limb: Secondary | ICD-10-CM

## 2012-10-31 DIAGNOSIS — T7840XA Allergy, unspecified, initial encounter: Secondary | ICD-10-CM

## 2012-10-31 NOTE — Patient Instructions (Addendum)
F/u in 6 month, call if you need me before.  Blood pressure is excellent continue current medications.  Please check  With breast center for mammogram, seems as though past due   Call if you decide to have orthopedic evaluate the ankle  All the best with surgery

## 2012-10-31 NOTE — Assessment & Plan Note (Signed)
Controlled, no change in medication  

## 2012-11-05 NOTE — Assessment & Plan Note (Addendum)
No flares in past 2 month. Back to taking the ACE inhibitor by her own choice, as well as HCTZ

## 2012-11-05 NOTE — Assessment & Plan Note (Signed)
Unchanged, no interest i further eval despite recent injury, want to focus on gastric bypass

## 2012-11-05 NOTE — Progress Notes (Signed)
  Subjective:    Patient ID: Stefanie Taylor, female    DOB: Nov 19, 1955, 57 y.o.   MRN: [id number]  HPI The PT is here for follow up and re-evaluation of chronic medical conditions, specifically hypertension,medication management and review of any available recent lab and radiology data.  Preventive health is updated, specifically  Cancer screening and Immunization.   Questions or concerns regarding consultations or procedures which the PT has had in the interim are  Addressed.Recently treated for H ylori and needs re testing before date for bypass surgery is set. She is looking forward to this  The PT denies any adverse reactions to current medications since the last visit.  Didi sustain mild trauma to the foot in December increased left heel pain, but no interest i pursuing this at this time    Review of Systems See HPI Denies recent fever or chills. Denies sinus pressure, nasal congestion, ear pain or sore throat. Denies chest congestion, productive cough or wheezing. Denies chest pains, palpitations , pND or orthopnea Denies abdominal pain, nausea, vomiting,diarrhea or constipation.   Denies dysuria, frequency, hesitancy or incontinence. Denies headaches, seizures, numbness, or tingling. Denies depression, anxiety or insomnia. Denies skin break down or rash.        Objective:   Physical Exam Patient alert and oriented and in no cardiopulmonary distress.  HEENT: No facial asymmetry, EOMI, no sinus tenderness,  oropharynx pink and moist.  Neck supple no adenopathy.  Chest: Clear to auscultation bilaterally.  CVS: S1, S2 no murmurs, no S3.  ABD: Soft non tender. Bowel sounds normal.  Ext: No edema  MS: Adequate ROM spine, shoulders, hips and knees.  Skin: Intact, no ulcerations or rash noted.  Psych: Good eye contact, normal affect. Memory intact not anxious or depressed appearing.  CNS: CN 2-12 intact, power, tone and sensation normal throughout.         Assessment & Plan:

## 2012-11-06 ENCOUNTER — Ambulatory Visit (HOSPITAL_COMMUNITY)
Admission: RE | Admit: 2012-11-06 | Discharge: 2012-11-06 | Disposition: A | Discharge status: Home / Self Care | Payer: BC Managed Care – PPO | Source: Ambulatory Visit | Attending: Surgery | Admitting: Surgery

## 2012-11-06 ENCOUNTER — Other Ambulatory Visit: Payer: Self-pay | Admitting: Family Medicine

## 2012-11-06 ENCOUNTER — Encounter (HOSPITAL_COMMUNITY)
Admission: RE | Disposition: A | Discharge status: Home / Self Care | Payer: Self-pay | Source: Ambulatory Visit | Attending: Surgery

## 2012-11-06 DIAGNOSIS — A048 Other specified bacterial intestinal infections: Secondary | ICD-10-CM | POA: Insufficient documentation

## 2012-11-06 HISTORY — PX: BREATH TEK H PYLORI: SHX5422

## 2012-11-06 SURGERY — BREATH TEST, FOR HELICOBACTER PYLORI

## 2012-11-07 ENCOUNTER — Encounter (HOSPITAL_COMMUNITY): Payer: Self-pay | Admitting: Surgery

## 2012-11-09 ENCOUNTER — Ambulatory Visit: Payer: BC Managed Care – PPO

## 2012-12-07 ENCOUNTER — Ambulatory Visit (INDEPENDENT_AMBULATORY_CARE_PROVIDER_SITE_OTHER): Payer: BC Managed Care – PPO | Admitting: Surgery

## 2012-12-07 DIAGNOSIS — I10 Essential (primary) hypertension: Secondary | ICD-10-CM

## 2012-12-07 DIAGNOSIS — Z6841 Body Mass Index (BMI) 40.0 and over, adult: Secondary | ICD-10-CM

## 2012-12-07 NOTE — Progress Notes (Signed)
Re:   Stefanie Taylor DOB:   1956/07/19 MRN:   [id number]  ASSESSMENT AND PLAN: 1.  Morbid Obesity, Weight 313, BMI - 52.1  Per the 1991 NIH Consensus Statement, the patient is a candidate for bariatric surgery.  The patient attended our initial information session and reviewed the types of bariatric surgery.    The patient is interested in the Roux en Y Gastric Bypass.  I discussed with the patient the indications and risks of bariatric surgery.  The potential risks of surgery include, but are not limited to, bleeding, infection, leak from the bowel, DVT and PE, open surgery, long term nutrition consequences, and death.  The patient understands the importance of compliance and long term follow-up with our group after surgery.    Interestingly, she did not want to tell her husband, but I have insisted that it would be best that he knows.  She has talked to her husband about the surgery, but she did no bring him today.   She said that he was having stress at his job and had to go to a meeting.  Note, she has declined her photo.  I gave her a bowel prep.  Will schedule to see nutritionist for pre op diet.  She is scheduled for RYGB on 12/19/2012, but because of her not starting the pre op diet and not having her husband, I am going to delay her surgery.  It looks like there is a spot on 01/02/2013 - and I will see her one more time before surgery (with her husband).  2.  Hypertension. 3.  Left ankle swelling, with a history of remote ankle fracture.  Sees podiatrist, Dr. Charlsie Merles - he told her that she had a "spur",  no plans for surgery 4.  Allergies. 5.  Eczema  She gets meds from Dr. Terri Piedra which help her. 6. Penicillin allergy  This was over 20 years ago, she had "hives" (she thinks)  Chief Complaint  Patient presents with  . Bariatric Pre-op   REFERRING PHYSICIAN: Syliva Overman, MD  HISTORY OF PRESENT ILLNESS: Stefanie Taylor is a 57 y.o. (DOB: October 22, 1955)  AA female whose  primary care physician is Syliva Overman, MD and comes for preop evaluation for bariatric surgery.  The patient returns for her preop visit. There are couple of things that concern me - first she has gained weight since I last saw her.  She blames this on a worsening left ankle and that she cannot exercise because of her left ankle.  She said that she also used to swim, but that her eczema acted up - so she has not done this over a year.  She saw a podiatrist.  Secondly, she did not bring her husband, though she said he had a conflicting meeting.  Thirdly, she is unaware of the pre op diet.   I reviewed with her her findings on the pre op test.  She was H. Pylori positive and she was treated for this.  UGI - 08/16/2012 - Negative [place] - 08/16/2012 - Negative Psycg - S. Tomasa Rand, M.D. - 10/02/2012  Weight History: She heard Dr. Ezzard Standing talk at the bariatric information session.  The patient is interested in a gastric bypass. She has 2 clients whom have had the surgery and she has discussed it with them. She has tried multiple diets through the years. She has tried Weight Watchers, portion control, high protein diet, and Weigh No More (at [facility]).  She probably did best with Weigh No  More where she thinks that she lost 15 to 20 pounds. She tried [place] as a diet pill many years ago, but it made her nervous and she could not sleep.  She has not tried any OTC diet pills.  She has no history of stomach disease.  No history of liver disease.  No history of gall bladder disease.  No history of pancreas disease.  No history of colon disease.  She had a colonoscopy in 2004, her next exam is scheduled for 2014.   Past Medical History  Diagnosis Date  . Hypertension   . Morbid obesity with BMI of 50.0-59.9, adult 08/15/12    BMI: 53.1 kg/m^2    Past Surgical History  Procedure Laterality Date  . Biopsy breast    . Breast surgery  1998    biopsy mole on right breast  . Breath tek h pylori  09/04/2012     Procedure: BREATH TEK H PYLORI;  Surgeon: Kandis Cocking, MD;  Location: [facility];  Service: General;  Laterality: N/A;  . Breath tek h pylori N/A 11/06/2012    Procedure: BREATH TEK H PYLORI;  Surgeon: Kandis Cocking, MD;  Location: [facility];  Service: General;  Laterality: N/A;     Current Outpatient Prescriptions  Medication Sig Dispense Refill  . hydrochlorothiazide (HYDRODIURIL) 25 MG tablet Take 25 mg by mouth daily.      Marland Kitchen lisinopril (PRINIVIL,ZESTRIL) 20 MG tablet Take 20 mg by mouth daily.       No current facility-administered medications for this visit.      Allergies  Allergen Reactions  . Sulfur   . Prednisone   . Lasix (Furosemide) Itching and Swelling  . Ibuprofen Itching and Rash  . Penicillins Rash    REVIEW OF SYSTEMS: Skin:  Eczema - sees Dr. Alinda Sierras. Infection:  No history of hepatitis or HIV.  No history of MRSA. Neurologic:  No history of stroke.  No history of seizure.  No history of headaches. Cardiac:  No history of hypertension. No history of heart disease.  No history of prior cardiac catheterization.  Saw Dr. Burna Forts who adjusted her BP meds because of swelling.  She found out that she was allergic to one "fluid pill".  She has had trouble with ankles swelling for at least 5 years. Pulmonary:  Does not smoke cigarettes.  No asthma or bronchitis.  No OSA/CPAP.  Endocrine:  No diabetes. No thyroid disease.  She has a normal cholesterol and HgbA1C in the computer. Gastrointestinal:  See HPI. Urologic:  No history of kidney stones.  No history of bladder infections. Musculoskeletal:  Fractured left ankle x 2 and right ankle x 1.  Her last fx was her right ankle over 10 years ago. GYN:  LMP at age 84.  She has one daughter. Hematologic:  No bleeding disorder.  No history of anemia.  Not anticoagulated. Psycho-social:  The patient is oriented.   The patient has no obvious psychologic or social impairment to understanding our conversation and  plan.  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 She is married.  She came by herself today.  She had planned not to tell her husband about the surgery, but I think she understands why he needs to be involved. Has one daughter, age 33.  PHYSICAL EXAM: BP 136/76  Pulse 72  Temp(Src) 97.8 F (36.6 C)  Resp 18  Ht 5\' 5"  (1.651 m)  Wt 324 lb 6.4 oz (147.147 kg)  BMI 53.98 kg/m2  General: WN AA F who is alert and generally healthy appearing.  HEENT: Normal. Pupils equal.  She wipes her eyes a lot because of allergies. Neck: Supple. No mass.  No thyroid mass. Lymph Nodes:  No supraclavicular or cervical nodes. Lungs: Clear to auscultation and symmetric breath sounds. Heart:  RRR. No murmur or rub. Abdomen: Soft. No mass. No tenderness. No hernia. Normal bowel sounds.  Scar at umbilicus (I think) for umbilical hernia repair.  She is more pear than apple shaped.  Extremities:  Good strength and ROM  in upper and lower extremities.  Her left ankle is not swollen much today. Neurologic:  Grossly intact to motor and sensory function. Psychiatric: Has normal mood and affect. Behavior is normal.   DATA REVIEWED: Labs and reports in Epic.  Ovidio Kin, MD,  [facility] [address] [place]  [address]   [place], [place]    [address] Phone:  [phone] FAX:  [phone]

## 2012-12-14 ENCOUNTER — Encounter: Payer: BC Managed Care – PPO | Attending: Surgery

## 2012-12-20 ENCOUNTER — Encounter (HOSPITAL_COMMUNITY): Payer: Self-pay | Admitting: Pharmacy Technician

## 2012-12-22 NOTE — Patient Instructions (Signed)
Stefanie Taylor  12/22/2012   Your procedure is scheduled on:  01/02/13   Report [facility] at    0730 AM.  Call this number if you have problems the morning of surgery: [phone]   Remember:   Do not eat food or drink liquids after midnight.   Take these medicines the morning of surgery with A SIP OF WATER:    Do not wear jewelry, make-up or nail polish.  Do not wear lotions, powders, or perfumes.   Do not shave 48 hours prior to surgery.  Do not bring valuables to the hospital.  Contacts, dentures or bridgework may not be worn into surgery.  Leave suitcase in the car. After surgery it may be brought to your room.  For patients admitted to the hospital, checkout time is 11:00 AM the day of  discharge.     SEE CHG INSTRUCTION SHEET    Please read over the following fact sheets that you were given: MRSA Information, coughing and deep breathing exercises, leg exercises               Failure to comply with these instructions may result in cancellation of your surgery.                Patient Signature ____________________________              Nurse Signature _____________________________

## 2012-12-25 ENCOUNTER — Encounter (HOSPITAL_COMMUNITY)
Admission: RE | Admit: 2012-12-25 | Discharge: 2012-12-25 | Disposition: A | Discharge status: Home / Self Care | Payer: BC Managed Care – PPO | Source: Ambulatory Visit | Attending: Surgery | Admitting: Surgery

## 2012-12-25 ENCOUNTER — Encounter (HOSPITAL_COMMUNITY): Payer: Self-pay

## 2012-12-25 DIAGNOSIS — Z01812 Encounter for preprocedural laboratory examination: Secondary | ICD-10-CM | POA: Insufficient documentation

## 2012-12-25 LAB — CBC WITH DIFFERENTIAL/PLATELET
Basophils Absolute: 0 10*3/uL (ref 0.0–0.1)
Eosinophils Relative: 4 % (ref 0–5)
HCT: 40.5 % (ref 36.0–46.0)
Hemoglobin: 14 g/dL (ref 12.0–15.0)
Lymphocytes Relative: 35 % (ref 12–46)
Lymphs Abs: 1.3 10*3/uL (ref 0.7–4.0)
MCV: 81.3 fL (ref 78.0–100.0)
Monocytes Absolute: 0.4 10*3/uL (ref 0.1–1.0)
Monocytes Relative: 10 % (ref 3–12)
Neutro Abs: 1.9 10*3/uL (ref 1.7–7.7)
RBC: 4.98 MIL/uL (ref 3.87–5.11)
WBC: 3.7 10*3/uL — ABNORMAL LOW (ref 4.0–10.5)

## 2012-12-25 LAB — COMPREHENSIVE METABOLIC PANEL
AST: 18 U/L (ref 0–37)
BUN: 27 mg/dL — ABNORMAL HIGH (ref 6–23)
CO2: 30 mEq/L (ref 19–32)
Chloride: 104 mEq/L (ref 96–112)
Creatinine, Ser: 1.19 mg/dL — ABNORMAL HIGH (ref 0.50–1.10)
GFR calc Af Amer: 58 mL/min — ABNORMAL LOW (ref >90)
GFR calc non Af Amer: 50 mL/min — ABNORMAL LOW (ref >90)
Glucose, Bld: 93 mg/dL (ref 70–99)
Total Bilirubin: 0.5 mg/dL (ref 0.3–1.2)

## 2012-12-25 LAB — SURGICAL PCR SCREEN: Staphylococcus aureus: NEGATIVE

## 2012-12-25 NOTE — Progress Notes (Signed)
Your patient has screened at an elevated risk for Obstructive Sleep Apnea using the STOP-Bang Tool during a presurgical visit.  A score of 4 or greater is considered an elevated risk.   

## 2012-12-25 NOTE — Progress Notes (Signed)
CMP results routed via EPIC to Dr Ovidio Kin.

## 2012-12-28 ENCOUNTER — Ambulatory Visit (INDEPENDENT_AMBULATORY_CARE_PROVIDER_SITE_OTHER): Payer: BC Managed Care – PPO | Admitting: Surgery

## 2012-12-28 DIAGNOSIS — Z6841 Body Mass Index (BMI) 40.0 and over, adult: Secondary | ICD-10-CM

## 2012-12-28 NOTE — Progress Notes (Addendum)
Re:   Stefanie Taylor DOB:   1955/10/07 MRN:   [id number]  ASSESSMENT AND PLAN: 1.  Morbid Obesity, Weight 324, BMI - 53.9  Per the 1991 NIH Consensus Statement, the patient is a candidate for bariatric surgery.     The patient is interested in the Roux en Y Gastric Bypass.  I discussed with the patient the indications and risks of bariatric surgery.  The potential risks of surgery include, but are not limited to, bleeding, infection, leak from the bowel, DVT and PE, open surgery, long term nutrition consequences, and death.  The patient understands the importance of compliance and long term follow-up with our group after surgery.  Her surgery was delayed once for compliance issues - no nutrition visit pre op, not on pre op diet, and did not bring husband.   She is doing much better at this visit.  She has gotten her weight down - about 17 pounds since I last saw her.  She has seen Kasandra Knudsen and is on the pre op diet.  She brought her husband, whom I reviewed the surgery with.  I think she has a better understanding of what needs to be done.  She brought her signed permit with her.  I feel much more comfortable about going ahead with surgery next week.  We talked about being out of work about 4 weeks.  Her husband is from [place] and he is going to [place] about May 22 - so I told him, if she does well, she should be okay for him to leave.  She is scheduled for next Tuesday, 01/02/2013.  [The patient called 01/01/2013 and cancelled surgery. I left message on her answering machine at her office.  Interestingly, we do not have a home phone number.  DN  01/02/2013]  2.  Hypertension. 3.  Left ankle swelling, with a history of remote ankle fracture.  Sees podiatrist, Dr. Charlsie Merles - he told her that she had a "spur",  no plans for surgery 4.  Allergies. 5.  Eczema  She gets meds from Dr. Terri Piedra which help her. 6. Penicillin allergy  This was over 20 years ago, she had "hives" (she thinks)  Chief  Complaint  Patient presents with  . Bariatric Pre-op   REFERRING PHYSICIAN: Syliva Overman, MD  HISTORY OF PRESENT ILLNESS: Stefanie Taylor is a 57 y.o. (DOB: 20-Jan-1956)  AA female whose primary care physician is Syliva Overman, MD and comes for preop evaluation for bariatric surgery.  This time she comes better prepared.  She has her husband with her.  She has been on the diet and lost about 17 pounds.  She said that Odis Luster is coming this weekend, but I told her not to break her diet.  She has made good strides.  I spent about 20 minutes going over everything with her and her husband.  Weight History: She heard Dr. Ezzard Standing talk at the bariatric information session.  The patient is interested in a gastric bypass. She has 2 clients whom have had the surgery and she has discussed it with them. She has tried multiple diets through the years. She has tried Weight Watchers, portion control, high protein diet, and We[facility] No More (at [facility]).  She probably did best with Weigh No More where she thinks that she lost 15 to 20 pounds. She tried [place] as a diet pill many years ago, but it made her nervous and she could not sleep.  She has not tried any OTC diet pills.  She has no history of stomach disease.  No history of liver disease.  No history of gall bladder disease.  No history of pancreas disease.  No history of colon disease.  She had a colonoscopy in 2004, her next exam is scheduled for 2014. She was H. Pylori positive and she was treated for this. UGI - 08/16/2012 - Negative [place] - 08/16/2012 - Negative Psycg - S. Tomasa Rand, M.D. - 10/02/2012   Past Medical History  Diagnosis Date  . Hypertension   . Morbid obesity with BMI of 50.0-59.9, adult 08/15/12    BMI: 53.1 kg/m^2    Past Surgical History  Procedure Laterality Date  . Biopsy breast    . Breast surgery  1998    biopsy mole on right breast  . Breath tek h pylori  09/04/2012    Procedure: BREATH TEK H PYLORI;  Surgeon: Kandis Cocking, MD;  Location: [facility];  Service: General;  Laterality: N/A;  . Breath tek h pylori N/A 11/06/2012    Procedure: BREATH TEK Richardo Priest;  Surgeon: Kandis Cocking, MD;  Location: [facility];  Service: General;  Laterality: N/A;  . Umbilical hernia repair      age 8  . Tubal ligation       Current Outpatient Prescriptions  Medication Sig Dispense Refill  . cycloSPORINE (RESTASIS) 0.05 % ophthalmic emulsion 1 drop 2 (two) times daily.      . diphenhydrAMINE (BENADRYL) 25 mg capsule Take 25 mg by mouth every 6 (six) hours as needed for allergies.      . furosemide (LASIX) 20 MG tablet Take 20 mg by mouth daily.      Marland Kitchen ibuprofen (ADVIL,MOTRIN) 100 MG chewable tablet Chew 100 mg by mouth every 8 (eight) hours as needed for fever.      Marland Kitchen lisinopril (PRINIVIL,ZESTRIL) 20 MG tablet Take 20 mg by mouth every morning.       . Loteprednol Etabonate (LOTEMAX) 0.5 % OINT Apply 1 application to eye daily.       Marland Kitchen triamcinolone cream (KENALOG) 0.1 % Apply topically 2 (two) times daily. As needed       No current facility-administered medications for this visit.      Allergies  Allergen Reactions  . Sulfur   . Prednisone   . Lasix (Furosemide) Itching and Swelling  . Ibuprofen Itching and Rash  . Penicillins Rash    REVIEW OF SYSTEMS: Skin:  Eczema - sees Dr. Alinda Sierras. Infection:  No history of hepatitis or HIV.  No history of MRSA. Neurologic:  No history of stroke.  No history of seizure.  No history of headaches. Cardiac:  No history of hypertension. No history of heart disease.  No history of prior cardiac catheterization.  Saw Dr. Burna Forts who adjusted her BP meds because of swelling.  She found out that she was allergic to one "fluid pill".  She has had trouble with ankles swelling for at least 5 years. Pulmonary:  Does not smoke cigarettes.  No asthma or bronchitis.  No OSA/CPAP.  Endocrine:  No diabetes. No thyroid disease.  She has a normal cholesterol and HgbA1C in the  computer. Gastrointestinal:  See HPI. Urologic:  No history of kidney stones.  No history of bladder infections. Musculoskeletal:  Fractured left ankle x 2 and right ankle x 1.  Her last fx was her right ankle over 10 years ago. GYN:  LMP at age 7.  She has one daughter. Hematologic:  No bleeding  disorder.  No history of anemia.  Not anticoagulated. Psycho-social:  The patient is oriented.   The patient has no obvious psychologic or social impairment to understanding our conversation and plan.  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 She is married.  Her husband is from [place] - and came with her today. Has one daughter, age 44. Note, she has declined her photo.  PHYSICAL EXAM: BP 128/72  Pulse 63  Temp(Src) 98.6 F (37 C)  Resp 18  Ht 5\' 5"  (1.651 m)  Wt 307 lb 12.8 oz (139.617 kg)  BMI 51.22 kg/m2  General: WN AA F who is alert and generally healthy appearing.   I did not examine her today - she was here to review things with her husband.  I have a PE in Epic on 12/07/2012.   DATA REVIEWED: None new.  Ovidio Kin, MD,  [facility] [address] [place]  [address]   [place], [place]    [address] Phone:  [phone] FAX:  [phone]

## 2012-12-31 NOTE — Progress Notes (Signed)
Bariatric Class:  Appt start time: 1600 end time:  1700.  Pre-Operative Nutrition Class  Patient was seen on 12/14/12 for Pre-Operative Bariatric Surgery Education at [facility].   Surgery date: RYGB Surgery type: 01/02/13 (CANCELED) Start weight at [facility]:  318.9 lbs (08/15/12)   Weight today: 313.5 lbs Weight change: 5.4 lbs Total weight lost: 5.4 lbs  Samples given per MNT protocol: Bariatric Advantage Multivitamin Lot # [id number] Exp: 06/15  Bariatric Advantage Calcium Citrate Lot # [id number] Exp:10/15  Bariatric Advantage Sublingual B12 Lot # [id number] Exp: 10/15  Celebrate Vitamins Multivitamin Lot # [id number] Exp: 11/14  Celebrate Vitamins Calcium Citrate Lot # [id number] Exp: 09/15  Corliss Marcus Protein Powder Lot # [id number] Exp: 06/15  The following the learning objective met by the patient during this course:  Identifies Pre-Op Dietary Goals and will begin 2 weeks pre-operatively  Identifies appropriate sources of fluids and proteins   States protein recommendations and appropriate sources pre and post-operatively  Identifies Post-Operative Dietary Goals and will follow for 2 weeks post-operatively  Identifies appropriate multivitamin and calcium sources  Describes the need for physical activity post-operatively and will follow MD recommendations  States when to call healthcare provider regarding medication questions or post-operative complications  Handouts given during class include:  Pre-Op Bariatric Surgery Diet Handout  Protein Shake Handout  Post-Op Bariatric Surgery Nutrition Handout  BELT Program Information Flyer  Support Group Information Flyer  [facility] Bariatric Supplements Price List  Follow-Up Plan: Patient will follow-up at [facility] 2 weeks post operatively for diet advancement per MD.

## 2013-01-02 ENCOUNTER — Encounter (HOSPITAL_COMMUNITY): Admission: RE | Payer: Self-pay | Source: Ambulatory Visit

## 2013-01-02 ENCOUNTER — Inpatient Hospital Stay (HOSPITAL_COMMUNITY): Admission: RE | Admit: 2013-01-02 | Payer: BC Managed Care – PPO | Source: Ambulatory Visit | Admitting: Surgery

## 2013-01-02 SURGERY — LAPAROSCOPIC ROUX-EN-Y GASTRIC BYPASS WITH UPPER ENDOSCOPY
Anesthesia: General

## 2013-01-04 ENCOUNTER — Ambulatory Visit (INDEPENDENT_AMBULATORY_CARE_PROVIDER_SITE_OTHER): Payer: BC Managed Care – PPO | Admitting: Surgery

## 2013-01-18 ENCOUNTER — Ambulatory Visit (INDEPENDENT_AMBULATORY_CARE_PROVIDER_SITE_OTHER): Payer: BC Managed Care – PPO | Admitting: Surgery

## 2013-03-23 ENCOUNTER — Ambulatory Visit
Admission: RE | Admit: 2013-03-23 | Discharge: 2013-03-23 | Disposition: A | Discharge status: Home / Self Care | Payer: BC Managed Care – PPO | Source: Ambulatory Visit | Attending: Family Medicine | Admitting: Family Medicine

## 2013-03-23 DIAGNOSIS — Z1231 Encounter for screening mammogram for malignant neoplasm of breast: Secondary | ICD-10-CM

## 2013-04-30 ENCOUNTER — Other Ambulatory Visit: Payer: Self-pay | Admitting: Cardiology

## 2013-04-30 ENCOUNTER — Ambulatory Visit (INDEPENDENT_AMBULATORY_CARE_PROVIDER_SITE_OTHER): Payer: BC Managed Care – PPO | Admitting: Family Medicine

## 2013-04-30 ENCOUNTER — Encounter: Payer: Self-pay | Admitting: Family Medicine

## 2013-04-30 VITALS — BP 140/84 | HR 67 | Resp 16 | Ht 65.0 in | Wt 299.0 lb

## 2013-04-30 DIAGNOSIS — I10 Essential (primary) hypertension: Secondary | ICD-10-CM

## 2013-04-30 DIAGNOSIS — Z79899 Other long term (current) drug therapy: Secondary | ICD-10-CM

## 2013-04-30 DIAGNOSIS — M25561 Pain in right knee: Secondary | ICD-10-CM

## 2013-04-30 DIAGNOSIS — M25569 Pain in unspecified knee: Secondary | ICD-10-CM

## 2013-04-30 DIAGNOSIS — R609 Edema, unspecified: Secondary | ICD-10-CM

## 2013-04-30 MED ORDER — FUROSEMIDE 20 MG PO TABS: 20.0000 mg | ORAL_TABLET | Freq: Every day | ORAL | Status: DC

## 2013-04-30 MED ORDER — LISINOPRIL 20 MG PO TABS: 20.0000 mg | ORAL_TABLET | Freq: Every morning | ORAL | Status: DC

## 2013-04-30 NOTE — Assessment & Plan Note (Signed)
Managed with daily lasix

## 2013-04-30 NOTE — Assessment & Plan Note (Signed)
Improved. Pt applauded on succesful weight loss through lifestyle change, and encouraged to continue same. Weight loss goal set for the next several months.  

## 2013-04-30 NOTE — Progress Notes (Signed)
  Subjective:    Patient ID: Stefanie Taylor, female    DOB: 14-Nov-1955, 57 y.o.   MRN: [id number]  HPI The PT is here for follow up and re-evaluation of chronic medical conditions, medication management and review of any available recent lab and radiology data.  Preventive health is updated, specifically  Cancer screening and Immunization.  Thinks she may need her colonoscopy , will obtain last report and get back to [place] Questions or concerns regarding consultations or procedures which the PT has had in the interim are  Addressed.Pt was scheduled for gastric bypass, but states after she finally told her only child, just before the surgery, her daughter had a "melt down" so she cancelled the procedure. Now using a personal trainer 3 times per week , with the ope that she will be able to start walking soon, also reports eating 1200 alories daily, which she has been advised to increase by her trainer to help with weight loss, currently, no carbs The PT denies any adverse reactions to current medications since the last visit.  There are no new concerns.  There are no specific complaints . No recent episodes of urticaria      Review of Systems See HPI Denies recent fever or chills. Denies sinus pressure, nasal congestion, ear pain or sore throat. Denies chest congestion, productive cough or wheezing. Denies chest pains, palpitations and leg swelling Denies abdominal pain, nausea, vomiting,diarrhea or constipation.   Denies dysuria, frequency, hesitancy or incontinence. Bilateral knee pain  and limitation in mobility. Denies headaches, seizures, numbness, or tingling. Denies depression, anxiety or insomnia. Denies skin break down or rash.        Objective:   Physical Exam  Patient alert and oriented and in no cardiopulmonary distress.  HEENT: No facial asymmetry, EOMI, no sinus tenderness,  oropharynx pink and moist.  Neck supple no adenopathy.  Chest: Clear to auscultation  bilaterally.  CVS: S1, S2 no murmurs, no S3.  ABD: Soft non tender. Bowel sounds normal.  Ext: No edema  MS: Adequate ROM spine, shoulders, hips and knees.  Skin: Intact, no ulcerations or rash noted.  Psych: Good eye contact, normal affect. Memory intact not anxious or depressed appearing.  CNS: CN 2-12 intact, power, tone and sensation normal throughout.       Assessment & Plan:

## 2013-04-30 NOTE — Assessment & Plan Note (Signed)
Still a limiting factor for regular exercise, but working on this with a Systems analyst

## 2013-04-30 NOTE — Patient Instructions (Addendum)
F/u in December, please call if you need me before.  Weight loss goal of 4 pounds, continue healthy eating with caloric restriction, and regular physical activity.  No change in BP meds.  Fasting lipid, chem 7 , TSh, hBa1C as soon as possible.  Please drink water before the labs so you are not dehydrated.  Try aspirin 81mg  once daily (if not allergic ) for stroke risk reduction  Start calcium with vit D 1200mg /1000Iu one daily gel capsule for bone health  Will track last colonoscopy

## 2013-04-30 NOTE — Assessment & Plan Note (Signed)
Adequate though sub optimal control, no med change DASH diet and commitment to daily physical activity for a minimum of 30 minutes discussed and encouraged, as a part of hypertension management. The importance of attaining a healthy weight is also discussed.  

## 2013-07-19 ENCOUNTER — Other Ambulatory Visit: Payer: Self-pay

## 2013-08-24 ENCOUNTER — Ambulatory Visit (INDEPENDENT_AMBULATORY_CARE_PROVIDER_SITE_OTHER): Payer: BC Managed Care – PPO | Admitting: Family Medicine

## 2013-08-24 ENCOUNTER — Encounter: Payer: Self-pay | Admitting: Family Medicine

## 2013-08-24 VITALS — BP 138/82 | HR 73 | Resp 16 | Wt 296.0 lb

## 2013-08-24 DIAGNOSIS — R609 Edema, unspecified: Secondary | ICD-10-CM

## 2013-08-24 DIAGNOSIS — I10 Essential (primary) hypertension: Secondary | ICD-10-CM

## 2013-08-24 DIAGNOSIS — M25561 Pain in right knee: Secondary | ICD-10-CM

## 2013-08-24 DIAGNOSIS — R5381 Other malaise: Secondary | ICD-10-CM

## 2013-08-24 DIAGNOSIS — R7309 Other abnormal glucose: Secondary | ICD-10-CM

## 2013-08-24 DIAGNOSIS — M25569 Pain in unspecified knee: Secondary | ICD-10-CM

## 2013-08-24 DIAGNOSIS — R7302 Impaired glucose tolerance (oral): Secondary | ICD-10-CM

## 2013-08-24 DIAGNOSIS — J309 Allergic rhinitis, unspecified: Secondary | ICD-10-CM | POA: Insufficient documentation

## 2013-08-24 LAB — BASIC METABOLIC PANEL
BUN: 16 mg/dL (ref 6–23)
CO2: 30 mEq/L (ref 19–32)
Chloride: 106 mEq/L (ref 96–112)
Creat: 0.74 mg/dL (ref 0.50–1.10)
Potassium: 4.3 mEq/L (ref 3.5–5.3)

## 2013-08-24 LAB — HEMOGLOBIN A1C: Mean Plasma Glucose: 85 mg/dL (ref <117)

## 2013-08-24 MED ORDER — LORATADINE 10 MG PO TBDP: 10.0000 mg | ORAL_TABLET | Freq: Every day | ORAL | Status: DC

## 2013-08-24 MED ORDER — BENZONATATE 100 MG PO CAPS: 100.0000 mg | ORAL_CAPSULE | Freq: Three times a day (TID) | ORAL | Status: DC | PRN

## 2013-08-24 NOTE — Patient Instructions (Signed)
F/u in 4 month, please call if you need me before  Congrats on weight loss , keep it up  For head and chest congestion, pls take claritin one daily, and tesslon perles  One daily as needed for congestion  Blood pressure is excellent  Reconsider the fluvaccine  TSH, hBA1C, chem 7

## 2013-08-24 NOTE — Progress Notes (Signed)
   Subjective:    Patient ID: Stefanie Taylor, female    DOB: 1956-05-06, 57 y.o.   MRN: [id number]  HPI The PT is here for follow up and re-evaluation of chronic medical conditions, medication management and review of any available recent lab and radiology data.  Preventive health is updated, specifically  Cancer screening and Immunization.   Questions or concerns regarding consultations or procedures which the PT has had in the interim are  addressed. The PT denies any adverse reactions to current medications since the last visit.  3 day h/o fatigue and excessive nasal drainage and congestion, no fever or chills just increased allergy symptoms, life stresses ,and poor sleep Looking forward to increased exercise with recent knee injections, weight has also gradually trended dwon     Review of Systems    See HPI Denies recent fever or chills. Denies sinus pressure, nasal congestion, ear pain or sore throat. Denies chest congestion, productive cough or wheezing. Denies chest pains, palpitations and leg swelling Denies abdominal pain, nausea, vomiting,diarrhea or constipation.   Denies dysuria, frequency, hesitancy or incontinence. . Denies headaches, seizures, numbness, or tingling. Denies depression, anxiety or insomnia. Denies skin break down or rash.     Objective:   Physical Exam Patient alert and oriented and in no cardiopulmonary distress.  HEENT: No facial asymmetry, EOMI, no sinus tenderness,  oropharynx pink and moist.  Neck supple no adenopathy.  Chest: Clear to auscultation bilaterally.  CVS: S1, S2 no murmurs, no S3.  ABD: Soft non tender. Bowel sounds normal.  Ext: No edema  MS: Adequate ROM spine, shoulders, hips and reduced in  knees.  Skin: Intact, no ulcerations or rash noted.  Psych: Good eye contact, normal affect. Memory intact not anxious or depressed appearing.  CNS: CN 2-12 intact, power, tone and sensation normal throughout.          Assessment & Plan:

## 2013-08-25 ENCOUNTER — Encounter: Payer: Self-pay | Admitting: Family Medicine

## 2013-08-25 NOTE — Assessment & Plan Note (Signed)
Improved following 3 successive intra articular injections, weight loss also beneficial

## 2013-08-25 NOTE — Assessment & Plan Note (Signed)
Improved. Pt applauded on succesful weight loss through lifestyle change, and encouraged to continue same. Weight loss goal set for the next several months.  

## 2013-08-25 NOTE — Assessment & Plan Note (Signed)
Uncontrolled , start claritin daily 

## 2013-08-25 NOTE — Assessment & Plan Note (Signed)
Controlled, no change in medication DASH diet and commitment to daily physical activity for a minimum of 30 minutes discussed and encouraged, as a part of hypertension management. The importance of attaining a healthy weight is also discussed.  

## 2013-08-25 NOTE — Assessment & Plan Note (Signed)
Controlled with judicious use of lasix

## 2013-09-17 ENCOUNTER — Telehealth: Payer: Self-pay | Admitting: Family Medicine

## 2013-09-17 ENCOUNTER — Other Ambulatory Visit: Payer: Self-pay

## 2013-09-17 DIAGNOSIS — J309 Allergic rhinitis, unspecified: Secondary | ICD-10-CM

## 2013-09-17 MED ORDER — BENZONATATE 100 MG PO CAPS: 100.0000 mg | ORAL_CAPSULE | Freq: Three times a day (TID) | ORAL | Status: AC | PRN

## 2013-09-17 MED ORDER — AZITHROMYCIN 250 MG PO TABS: ORAL_TABLET | ORAL | Status: AC

## 2013-09-17 NOTE — Telephone Encounter (Signed)
I have sent  a z pack and tessalon perles already on her profile , refill x 1 if needed pls , let her know if worsens call for appt. Do saline flushes to nose twice dail and use sudafed 1 daily for next 3 days to reduce nasal congestion also

## 2013-09-17 NOTE — Telephone Encounter (Signed)
Patient states that she has had worsening sinus congestion x 5 days.  Productive cough with yellow sputum.  No fever.  Sinus headache.  If something is to be sent in she would like to use [employer].

## 2013-09-17 NOTE — Telephone Encounter (Signed)
Information given to Network engineer.  She will notify patient as she is in court at the moment.

## 2013-11-13 ENCOUNTER — Other Ambulatory Visit: Payer: Self-pay | Admitting: Family Medicine

## 2014-01-03 ENCOUNTER — Encounter: Payer: Self-pay | Admitting: Family Medicine

## 2014-01-03 ENCOUNTER — Ambulatory Visit (INDEPENDENT_AMBULATORY_CARE_PROVIDER_SITE_OTHER): Payer: BC Managed Care – PPO | Admitting: Family Medicine

## 2014-01-03 VITALS — BP 170/94 | HR 73 | Resp 16 | Wt 314.1 lb

## 2014-01-03 DIAGNOSIS — M25569 Pain in unspecified knee: Secondary | ICD-10-CM

## 2014-01-03 DIAGNOSIS — Z79899 Other long term (current) drug therapy: Secondary | ICD-10-CM

## 2014-01-03 DIAGNOSIS — I10 Essential (primary) hypertension: Secondary | ICD-10-CM

## 2014-01-03 DIAGNOSIS — M25562 Pain in left knee: Secondary | ICD-10-CM

## 2014-01-03 DIAGNOSIS — R7301 Impaired fasting glucose: Secondary | ICD-10-CM

## 2014-01-03 DIAGNOSIS — M25561 Pain in right knee: Secondary | ICD-10-CM

## 2014-01-03 MED ORDER — TRAMADOL HCL 50 MG PO TABS: ORAL_TABLET | ORAL | Status: DC

## 2014-01-03 MED ORDER — LISINOPRIL 40 MG PO TABS: 40.0000 mg | ORAL_TABLET | Freq: Every day | ORAL | Status: DC

## 2014-01-03 NOTE — Patient Instructions (Addendum)
F/u in 6 weeks , call if you need me before  Blood pressure uncontrolled, increase lisinopril  20 mg tab , take TWO daily till done, new dose is 40 mg ONE DAILY  Please change to mainly plant based feresh/frozen daily  Commit to 30 mins total every day of physical activit  Fasting lipid, chem 7 and HBA1C in 4 weeks  Tramadol is prescribe d for use before exercise , or as needed at bedtime for uncontrolled pain. Maximum of 2 per day

## 2014-01-05 NOTE — Progress Notes (Signed)
   Subjective:    Patient ID: Stefanie Taylor, female    DOB: Mar 14, 1956, 58 y.o.   MRN: [id number]  HPI The PT is here for follow up and re-evaluation of chronic medical conditions, medication management and review of any available recent lab and radiology data.  Preventive health is updated, specifically  Cancer screening and Immunization.   Questions or concerns regarding consultations or procedures which the PT has had in the interim are  Addressed.She is disappointed with the injections to knees, no improvement , unable to walk for exercise , which she enjoys, and she continues to gain weight  The PT denies any adverse reactions to current medications since the last visit.        Review of Systems See HPI Denies recent fever or chills. Denies sinus pressure, nasal congestion, ear pain or sore throat.Was treated for sinusitis since last visit Denies chest congestion, productive cough or wheezing. Denies chest pains, palpitations and leg swelling Denies abdominal pain, nausea, vomiting,diarrhea or constipation.   Denies dysuria, frequency, hesitancy or incontinence.  Denies headaches, seizures, numbness, or tingling. Denies depression, anxiety or insomnia. Denies skin break down or rash.        Objective:   Physical Exam BP 170/94  Pulse 73  Resp 16  Wt 314 lb 1.9 oz (142.484 kg)  SpO2 96% Patient alert and oriented and in no cardiopulmonary distress.  HEENT: No facial asymmetry, EOMI, no sinus tenderness,  oropharynx pink and moist.  Neck supple no adenopathy.  Chest: Clear to auscultation bilaterally.  CVS: S1, S2 no murmurs, no S3.  ABD: Soft non tender. Bowel sounds normal.  Ext: No edema  MS: Adequate ROM spine, shoulders, hips and reduced in  knees.  Skin: Intact, no ulcerations or rash noted.  Psych: Good eye contact, normal affect. Memory intact not anxious or depressed appearing.  CNS: CN 2-12 intact, power, tone and sensation normal  throughout.        Assessment & Plan:  HTN (hypertension) Uncontrolled, dose increase in zestril effective day of visit DASH diet and commitment to daily physical activity for a minimum of 30 minutes discussed and encouraged, as a part of hypertension management. The importance of attaining a healthy weight is also discussed.   Obesity, morbid Deteriorated. Patient re-educated about  the importance of commitment to a  minimum of 150 minutes of exercise per week. The importance of healthy food choices with portion control discussed. Encouraged to start a food diary, count calories and to consider  joining a support group. Sample diet sheets offered. Goals set by the patient for the next several months.     Knee pain, bilateral Uncontrolled, had 3 intra articular injections, no benefit. Depressed about being unable to walk which she enjoys, will try tramadol before physical activity to help wit this. lso needs to return to water exercise and stationary bike

## 2014-01-05 NOTE — Assessment & Plan Note (Signed)
Deteriorated. Patient re-educated about  the importance of commitment to a  minimum of 150 minutes of exercise per week. The importance of healthy food choices with portion control discussed. Encouraged to start a food diary, count calories and to consider  joining a support group. Sample diet sheets offered. Goals set by the patient for the next several months.    

## 2014-01-05 NOTE — Assessment & Plan Note (Signed)
Uncontrolled, dose increase in zestril effective day of visit DASH diet and commitment to daily physical activity for a minimum of 30 minutes discussed and encouraged, as a part of hypertension management. The importance of attaining a healthy weight is also discussed.

## 2014-01-05 NOTE — Assessment & Plan Note (Signed)
Uncontrolled, had 3 intra articular injections, no benefit. Depressed about being unable to walk which she enjoys, will try tramadol before physical activity to help wit this. lso needs to return to water exercise and stationary bike

## 2014-02-12 ENCOUNTER — Encounter: Payer: Self-pay | Admitting: Family Medicine

## 2014-02-12 ENCOUNTER — Ambulatory Visit (INDEPENDENT_AMBULATORY_CARE_PROVIDER_SITE_OTHER): Payer: BC Managed Care – PPO | Admitting: Family Medicine

## 2014-02-12 VITALS — BP 168/84 | HR 70 | Resp 18 | Ht 65.0 in | Wt 307.0 lb

## 2014-02-12 DIAGNOSIS — Z23 Encounter for immunization: Secondary | ICD-10-CM

## 2014-02-12 DIAGNOSIS — I10 Essential (primary) hypertension: Secondary | ICD-10-CM

## 2014-02-12 MED ORDER — SPIRONOLACTONE 25 MG PO TABS
25.0000 mg | ORAL_TABLET | Freq: Two times a day (BID) | ORAL | Status: DC
Start: 2014-02-12 — End: 2014-02-12

## 2014-02-12 MED ORDER — SPIRONOLACTONE 25 MG PO TABS: 25.0000 mg | ORAL_TABLET | Freq: Two times a day (BID) | ORAL | Status: DC

## 2014-02-12 MED ORDER — LISINOPRIL 20 MG PO TABS: 20.0000 mg | ORAL_TABLET | Freq: Every day | ORAL | Status: DC

## 2014-02-12 MED ORDER — MINOXIDIL 2.5 MG PO TABS: 2.5000 mg | ORAL_TABLET | Freq: Every day | ORAL | Status: DC

## 2014-02-12 MED ORDER — SPIRONOLACTONE 50 MG PO TABS: 50.0000 mg | ORAL_TABLET | Freq: Two times a day (BID) | ORAL | Status: DC

## 2014-02-12 NOTE — Patient Instructions (Signed)
F/u in 6 weeks , call if tyou need me before  Medication for your blood pressure is lisinopril 20mg  daily, spironolactone 25 mg twice daily and minoxidil 2.5mg  daily  Fasting lipid, cmp first week in July please, also CBC

## 2014-02-17 NOTE — Assessment & Plan Note (Signed)
Improved. Pt applauded on succesful weight loss through lifestyle change, and encouraged to continue same. Weight loss goal set for the next several months.  

## 2014-02-17 NOTE — Progress Notes (Signed)
   Subjective:    Patient ID: Stefanie Taylor, female    DOB: 02/24/1956, 58 y.o.   MRN: [id number]  HPI Pt in for review primarily of uncontrolled HTN . She is concerned that the in dose of zestril is causing hair loss and wants this cnnaged. She has started bike riding and has been successful with weight loss since last visit States under increased stress in the past 3 to 5 days which is detrimental to her health   Review of Systems See HPI Denies recent fever or chills. Denies sinus pressure, nasal congestion, ear pain or sore throat. Denies chest congestion, productive cough or wheezing. Denies chest pains, palpitations, pND or orthopnea, c/o chronic  leg swelling responds to lasix Cjronic  joint pain, swelling and limitation in mobility. Denies headaches, seizures, numbness, or tingling. Denies depression, anxiety or insomnia. Denies skin break down or rash.        Objective:   Physical Exam  BP 168/84  Pulse 70  Resp 18  Ht 5\' 5"  (1.651 m)  Wt 307 lb (139.254 kg)  BMI 51.09 kg/m2  SpO2 98% Patient alert and oriented and in no cardiopulmonary distress.  HEENT: No facial asymmetry, EOMI,   oropharynx pink and moist.  Neck supple no JVD, no mass.  Chest: Clear to auscultation bilaterally.  CVS: S1, S2 no murmurs, no S3.  ABD: Soft non tender.   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 Memory intact not anxious or depressed appearing.  CNS: CN 2-12 intact, power,  normal throughout.no focal deficits noted.       Assessment & Plan:  HTN (hypertension) Uncontrolled and pt feels that the higher dose of zestril is causing hair loss. Reduce zestril dose bakc to 20mg  , add spironolactone , chronic c/o leg swelling for which she has used lasix in the past , and add low dose minoxidil DASH diet and commitment to daily physical  activity for a minimum of 30 minutes discussed and encouraged, as a part of hypertension management. The importance of attaining a healthy weight is also discussed. Pt to call with adverse s/e and f/u in 6 to 8 weeks  Obesity, morbid Improved. Pt applauded on succesful weight loss through lifestyle change, and encouraged to continue same. Weight loss goal set for the next several months.

## 2014-02-17 NOTE — Assessment & Plan Note (Signed)
Uncontrolled and pt feels that the higher dose of zestril is causing hair loss. Reduce zestril dose bakc to 20mg  , add spironolactone , chronic c/o leg swelling for which she has used lasix in the past , and add low dose minoxidil DASH diet and commitment to daily physical activity for a minimum of 30 minutes discussed and encouraged, as a part of hypertension management. The importance of attaining a healthy weight is also discussed. Pt to call with adverse s/e and f/u in 6 to 8 weeks

## 2014-02-20 ENCOUNTER — Other Ambulatory Visit: Payer: Self-pay

## 2014-02-20 DIAGNOSIS — Z1231 Encounter for screening mammogram for malignant neoplasm of breast: Secondary | ICD-10-CM

## 2014-03-26 ENCOUNTER — Ambulatory Visit
Admission: RE | Admit: 2014-03-26 | Discharge: 2014-03-26 | Disposition: A | Discharge status: Home / Self Care | Payer: BC Managed Care – PPO | Source: Ambulatory Visit

## 2014-03-26 ENCOUNTER — Ambulatory Visit: Payer: BC Managed Care – PPO | Admitting: Family Medicine

## 2014-03-26 DIAGNOSIS — Z1231 Encounter for screening mammogram for malignant neoplasm of breast: Secondary | ICD-10-CM

## 2014-04-29 ENCOUNTER — Ambulatory Visit (INDEPENDENT_AMBULATORY_CARE_PROVIDER_SITE_OTHER): Payer: BC Managed Care – PPO | Admitting: Family Medicine

## 2014-04-29 ENCOUNTER — Encounter: Payer: Self-pay | Admitting: Family Medicine

## 2014-04-29 ENCOUNTER — Other Ambulatory Visit: Payer: Self-pay

## 2014-04-29 VITALS — BP 130/80 | HR 92 | Resp 18 | Ht 65.0 in

## 2014-04-29 DIAGNOSIS — Z23 Encounter for immunization: Secondary | ICD-10-CM

## 2014-04-29 DIAGNOSIS — I10 Essential (primary) hypertension: Secondary | ICD-10-CM

## 2014-04-29 MED ORDER — SPIRONOLACTONE 25 MG PO TABS: 25.0000 mg | ORAL_TABLET | Freq: Two times a day (BID) | ORAL | Status: DC

## 2014-04-29 MED ORDER — MINOXIDIL 2.5 MG PO TABS: 2.5000 mg | ORAL_TABLET | Freq: Every day | ORAL | Status: DC

## 2014-04-29 MED ORDER — LISINOPRIL 20 MG PO TABS: 20.0000 mg | ORAL_TABLET | Freq: Every day | ORAL | Status: DC

## 2014-04-29 NOTE — Patient Instructions (Signed)
F/u in December, call if you need me before   Excellent blood pressure today  Continue meds  Prevnar today

## 2014-04-29 NOTE — Assessment & Plan Note (Addendum)
Pneumonia vaccine today

## 2014-04-29 NOTE — Assessment & Plan Note (Signed)
Controlled, no change in medication DASH diet and commitment to daily physical activity for a minimum of 30 minutes discussed and encouraged, as a part of hypertension management. The importance of attaining a healthy weight is also discussed.  

## 2014-05-05 NOTE — Assessment & Plan Note (Signed)
Reports weight gain and refuses to weigh today It is important that you exercise regularly at least 30 minutes 5 times a week. If you develop chest pain, have severe difficulty breathing, or feel very tired, stop exercising immediately and seek medical attention  A healthy diet is rich in fruit, vegetables and whole grains. Poultry fish, nuts and beans are a healthy choice for protein rather then red meat. A low sodium diet and drinking 64 ounces of water daily is generally recommended. Oils and sweet should be limited. Carbohydrates especially for those who are diabetic or overweight, should be limited to 60-45 gram per meal. It is important to eat on a regular schedule, at least 3 times daily. Snacks should be primarily fruits, vegetables or nuts.

## 2014-05-05 NOTE — Progress Notes (Signed)
   Subjective:    Patient ID: Stefanie Taylor, female    DOB: 1956-07-24, 58 y.o.   MRN: [id number]  HPI Pt in for re eval of uncontrolled HTN Denies intolerance to meds, feels well. C/o inability to regularly exercise due to knee pain and swelling, frustrated with weight but intent on persevering in her effort   Review of Systems See HPI Denies recent fever or chills. Denies sinus pressure, nasal congestion, ear pain or sore throat. Denies chest congestion, productive cough or wheezing. Denies chest pains, palpitations and leg swelling Denies abdominal pain, nausea, vomiting,diarrhea or constipation.   Denies dysuria, frequency, hesitancy or incontinence. Denies headaches, seizures, numbness, or tingling. Denies depression, anxiety or insomnia. Denies skin break down or rash.        Objective:   Physical Exam  BP 130/80  Pulse 92  Resp 18  Ht 5\' 5"  (1.651 m)  SpO2 97% Patient alert and oriented and in no cardiopulmonary distress.  HEENT: No facial asymmetry, EOMI,   oropharynx pink and moist.  Neck supple no JVD, no mass.  Chest: Clear to auscultation bilaterally.  CVS: S1, S2 no murmurs, no S3.Regular rate.  ABD: Soft non tender.   Ext: No edema    CNS: CN 2-12 intact, power,  normal throughout.no focal deficits noted.       Assessment & Plan:  HTN (hypertension) Controlled, no change in medication DASH diet and commitment to daily physical activity for a minimum of 30 minutes discussed and encouraged, as a part of hypertension management. The importance of attaining a healthy weight is also discussed.   Need for vaccination with 13-polyvalent pneumococcal conjugate vaccine Pneumonia vaccine  today  Morbid obesity, Weight - 324, BMI - 53.9. Reports weight gain and refuses to weigh today It is important that you exercise regularly at least 30 minutes 5 times a week. If you develop chest pain, have severe difficulty breathing, or feel very tired,  stop exercising immediately and seek medical attention  A healthy diet is rich in fruit, vegetables and whole grains. Poultry fish, nuts and beans are a healthy choice for protein rather then red meat. A low sodium diet and drinking 64 ounces of water daily is generally recommended. Oils and sweet should be limited. Carbohydrates especially for those who are diabetic or overweight, should be limited to 60-45 gram per meal. It is important to eat on a regular schedule, at least 3 times daily. Snacks should be primarily fruits, vegetables or nuts.

## 2014-06-03 ENCOUNTER — Telehealth: Payer: Self-pay | Admitting: Family Medicine

## 2014-06-04 NOTE — Telephone Encounter (Signed)
Left message with receptionist to have her call me back

## 2014-06-05 NOTE — Telephone Encounter (Signed)
Had a question about what her meds were for. Patient aware

## 2014-06-06 ENCOUNTER — Encounter: Payer: Self-pay | Admitting: Podiatry

## 2014-06-06 ENCOUNTER — Ambulatory Visit (INDEPENDENT_AMBULATORY_CARE_PROVIDER_SITE_OTHER): Payer: BC Managed Care – PPO

## 2014-06-06 ENCOUNTER — Ambulatory Visit (INDEPENDENT_AMBULATORY_CARE_PROVIDER_SITE_OTHER): Payer: BC Managed Care – PPO | Admitting: Podiatry

## 2014-06-06 VITALS — BP 145/85 | HR 64 | Resp 17

## 2014-06-06 DIAGNOSIS — R52 Pain, unspecified: Secondary | ICD-10-CM

## 2014-06-06 DIAGNOSIS — M7750 Other enthesopathy of unspecified foot: Secondary | ICD-10-CM

## 2014-06-06 DIAGNOSIS — M898X9 Other specified disorders of bone, unspecified site: Secondary | ICD-10-CM

## 2014-06-06 MED ORDER — TRIAMCINOLONE ACETONIDE 10 MG/ML IJ SUSP
10.0000 mg | Freq: Once | INTRAMUSCULAR | Status: AC
  Administered 2014-06-06: 10 mg

## 2014-06-06 NOTE — Progress Notes (Signed)
   Subjective:    Patient ID: Stefanie Taylor, female    DOB: [birth year], 58 y.o.   MRN: [id number]  HPI  Pt presents with right foot pain, pain is on top of her foot, she states that a rock hit her foot while she was swimming on 05/29/14 and the pain is worsening since accident.  Review of Systems     Objective:   Physical Exam        Assessment & Plan:

## 2014-06-07 NOTE — Progress Notes (Signed)
Subjective:     Patient ID: Stefanie Taylor, female   DOB: 24-Jul-1956, 59 y.o.   MRN: [id number]  Foot Pain   patient presents stating that her foot has been hurting her for the last couple weeks after trauma which occurred. She is obese which is a complicating factor for this type of an inflammatory process   Review of Systems  All other systems reviewed and are negative.      Objective:   Physical Exam  Nursing note and vitals reviewed. Constitutional: She is oriented to person, place, and time.  Cardiovascular: Intact distal pulses.   Musculoskeletal: Normal range of motion.  Neurological: She is oriented to person, place, and time.  Skin: Skin is warm.   neurovascular status is intact with muscle strength adequate and range of motion of the subtalar and midtarsal joint noted. Patient is found to have edema in discomfort in the dorsal lateral aspect of the right foot with inflammation and fluid around this area and is noted to have good digital perfusion with moderate diminishment of arch upon evaluation     Assessment:     Tendinitis right foot secondary to injury with obesity is complicating factor    Plan:     H&P and x-rays reviewed. Injected the dorsal lateral aspect of the foot 3 mg Kenalog 5 mg Xylocaine and instructed on physical therapy and reduced activity. Reappoint her recheck

## 2014-06-14 ENCOUNTER — Other Ambulatory Visit: Payer: Self-pay | Admitting: Family Medicine

## 2014-06-20 ENCOUNTER — Ambulatory Visit: Payer: BC Managed Care – PPO | Admitting: Podiatry

## 2014-08-19 ENCOUNTER — Telehealth: Payer: Self-pay | Admitting: Family Medicine

## 2014-08-19 DIAGNOSIS — I10 Essential (primary) hypertension: Secondary | ICD-10-CM

## 2014-08-19 DIAGNOSIS — Z13220 Encounter for screening for lipoid disorders: Secondary | ICD-10-CM

## 2014-08-19 NOTE — Telephone Encounter (Signed)
Pls contact pt, needs fasting lipid, chem 7, CBc, TSh , past due. Offer flu vaccine.  Needs f/u appt by feb 2016, pls let her know, none in system!

## 2014-08-26 NOTE — Telephone Encounter (Signed)
Labs ordered, called and left message to call back

## 2014-08-26 NOTE — Addendum Note (Signed)
Addended by: Eual Fines on: 08/26/2014 02:53 PM   Modules accepted: Orders

## 2014-08-27 ENCOUNTER — Telehealth: Payer: Self-pay | Admitting: *Deleted

## 2014-08-27 NOTE — Telephone Encounter (Signed)
Pt appt scheduled, mailed lab order to her

## 2014-08-27 NOTE — Telephone Encounter (Signed)
Pt called returning Brandi's call. Please advise

## 2014-09-12 ENCOUNTER — Other Ambulatory Visit: Payer: Self-pay | Admitting: Family Medicine

## 2014-10-17 ENCOUNTER — Other Ambulatory Visit: Payer: Self-pay | Admitting: Family Medicine

## 2014-10-23 ENCOUNTER — Ambulatory Visit (INDEPENDENT_AMBULATORY_CARE_PROVIDER_SITE_OTHER): Payer: BC Managed Care – PPO

## 2014-10-23 ENCOUNTER — Ambulatory Visit (INDEPENDENT_AMBULATORY_CARE_PROVIDER_SITE_OTHER): Payer: BC Managed Care – PPO | Admitting: Podiatry

## 2014-10-23 ENCOUNTER — Encounter: Payer: Self-pay | Admitting: Podiatry

## 2014-10-23 VITALS — BP 128/63 | HR 86 | Resp 16

## 2014-10-23 DIAGNOSIS — M7661 Achilles tendinitis, right leg: Secondary | ICD-10-CM

## 2014-10-23 DIAGNOSIS — M779 Enthesopathy, unspecified: Secondary | ICD-10-CM

## 2014-10-23 DIAGNOSIS — M722 Plantar fascial fibromatosis: Secondary | ICD-10-CM

## 2014-10-23 MED ORDER — DICLOFENAC SODIUM 75 MG PO TBEC: 75.0000 mg | DELAYED_RELEASE_TABLET | Freq: Two times a day (BID) | ORAL | Status: DC

## 2014-10-23 MED ORDER — TRIAMCINOLONE ACETONIDE 10 MG/ML IJ SUSP
10.0000 mg | Freq: Once | INTRAMUSCULAR | Status: AC
  Administered 2014-10-23: 10 mg

## 2014-10-23 NOTE — Progress Notes (Signed)
Subjective:     Patient ID: Stefanie Taylor, female   DOB: December 24, 1955, 59 y.o.   MRN: [id number]  HPI obese female with chronic foot issues who is developed acute plantar fasciitis right change in gait and a lot of pain in the outside of the right ankle with collapse and medial longitudinal arch noted bilateral. States the pain is very bad at night and that she has trouble sleeping   Review of Systems     Objective:   Physical Exam Neurovascular status is discussed with the patient and health history. Depression of the arch noted bilateral with exquisite discomfort in the plantar right heel and in the lateral ankle gutter of the right tarsi with fluid buildup noted. Patient's pain is worse during the night and when getting up in the morning    Assessment:     Acute plantar fasciitis right with compensatory sinus tarsitis inflammation right    Plan:     H&P and x-rays reviewed and today I injected both the plantar fascia right and the lateral ankle gutter with 3 mg Kenalog 5 mg Xylocaine and dispensed night splint with instructions on usage. Her orthotics or 60 years old so we did go ahead today and scanned her for new orthotics to try to improve her arch support and take stress off her feet

## 2014-10-30 ENCOUNTER — Encounter: Payer: Self-pay | Admitting: Family Medicine

## 2014-10-30 ENCOUNTER — Telehealth: Payer: Self-pay | Admitting: Family Medicine

## 2014-10-30 ENCOUNTER — Other Ambulatory Visit: Payer: Self-pay | Admitting: Family Medicine

## 2014-10-30 DIAGNOSIS — E875 Hyperkalemia: Secondary | ICD-10-CM

## 2014-10-30 LAB — CBC WITH DIFFERENTIAL/PLATELET
BASOS PCT: 0 % (ref 0–1)
Basophils Absolute: 0 10*3/uL (ref 0.0–0.1)
EOS ABS: 0.2 10*3/uL (ref 0.0–0.7)
Eosinophils Relative: 4 % (ref 0–5)
HEMATOCRIT: 40.1 % (ref 36.0–46.0)
Hemoglobin: 13.1 g/dL (ref 12.0–15.0)
LYMPHS ABS: 1.6 10*3/uL (ref 0.7–4.0)
Lymphocytes Relative: 32 % (ref 12–46)
MCH: 29.3 pg (ref 26.0–34.0)
MCHC: 32.7 g/dL (ref 30.0–36.0)
MCV: 89.7 fL (ref 78.0–100.0)
MONOS PCT: 10 % (ref 3–12)
MPV: 11 fL (ref 9.4–12.4)
Monocytes Absolute: 0.5 10*3/uL (ref 0.1–1.0)
Neutro Abs: 2.8 10*3/uL (ref 1.7–7.7)
Neutrophils Relative %: 54 % (ref 43–77)
Platelets: 226 10*3/uL (ref 150–400)
RBC: 4.47 MIL/uL (ref 3.87–5.11)
RDW: 13.5 % (ref 11.5–15.5)
WBC: 5.1 10*3/uL (ref 4.0–10.5)

## 2014-10-30 LAB — BASIC METABOLIC PANEL
BUN: 20 mg/dL (ref 6–23)
CALCIUM: 9.7 mg/dL (ref 8.4–10.5)
CO2: 29 mEq/L (ref 19–32)
CREATININE: 0.89 mg/dL (ref 0.50–1.10)
Chloride: 100 mEq/L (ref 96–112)
Glucose, Bld: 90 mg/dL (ref 70–99)
Potassium: 6.1 mEq/L — ABNORMAL HIGH (ref 3.5–5.3)
SODIUM: 139 meq/L (ref 135–145)

## 2014-10-30 LAB — LIPID PANEL
Cholesterol: 169 mg/dL (ref 0–200)
HDL: 59 mg/dL (ref >39)
LDL CALC: 98 mg/dL (ref 0–99)
Total CHOL/HDL Ratio: 2.9 Ratio
Triglycerides: 62 mg/dL (ref <150)
VLDL: 12 mg/dL (ref 0–40)

## 2014-10-30 LAB — TSH: TSH: 2.384 u[IU]/mL (ref 0.350–4.500)

## 2014-10-30 NOTE — Telephone Encounter (Signed)
Message left on available number requesting pt page me, she needs to d/c anti inflammatory med , kayexalate x 60 gm total prescribed , needs rept potassium as stat on Friday am. Has appt in office in am, will address at visit since unable to directly spk with pt at this time

## 2014-10-31 ENCOUNTER — Encounter: Payer: Self-pay | Admitting: Family Medicine

## 2014-10-31 ENCOUNTER — Ambulatory Visit (INDEPENDENT_AMBULATORY_CARE_PROVIDER_SITE_OTHER): Payer: BC Managed Care – PPO | Admitting: Family Medicine

## 2014-10-31 VITALS — BP 128/74 | HR 86 | Resp 16 | Ht 65.0 in

## 2014-10-31 DIAGNOSIS — Z23 Encounter for immunization: Secondary | ICD-10-CM

## 2014-10-31 DIAGNOSIS — I10 Essential (primary) hypertension: Secondary | ICD-10-CM

## 2014-10-31 DIAGNOSIS — E875 Hyperkalemia: Secondary | ICD-10-CM | POA: Insufficient documentation

## 2014-10-31 MED ORDER — SODIUM POLYSTYRENE SULFONATE PO POWD: ORAL | Status: DC

## 2014-10-31 NOTE — Patient Instructions (Signed)
F/u in 4 month, pls call if you need me before  Blood pressure is excellent  Zostavax today   You ar e referrerd for colonoscopy, [facility]  Labs are excellent, not diabetic, bone marrow function normal, thyroid function normal, kidney function normal, not anemic, cholesterol is excellent  Potassium is too high and needs to be corrected.  Medication is sent to [facility], take as directed today, TWO doses total, as prescribed today   Please Go to lab in the morning, OK to have breakfast before and ensure that you commit to 64 ounces water every day as a part of discipline in eating  We will call you within 4  Hours of blood draw to let you know about your potassium this is very important  Please get another blood test next week Wednesday morning  non fasting to ensure that your potassium is normal on your current medication, again we will call next week Wednesday with a f/u on the blood test, important that you get this information  Only tylenol for pain please, and water exercises, and// or bike riding as able  A third lab sheet will be given to you for  Fasting chem 7 to be drawn for your 4 month follow up  When on medication , like you are , keeping a check on kidney function, , potassium and sodium on a regular basis is very important

## 2014-10-31 NOTE — Progress Notes (Signed)
   Subjective:    Patient ID: Stefanie Taylor, female    DOB: 02-15-1956, 59 y.o.   MRN: [id number]  HPI The PT is here for follow up and re-evaluation of chronic medical conditions, medication management and review of any available recent lab and radiology data.  Preventive health is updated, specifically  Cancer screening and Immunization.   Recently say podiatrist who recoommended NSAID to help alleviate foot pain, unfortunately tends to cause hyperkalemi with spironolactone hence advised her not to fill The PT denies any adverse reactions to current medications since the last visit.  Ongoing weight gin though "eats little"      Review of Systems    See HPI Denies recent fever or chills. Denies sinus pressure, nasal congestion, ear pain or sore throat. Denies chest congestion, productive cough or wheezing. Denies chest pains, palpitations and leg swelling Denies abdominal pain, nausea, vomiting,diarrhea or constipation.   Denies dysuria, frequency, hesitancy or incontinence. Denies headaches, seizures, numbness, or tingling. . Denies skin break down or rash.     Objective:   Physical Exam BP 128/74 mmHg  Pulse 86  Resp 16  Ht 5\' 5"  (1.651 m)  SpO2 97% Patient alert and oriented and in no cardiopulmonary distress.  HEENT: No facial asymmetry, EOMI,   oropharynx pink and moist.  Neck supple no JVD, no mass.  Chest: Clear to auscultation bilaterally.  CVS: S1, S2 no murmurs, no S3.Regular rate.  ABD: Soft non tender.   Ext: No edema  MS: Adequate though reduced  ROM spine, shoulders, hips and knees.  Skin: Intact, no ulcerations or rash noted.  Psych: Good eye contact, normal affect. Memory intact not anxious or depressed appearing.  CNS: CN 2-12 intact, power,  normal throughout.no focal deficits noted.        Assessment & Plan:  HTN (hypertension) Controlled, no change in medication DASH diet and commitment to daily physical activity for a minimum  of 30 minutes discussed and encouraged, as a part of hypertension management. The importance of attaining a healthy weight is also discussed.    Obesity, morbid Deteriorated.Refuses to weigh, stats  She has  Gained 23 pounds since 06/2014 Patient re-educated about  the importance of commitment to a  minimum of 150 minutes of exercise per week. The importance of healthy food choices with portion control discussed. Encouraged to start a food diary, count calories and to consider  joining a support group. Sample diet sheets offered. Goals set by the patient for the next several months.      Hyperkalemia Never filled recnt NSAID prescribed for heel pain, advised against this due to interaction with spironolactone, causing hyperklemia Kayelate prescribed, rept potassium level in am, and then in 6 days    Need for Zostavax administration Vaccine administered at visit.

## 2014-11-01 LAB — BASIC METABOLIC PANEL
BUN: 17 mg/dL (ref 6–23)
CALCIUM: 9.5 mg/dL (ref 8.4–10.5)
CO2: 30 mEq/L (ref 19–32)
CREATININE: 0.79 mg/dL (ref 0.50–1.10)
Chloride: 102 mEq/L (ref 96–112)
GLUCOSE: 93 mg/dL (ref 70–99)
Potassium: 4.7 mEq/L (ref 3.5–5.3)
SODIUM: 139 meq/L (ref 135–145)

## 2014-11-03 DIAGNOSIS — Z23 Encounter for immunization: Secondary | ICD-10-CM | POA: Insufficient documentation

## 2014-11-03 NOTE — Assessment & Plan Note (Signed)
Controlled, no change in medication DASH diet and commitment to daily physical activity for a minimum of 30 minutes discussed and encouraged, as a part of hypertension management. The importance of attaining a healthy weight is also discussed.  

## 2014-11-03 NOTE — Assessment & Plan Note (Signed)
Deteriorated.Refuses to weigh, stats  She has  Gained 23 pounds since 06/2014 Patient re-educated about  the importance of commitment to a  minimum of 150 minutes of exercise per week. The importance of healthy food choices with portion control discussed. Encouraged to start a food diary, count calories and to consider  joining a support group. Sample diet sheets offered. Goals set by the patient for the next several months.

## 2014-11-03 NOTE — Assessment & Plan Note (Signed)
Vaccine administered at visit.  

## 2014-11-03 NOTE — Assessment & Plan Note (Addendum)
Never filled recnt NSAID prescribed for heel pain, advised against this due to interaction with spironolactone, causing hyperklemia Kayelate prescribed, rept potassium level in am, and then in 6 days

## 2014-11-07 ENCOUNTER — Telehealth: Payer: Self-pay

## 2014-11-07 LAB — BASIC METABOLIC PANEL
BUN: 9 mg/dL (ref 6–23)
CALCIUM: 9.2 mg/dL (ref 8.4–10.5)
CO2: 28 mEq/L (ref 19–32)
Chloride: 103 mEq/L (ref 96–112)
Creat: 0.84 mg/dL (ref 0.50–1.10)
Glucose, Bld: 96 mg/dL (ref 70–99)
POTASSIUM: 4.4 meq/L (ref 3.5–5.3)
Sodium: 140 mEq/L (ref 135–145)

## 2014-11-07 MED ORDER — OSELTAMIVIR PHOSPHATE 75 MG PO CAPS: 75.0000 mg | ORAL_CAPSULE | Freq: Two times a day (BID) | ORAL | Status: DC

## 2014-11-07 NOTE — Telephone Encounter (Signed)
Acute onset of fever Yes.   Headache Yes.   Body ache Yes.   Fatigue Yes.   Non productive cough Yes.   Sore throat Yes.   Nasal discharge Yes.    Onset between 1-4 days of possible exposure Yes.    Recommendation:  Fluid, rest, Tylenol for control of fever  Expect 5 days before feeling better and not so contagious and generally better in 7-10 days.  Standard treatment Tama flu 75 mg 1 tablet twice daily times 5 days  Please call office if symptoms do not improve or worsen in 5 days after beginning treatment.      

## 2014-11-12 ENCOUNTER — Ambulatory Visit (INDEPENDENT_AMBULATORY_CARE_PROVIDER_SITE_OTHER): Payer: BC Managed Care – PPO | Admitting: Family Medicine

## 2014-11-12 ENCOUNTER — Ambulatory Visit (INDEPENDENT_AMBULATORY_CARE_PROVIDER_SITE_OTHER): Payer: BC Managed Care – PPO

## 2014-11-12 VITALS — BP 134/82 | HR 75 | Temp 98.3°F | Resp 18 | Ht 64.0 in | Wt 314.0 lb

## 2014-11-12 DIAGNOSIS — J029 Acute pharyngitis, unspecified: Secondary | ICD-10-CM

## 2014-11-12 DIAGNOSIS — R6889 Other general symptoms and signs: Secondary | ICD-10-CM | POA: Diagnosis not present

## 2014-11-12 DIAGNOSIS — R059 Cough, unspecified: Secondary | ICD-10-CM

## 2014-11-12 DIAGNOSIS — R05 Cough: Secondary | ICD-10-CM | POA: Diagnosis not present

## 2014-11-12 LAB — POCT RAPID STREP A (OFFICE): Rapid Strep A Screen: NEGATIVE

## 2014-11-12 MED ORDER — HYDROCODONE-HOMATROPINE 5-1.5 MG/5ML PO SYRP: 5.0000 mL | ORAL_SOLUTION | Freq: Every evening | ORAL | Status: DC | PRN

## 2014-11-12 NOTE — Patient Instructions (Signed)

## 2014-11-12 NOTE — Progress Notes (Signed)
 Chief Complaint:  Chief Complaint  Patient presents with  . Sore Throat    x1 week   . Allergic Reaction    discoloration around mouth     HPI: Stefanie Taylor is a 59 y.o. female who is here for worsening cough and sore throat, was having sxs early on about 2 weeks ago, called her PCP DR Moshe Cipro and was treated with tamiflu. She was feeling better on it but her sore throat has worsend, she has some productive cough, white colored, she has no fevers but has had chills. She has tried robitussin to help with cough. No sinus HA except with cough, no ear pain , no SOB or CP. No nausea or vomiting  She has some discoloration around her mouth  Past Medical History  Diagnosis Date  . Hypertension   . Morbid obesity with BMI of 50.0-59.9, adult 08/15/12    BMI: 53.1 kg/m^2   Past Surgical History  Procedure Laterality Date  . Biopsy breast    . Breath tek h pylori  09/04/2012    Procedure: BREATH TEK H PYLORI;  Surgeon: Shann Medal, MD;  Location: [facility];  Service: General;  Laterality: N/A;  . Breath tek h pylori N/A 11/06/2012    Procedure: BREATH TEK H PYLORI;  Surgeon: Shann Medal, MD;  Location: [facility];  Service: General;  Laterality: N/A;  . Umbilical hernia repair      age 27  . Tubal ligation    . Breast surgery  1998    biopsy mole on right breast   History   Social History  . Marital Status: Married    Spouse Name: N/A  . Number of Children: N/A  . Years of Education: N/A   Social History Main Topics  . Smoking status: Never Smoker   . Smokeless tobacco: Never Used  . Alcohol Use: No  . Drug Use: No  . Sexual Activity: Not on file   Other Topics Concern  . None   Social History Narrative   Family History  Problem Relation Age of Onset  . Hypertension Mother   . Heart disease Mother   . Cancer Mother   . Hypertension Father   . Alcohol abuse Sister   . Hypertension Sister   . Diabetes Brother   . Hypertension Brother   .  Diabetes Sister   . Hypertension Sister   . Diabetes Sister   . Hypertension Sister   . Diabetes Brother   . Hypertension Brother   . Diabetes Brother   . Hypertension Brother   . Diabetes Brother   . Hypertension Brother   . Hypertension Brother    Allergies  Allergen Reactions  . Sulfur   . Prednisone   . Lasix [Furosemide] Itching and Swelling  . Ibuprofen Itching and Rash  . Penicillins Rash   Prior to Admission medications   Medication Sig Start Date End Date Taking? Authorizing Provider  acetaminophen (TYLENOL) 500 MG tablet Take 500 mg by mouth every 6 (six) hours as needed.   Yes Historical Provider, MD  Ascorbic Acid (VITAMIN C) 1000 MG tablet Take 2,000 mg by mouth daily.   Yes Historical Provider, MD  Calcium Citrate-Vitamin D (CALCIUM CITRATE + D3 MAXIMUM PO) Take 2 tablets by mouth daily.   Yes Historical Provider, MD  cholecalciferol (VITAMIN D) 1000 UNITS tablet Take 2,000 Units by mouth daily.    Yes Historical Provider, MD  Cinnamon 500 MG capsule Take 500 mg  by mouth daily.   Yes Historical Provider, MD  guaiFENesin (ROBITUSSIN) 100 MG/5ML liquid Take 200 mg by mouth 3 (three) times daily as needed for cough.   Yes Historical Provider, MD  lisinopril (PRINIVIL,ZESTRIL) 20 MG tablet take 1 tablet by mouth once daily 10/17/14  Yes Fayrene Helper, MD  minoxidil (LONITEN) 2.5 MG tablet take 1 tablet by mouth once daily 10/17/14  Yes Fayrene Helper, MD  Multiple Vitamins-Minerals (ALIVE WOMENS 50+ PO) Take 1 tablet by mouth daily.   Yes Historical Provider, MD  oseltamivir (TAMIFLU) 75 MG capsule Take 1 capsule (75 mg total) by mouth 2 (two) times daily. 11/07/14  Yes Fayrene Helper, MD  Prenat w/o A-FE-Methf-FA-Omega (ZATEAN-PN PLUS PO) Take 1 tablet by mouth daily.   Yes Historical Provider, MD  pseudoephedrine (SUDAFED) 30 MG tablet Take 30 mg by mouth every 4 (four) hours as needed for congestion.   Yes Historical Provider, MD  spironolactone (ALDACTONE) 25  MG tablet take 1 tablet by mouth twice a day 09/12/14  Yes Fayrene Helper, MD  Vitamins C E (CRANBERRY CONCENTRATE PO) Take 84 mg by mouth 2 (two) times daily.   Yes Historical Provider, MD  sodium polystyrene (KAYEXALATE) powder 15 gram by mouth 6 hours for two doses total Patient not taking: Reported on 11/12/2014 10/31/14   Fayrene Helper, MD  traMADol Veatrice Bourbon) 50 MG tablet One tablet twice daily , as neded , for knee pain Patient not taking: Reported on 11/12/2014 01/03/14   Fayrene Helper, MD     ROS: The patient denies fevers, chills, night sweats, unintentional weight loss, chest pain, palpitations, wheezing, dyspnea on exertion, nausea, vomiting, abdominal pain, dysuria, hematuria, melena, numbness, weakness, or tingling.  All other systems have been reviewed and were otherwise negative with the exception of those mentioned in the HPI and as above.    PHYSICAL EXAM: Filed Vitals:   11/12/14 0850  BP: 134/82  Pulse: 75  Temp: 98.3 F (36.8 C)  Resp: 18   Filed Vitals:   11/12/14 0850  Height: 5\' 4"  (1.626 m)  Weight: 314 lb (142.429 kg)   Body mass index is 53.87 kg/(m^2).  General: Alert, no acute distress, obese HEENT:  Normocephalic, atraumatic, oropharynx patent. EOMI, PERRLA Erythematous throat, no exudates, TM normal, +/- sinus tenderness, + erythematous/boggy nasal mucosa, no thrush Cardiovascular:  Regular rate and rhythm, no rubs murmurs or gallops.  No Carotid bruits, radial pulse intact. No pedal edema.  Respiratory: Clear to auscultation bilaterally.  No wheezes, rales, or rhonchi.  No cyanosis, no use of accessory musculature GI: No organomegaly, abdomen is soft and non-tender, positive bowel sounds.  No masses. Skin: No rashes. + minimal chelitis but I think this is illness related Neurologic: Facial musculature symmetric. Psychiatric: Patient is appropriate throughout our interaction. Lymphatic: No cervical lymphadenopathy Musculoskeletal: Gait  intact.   LABS: Results for orders placed or performed in visit on 11/12/14  Culture, Group A Strep  Result Value Ref Range   Organism ID, Bacteria Normal Upper Respiratory Flora    Organism ID, Bacteria No Beta Hemolytic Streptococci Isolated   POCT rapid strep A  Result Value Ref Range   Rapid Strep A Screen Negative Negative     EKG/XRAY:   Primary read interpreted by Dr. Marin Comment at [facility]. Neg for any acute cardiopulm process   ASSESSMENT/PLAN: Encounter Diagnoses  Name Primary?  . Cough Yes  . Sore throat   . Flu-like symptoms    Voice rest  and fluids Cepachol lozenges Rx hycodan Already on BP meds, monitor BP Throat cx pending   Gross sideeffects, risk and benefits, and alternatives of medications d/w patient. Patient is aware that all medications have potential sideeffects and we are unable to predict every sideeffect or drug-drug interaction that may occur.  , [place], DO 11/14/2014 10:27 AM   Rapid strep and cx negative , patient was informed on 11/13/14

## 2014-11-13 ENCOUNTER — Ambulatory Visit (INDEPENDENT_AMBULATORY_CARE_PROVIDER_SITE_OTHER): Payer: BC Managed Care – PPO | Admitting: Podiatry

## 2014-11-13 VITALS — BP 178/85 | HR 74 | Resp 16

## 2014-11-13 DIAGNOSIS — M722 Plantar fascial fibromatosis: Secondary | ICD-10-CM | POA: Diagnosis not present

## 2014-11-13 LAB — CULTURE, GROUP A STREP: Organism ID, Bacteria: NORMAL

## 2014-11-14 ENCOUNTER — Encounter: Payer: Self-pay | Admitting: *Deleted

## 2014-11-14 NOTE — Progress Notes (Signed)
Subjective:     Patient ID: Stefanie Taylor, female   DOB: 1955/11/07, 59 y.o.   MRN: [id number]  HPI patient presents stating I'm doing okay with my heel with mild discomfort and I need my old orthotics recovered   Review of Systems     Objective:   Physical Exam Neurovascular status intact with muscle strength adequate range of motion within normal limits and noted to have outer discomfort to palpation plantar fascia right    Assessment:     Improving plantar fasciitis right    Plan:     Advised on physical therapy and at this time I dispensed new orthotics with instructions and we reviewed how they are doing. We are sending her old pair for recovering

## 2014-11-19 DIAGNOSIS — M722 Plantar fascial fibromatosis: Secondary | ICD-10-CM

## 2014-12-25 ENCOUNTER — Encounter: Payer: Self-pay | Admitting: Podiatry

## 2014-12-25 ENCOUNTER — Ambulatory Visit (INDEPENDENT_AMBULATORY_CARE_PROVIDER_SITE_OTHER): Payer: BC Managed Care – PPO | Admitting: Podiatry

## 2014-12-25 VITALS — BP 143/78 | HR 80 | Resp 15

## 2014-12-25 DIAGNOSIS — M7661 Achilles tendinitis, right leg: Secondary | ICD-10-CM | POA: Diagnosis not present

## 2014-12-25 DIAGNOSIS — M722 Plantar fascial fibromatosis: Secondary | ICD-10-CM | POA: Diagnosis not present

## 2014-12-25 MED ORDER — TRIAMCINOLONE ACETONIDE 10 MG/ML IJ SUSP
10.0000 mg | Freq: Once | INTRAMUSCULAR | Status: AC
  Administered 2014-12-25: 10 mg

## 2014-12-25 NOTE — Progress Notes (Signed)
Subjective:     Patient ID: Stefanie Taylor, female   DOB: 09/17/1955, 59 y.o.   MRN: [id number]  HPI patient states my heel is really hurting on the right more in the back in the bottom and I'm not sure if the orthotics fit well in the back   Review of Systems     Objective:   Physical Exam Neurovascular status intact with obesity is complicating factor and discomfort posterior aspect heel right lateral side with mild pain also on the medial side area plantar heel is doing pretty well the current time    Assessment:     Achilles tendinitis right lateral side mild on the medial side with obesity as complicating factor and plantar fasciitis, complicating factor    Plan:     Reviewed both conditions and today careful injection of the lateral side right Achilles 3 mg dexamethasone Kenalog 5 mg Xylocaine first explaining to her the chances for rupture associated with it and she wants the procedure and we performed. Discussed possibility for shockwave if symptoms persist

## 2015-01-08 ENCOUNTER — Encounter: Payer: Self-pay | Admitting: Podiatry

## 2015-01-08 ENCOUNTER — Ambulatory Visit (INDEPENDENT_AMBULATORY_CARE_PROVIDER_SITE_OTHER): Payer: BC Managed Care – PPO | Admitting: Podiatry

## 2015-01-08 VITALS — BP 129/65 | HR 78 | Resp 14

## 2015-01-08 DIAGNOSIS — M7661 Achilles tendinitis, right leg: Secondary | ICD-10-CM

## 2015-01-08 MED ORDER — TRIAMCINOLONE ACETONIDE 10 MG/ML IJ SUSP
10.0000 mg | Freq: Once | INTRAMUSCULAR | Status: AC
  Administered 2015-01-08: 10 mg

## 2015-01-09 NOTE — Progress Notes (Signed)
Subjective:     Patient ID: Stefanie Taylor, female   DOB: 11/09/1955, 59 y.o.   MRN: [id number]  HPI patient states that her right heel is doing much better but her left posterior heel continues to give her persistent pain on the lateral side   Review of Systems     Objective:   Physical Exam Neurovascular status intact with significant diminishment of discomfort in the posterior right heel lateral side with moderate discomfort on the posterior lateral side left heel when palpated    Assessment:     Achilles tendinitis improved right and present left    Plan:     Reviewed condition and recommended careful injection of the lateral side left heel explaining chances for rupture. She wants procedure and today I injected with half cc of dexamethasone Kenalog half cc of Xylocaine into the lateral side and instructed on reduced activity. Reappoint as symptoms indicate

## 2015-01-29 ENCOUNTER — Telehealth: Payer: Self-pay | Admitting: Family Medicine

## 2015-01-29 DIAGNOSIS — E875 Hyperkalemia: Secondary | ICD-10-CM

## 2015-01-29 DIAGNOSIS — I10 Essential (primary) hypertension: Secondary | ICD-10-CM

## 2015-01-29 NOTE — Telephone Encounter (Signed)
Patient will go tomorrow and lab order is faxed

## 2015-01-29 NOTE — Telephone Encounter (Signed)
Please call pt and ask that she get non fasdt chem 7 so I can be sure that her potassium remains normal  Order non fast chem 7 ho[pefully she can get it by Friday, NOT STAT

## 2015-01-29 NOTE — Addendum Note (Signed)
Addended by: Eual Fines on: 01/29/2015 02:08 PM   Modules accepted: Orders

## 2015-01-30 LAB — BASIC METABOLIC PANEL
BUN: 14 mg/dL (ref 6–23)
CO2: 28 mEq/L (ref 19–32)
Calcium: 9.8 mg/dL (ref 8.4–10.5)
Chloride: 103 mEq/L (ref 96–112)
Creat: 0.8 mg/dL (ref 0.50–1.10)
Glucose, Bld: 95 mg/dL (ref 70–99)
Potassium: 6 mEq/L — ABNORMAL HIGH (ref 3.5–5.3)
SODIUM: 142 meq/L (ref 135–145)

## 2015-01-31 ENCOUNTER — Other Ambulatory Visit: Payer: Self-pay

## 2015-01-31 ENCOUNTER — Other Ambulatory Visit: Payer: Self-pay | Admitting: Family Medicine

## 2015-01-31 MED ORDER — AMLODIPINE BESYLATE 2.5 MG PO TABS: 2.5000 mg | ORAL_TABLET | Freq: Every day | ORAL | Status: DC

## 2015-01-31 MED ORDER — SODIUM POLYSTYRENE SULFONATE PO POWD: ORAL | Status: DC

## 2015-01-31 NOTE — Addendum Note (Signed)
Addended by: Eual Fines on: 01/31/2015 08:55 AM   Modules accepted: Orders, Medications

## 2015-02-13 ENCOUNTER — Other Ambulatory Visit: Payer: Self-pay | Admitting: Family Medicine

## 2015-02-13 ENCOUNTER — Ambulatory Visit: Payer: BC Managed Care – PPO

## 2015-02-13 ENCOUNTER — Other Ambulatory Visit: Payer: Self-pay

## 2015-02-13 ENCOUNTER — Telehealth: Payer: Self-pay | Admitting: Family Medicine

## 2015-02-13 VITALS — BP 130/82

## 2015-02-13 DIAGNOSIS — I10 Essential (primary) hypertension: Secondary | ICD-10-CM

## 2015-02-13 DIAGNOSIS — E875 Hyperkalemia: Secondary | ICD-10-CM

## 2015-02-13 DIAGNOSIS — R7302 Impaired glucose tolerance (oral): Secondary | ICD-10-CM

## 2015-02-13 LAB — BASIC METABOLIC PANEL
BUN: 15 mg/dL (ref 6–23)
CALCIUM: 8.9 mg/dL (ref 8.4–10.5)
CO2: 29 meq/L (ref 19–32)
CREATININE: 0.77 mg/dL (ref 0.50–1.10)
Chloride: 105 mEq/L (ref 96–112)
GLUCOSE: 103 mg/dL — AB (ref 70–99)
POTASSIUM: 4.1 meq/L (ref 3.5–5.3)
SODIUM: 139 meq/L (ref 135–145)

## 2015-02-13 MED ORDER — SPIRONOLACTONE 25 MG PO TABS: ORAL_TABLET | ORAL | Status: DC

## 2015-02-13 NOTE — Progress Notes (Signed)
Patient in for blood pressure check.  Blood pressure checked manually.  Patient had labs done this am to check potassium level.   She is currently taking Amlodipine, Spironolactone, and Minoxidil for blood pressure.   Is complaining of increased swelling in ankles and feet.  Would like to know if there are any further medication adjustments that can help with this.   Advised patient that we will have to 1st see the results of her labs from today before that decision can be made.    Patient understanding and will await further instructions.

## 2015-02-13 NOTE — Telephone Encounter (Signed)
Pls call pt , since with controlled  BP she is concerned that her legs are still too swollen with very low dose of amlodipine, I recommend STOP amlodipine and increase the spironolactone to 25 mg three daily, take 2 in am and one in pm  Needs rept chem 7 stat and hBA1C not  stat on morning of Feb 10, also nurse bP check  ?? Please ask

## 2015-02-13 NOTE — Telephone Encounter (Signed)
Spoke with patient and she understands that she should discontinue Amlodipine with the new increase in Spironolactone.  She will do this and come in for nurse visit on 6/10 after she has her labs drawn.  Labs ordered and faxed to [facility].

## 2015-02-13 NOTE — Addendum Note (Signed)
Addended by: Denman George B on: 02/13/2015 05:03 PM   Modules accepted: Orders

## 2015-02-17 ENCOUNTER — Other Ambulatory Visit: Payer: Self-pay

## 2015-02-17 DIAGNOSIS — Z1231 Encounter for screening mammogram for malignant neoplasm of breast: Secondary | ICD-10-CM

## 2015-02-19 LAB — HM PAP SMEAR

## 2015-02-21 ENCOUNTER — Telehealth: Payer: Self-pay

## 2015-02-21 ENCOUNTER — Other Ambulatory Visit: Payer: Self-pay

## 2015-02-21 ENCOUNTER — Other Ambulatory Visit: Payer: Self-pay | Admitting: Family Medicine

## 2015-02-21 DIAGNOSIS — E875 Hyperkalemia: Secondary | ICD-10-CM

## 2015-02-21 LAB — BASIC METABOLIC PANEL
BUN: 16 mg/dL (ref 6–23)
CHLORIDE: 106 meq/L (ref 96–112)
CO2: 25 meq/L (ref 19–32)
Calcium: 9.7 mg/dL (ref 8.4–10.5)
Creat: 0.85 mg/dL (ref 0.50–1.10)
Glucose, Bld: 98 mg/dL (ref 70–99)
Potassium: 6 mEq/L — ABNORMAL HIGH (ref 3.5–5.3)
Sodium: 140 mEq/L (ref 135–145)

## 2015-02-21 LAB — HEMOGLOBIN A1C
Hgb A1c MFr Bld: 5 % (ref <5.7)
Mean Plasma Glucose: 97 mg/dL (ref <117)

## 2015-02-21 MED ORDER — CLONIDINE HCL 0.2 MG PO TABS
ORAL_TABLET | ORAL | Status: DC
Start: 2015-02-21 — End: 2015-09-04

## 2015-02-21 MED ORDER — SODIUM POLYSTYRENE SULFONATE PO POWD: Freq: Once | ORAL | Status: DC

## 2015-02-21 NOTE — Telephone Encounter (Signed)
Not aware that this will  affect her potassium adversely, pls review the list of potassium rich foods with her / have it to give to her when she comes. Encourage her to see if potassium content per serving is documented on the bottle, she may bring a label next week for review if she wishes Generally remind her that MODERATION in all things is the best way to go!

## 2015-02-23 ENCOUNTER — Other Ambulatory Visit: Payer: Self-pay | Admitting: Family Medicine

## 2015-02-24 ENCOUNTER — Other Ambulatory Visit: Payer: Self-pay

## 2015-02-24 LAB — BASIC METABOLIC PANEL
BUN: 20 mg/dL (ref 6–23)
CO2: 25 meq/L (ref 19–32)
CREATININE: 0.92 mg/dL (ref 0.50–1.10)
Calcium: 9.3 mg/dL (ref 8.4–10.5)
Chloride: 106 mEq/L (ref 96–112)
Glucose, Bld: 90 mg/dL (ref 70–99)
Potassium: 4.4 mEq/L (ref 3.5–5.3)
SODIUM: 138 meq/L (ref 135–145)

## 2015-02-24 MED ORDER — MINOXIDIL 2.5 MG PO TABS: 2.5000 mg | ORAL_TABLET | Freq: Every day | ORAL | Status: DC

## 2015-02-24 NOTE — Telephone Encounter (Signed)
Patient aware.  Will check the contents of the vinegar.

## 2015-03-13 ENCOUNTER — Encounter: Payer: Self-pay | Admitting: Family Medicine

## 2015-03-13 ENCOUNTER — Ambulatory Visit (INDEPENDENT_AMBULATORY_CARE_PROVIDER_SITE_OTHER): Payer: BC Managed Care – PPO | Admitting: Family Medicine

## 2015-03-13 VITALS — BP 128/82 | HR 77 | Resp 14 | Ht 60.0 in

## 2015-03-13 DIAGNOSIS — M150 Primary generalized (osteo)arthritis: Secondary | ICD-10-CM

## 2015-03-13 DIAGNOSIS — M1990 Unspecified osteoarthritis, unspecified site: Secondary | ICD-10-CM | POA: Insufficient documentation

## 2015-03-13 DIAGNOSIS — J3089 Other allergic rhinitis: Secondary | ICD-10-CM

## 2015-03-13 DIAGNOSIS — R600 Localized edema: Secondary | ICD-10-CM | POA: Diagnosis not present

## 2015-03-13 DIAGNOSIS — I10 Essential (primary) hypertension: Secondary | ICD-10-CM | POA: Diagnosis not present

## 2015-03-13 DIAGNOSIS — Z1211 Encounter for screening for malignant neoplasm of colon: Secondary | ICD-10-CM

## 2015-03-13 DIAGNOSIS — L500 Allergic urticaria: Secondary | ICD-10-CM

## 2015-03-13 DIAGNOSIS — M159 Polyosteoarthritis, unspecified: Secondary | ICD-10-CM

## 2015-03-13 NOTE — Assessment & Plan Note (Signed)
Controlled, no change in medication DASH diet and commitment to daily physical activity for a minimum of 30 minutes discussed and encouraged, as a part of hypertension management. The importance of attaining a healthy weight is also discussed.  BP/Weight 02/13/2015 01/08/2015 12/25/2014 11/13/2014 11/12/2014 10/31/2014 1/61/0960  Systolic BP 454 098 119 147 829 562 130  Diastolic BP 82 65 78 85 82 74 63  Wt. (Lbs) - - - - 314 - -  BMI - - - - 53.87 - -

## 2015-03-13 NOTE — Patient Instructions (Addendum)
F/u in 4 months, pleas call if you need me before  No changes in current medications  You are referred for your colonoscopy, please call in and let us know the Gastroenterologist who you wish to see  Lab work from February of this year shows normal blood count, normal kidney and thyroid function and normal blood sugar. Excellent cholesterol also  Prescription  is sent for bilateral knee  high compression hose  Non fast chem 7 in 4 month  Thanks for choosing [facility], we consider it a privelige to serve you. Please work on good  health habits so that your health will improve. 1. Commitment to daily physical activity for 30 to 60  minutes, if you are able to do this.  2. Commitment to wise food choices. Aim for half of your  food intake to be vegetable and fruit, one quarter starchy foods, and one quarter protein. Try to eat on a regular schedule  3 meals per day, snacking between meals should be limited to vegetables or fruits or small portions of nuts. 64 ounces of water per day is generally recommended, unless you have specific health conditions, like heart failure or kidney failure where you will need to limit fluid intake.  3. Commitment to sufficient and a  good quality of physical and mental rest daily, generally between 6 to 8 hours per day.  WITH PERSISTANCE AND PERSEVERANCE, THE IMPOSSIBLE , BECOMES THE NORM!

## 2015-03-13 NOTE — Assessment & Plan Note (Signed)
Chronic complaint, will benefit from knee high compression hose which she has used in the past and now requests, will send in script to [facility]

## 2015-03-13 NOTE — Assessment & Plan Note (Signed)
Unchnaged. Patient re-educated about  the importance of commitment to a  minimum of 150 minutes of exercise per week.  The importance of healthy food choices with portion control discussed. Encouraged to start a food diary, count calories and to consider  joining a support group. Sample diet sheets offered. Goals set by the patient for the next several months.   Weight /BMI 11/12/2014 10/31/2014 04/29/2014  WEIGHT 314 lb - -  HEIGHT 5\' 4"  5\' 5"  5\' 5"   BMI 53.87 kg/m2 - -    Current exercise per week 40 minutes.

## 2015-03-13 NOTE — Assessment & Plan Note (Signed)
Currently asymptomatic off of medication

## 2015-03-13 NOTE — Assessment & Plan Note (Signed)
No flare since last visit

## 2015-03-13 NOTE — Progress Notes (Signed)
Stefanie Taylor     MRN: [id number]      DOB: 08-17-1956   HPI Stefanie Taylor is here for follow up and re-evaluation of chronic medical conditions, medication management and review of any available recent lab and radiology data.  Preventive health is updated, specifically  Cancer screening and Immunization.   Questions or concerns regarding consultations or procedures which the PT has had in the interim are  addressed. The PT denies any adverse reactions to current medications since the last visit.  Wants help for joint pain, esp in ankles which at times awakens her , and c/o leg swelling wants hose ROS Denies recent fever or chills. Denies sinus pressure, nasal congestion, ear pain or sore throat. Denies chest congestion, productive cough or wheezing. Denies chest pains, palpitations, PND or orthopnea, c/o  leg swelling Denies abdominal pain, nausea, vomiting,diarrhea or constipation.   Denies dysuria, frequency, hesitancy or incontinence. C/o pain and swelling in ankles and knees, awakens at night with pain especially in right ankle. Denies headaches, seizures, numbness, or tingling. Denies depression, anxiety or insomnia. Denies skin break down or rash.   PE  BP 128/82 mmHg  Pulse 77  Resp 14  Ht 5' (1.524 m)  Wt   SpO2 99%  Patient alert and oriented and in no cardiopulmonary distress.  HEENT: No facial asymmetry, EOMI,   oropharynx pink and moist.  Neck supple no JVD, no mass.  Chest: Clear to auscultation bilaterally.  CVS: S1, S2 no murmurs, no S3.Regular rate.  ABD: Soft non tender.   Ext: one plus leg edema, bilateral  MS: Adequate ROM spine, shoulders, hips and knees.Bilateral ankle deformity  Skin: Intact, no ulcerations or rash noted.  Psych: Good eye contact, normal affect. Memory intact not anxious or depressed appearing.  CNS: CN 2-12 intact, power,  normal throughout.no focal deficits noted.   Assessment & Plan   HTN  (hypertension) Controlled, no change in medication DASH diet and commitment to daily physical activity for a minimum of 30 minutes discussed and encouraged, as a part of hypertension management. The importance of attaining a healthy weight is also discussed.  BP/Weight 02/13/2015 01/08/2015 12/25/2014 11/13/2014 11/12/2014 10/31/2014 2/87/8676  Systolic BP 720 947 096 283 662 947 654  Diastolic BP 82 65 78 85 82 74 63  Wt. (Lbs) - - - - 314 - -  BMI - - - - 53.87 - -        Osteoarthritis C/o bilateral knee and ankle pain, right ankle pain disturbs her sleep, advised tylenol 500 mg one twice daily Weigth loss , and daily physical activity   Morbid obesity, Weight - 324, BMI - 53.9. Unchnaged. Patient re-educated about  the importance of commitment to a  minimum of 150 minutes of exercise per week.  The importance of healthy food choices with portion control discussed. Encouraged to start a food diary, count calories and to consider  joining a support group. Sample diet sheets offered. Goals set by the patient for the next several months.   Weight /BMI 11/12/2014 10/31/2014 04/29/2014  WEIGHT 314 lb - -  HEIGHT 5\' 4"  5\' 5"  5\' 5"   BMI 53.87 kg/m2 - -    Current exercise per week 40 minutes.   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

## 2015-03-13 NOTE — Assessment & Plan Note (Signed)
C/o bilateral knee and ankle pain, right ankle pain disturbs her sleep, advised tylenol 500 mg one twice daily Weigth loss , and daily physical activity

## 2015-03-14 ENCOUNTER — Encounter: Payer: Self-pay | Admitting: Family Medicine

## 2015-03-14 NOTE — Addendum Note (Signed)
Addended by: Denman George B on: 03/14/2015 10:15 AM   Modules accepted: Orders

## 2015-03-28 ENCOUNTER — Other Ambulatory Visit: Payer: Self-pay

## 2015-03-28 ENCOUNTER — Telehealth: Payer: Self-pay

## 2015-03-28 ENCOUNTER — Ambulatory Visit
Admission: RE | Admit: 2015-03-28 | Discharge: 2015-03-28 | Disposition: A | Discharge status: Home / Self Care | Payer: BC Managed Care – PPO | Source: Ambulatory Visit

## 2015-03-28 DIAGNOSIS — Z1231 Encounter for screening mammogram for malignant neoplasm of breast: Secondary | ICD-10-CM

## 2015-03-28 MED ORDER — DICLOFENAC SODIUM 1 % TD GEL: 2.0000 g | Freq: Four times a day (QID) | TRANSDERMAL | Status: DC

## 2015-03-28 NOTE — Telephone Encounter (Signed)
I have entered historically, HOWEVER she has diclofenac allergy listed as itching, pls verify with her that she has used the gel with no problem BEFORE sending  ALSO , pls let her know that I am glad to see that she had her appt for colonoscopy scheduled and I trust she did see Dr Benson Norway (7/13)

## 2015-03-28 NOTE — Telephone Encounter (Signed)
Verified with patient and she states that she had problems with the med orally and has used gel with no problems.

## 2015-03-31 ENCOUNTER — Telehealth: Payer: Self-pay

## 2015-03-31 NOTE — Telephone Encounter (Signed)
Explain situation to pt , and try to locate prescribed hose from another vendor, also ask if she had prescription hose,before  So I can f/u that dose etc

## 2015-03-31 NOTE — Telephone Encounter (Signed)
Sharee Pimple from apothecary states that they don't have any hose to fit patient in the 15-20 compression range. They only way they will have some to fit her is to go up to the 20-30 compression. Is this something you agree with?

## 2015-04-01 NOTE — Telephone Encounter (Signed)
rx faxed to [facility] per patient to see if they can fit her for compression hose

## 2015-04-01 NOTE — Telephone Encounter (Signed)
Called patient and left message for them to return call at the office   

## 2015-06-24 ENCOUNTER — Other Ambulatory Visit: Payer: Self-pay | Admitting: Family Medicine

## 2015-07-29 ENCOUNTER — Ambulatory Visit: Payer: BC Managed Care – PPO | Admitting: Family Medicine

## 2015-08-14 ENCOUNTER — Telehealth: Payer: Self-pay | Admitting: Family Medicine

## 2015-08-14 NOTE — Telephone Encounter (Signed)
Opened in Error.

## 2015-09-02 ENCOUNTER — Other Ambulatory Visit: Payer: Self-pay | Admitting: Family Medicine

## 2015-09-02 ENCOUNTER — Telehealth: Payer: Self-pay | Admitting: Family Medicine

## 2015-09-02 DIAGNOSIS — R7302 Impaired glucose tolerance (oral): Secondary | ICD-10-CM

## 2015-09-02 DIAGNOSIS — I10 Essential (primary) hypertension: Secondary | ICD-10-CM

## 2015-09-02 NOTE — Telephone Encounter (Signed)
Patient is asking for lab orders to be sent over to [facility] by 09/03/15

## 2015-09-02 NOTE — Telephone Encounter (Signed)
Labs ordered and faxed to lab

## 2015-09-03 LAB — HEMOGLOBIN A1C
Hgb A1c MFr Bld: 5.1 % (ref <5.7)
Mean Plasma Glucose: 100 mg/dL (ref <117)

## 2015-09-03 LAB — LIPID PANEL
CHOL/HDL RATIO: 2.7 ratio (ref <=5.0)
Cholesterol: 163 mg/dL (ref 125–200)
HDL: 60 mg/dL (ref >=46)
LDL CALC: 93 mg/dL (ref <130)
TRIGLYCERIDES: 52 mg/dL (ref <150)
VLDL: 10 mg/dL (ref <30)

## 2015-09-03 LAB — COMPREHENSIVE METABOLIC PANEL
ALBUMIN: 4 g/dL (ref 3.6–5.1)
ALT: 16 U/L (ref 6–29)
AST: 20 U/L (ref 10–35)
Alkaline Phosphatase: 71 U/L (ref 33–130)
BUN: 16 mg/dL (ref 7–25)
CALCIUM: 9.2 mg/dL (ref 8.6–10.4)
CHLORIDE: 102 mmol/L (ref 98–110)
CO2: 28 mmol/L (ref 20–31)
CREATININE: 0.72 mg/dL (ref 0.50–0.99)
Glucose, Bld: 90 mg/dL (ref 65–99)
POTASSIUM: 4.3 mmol/L (ref 3.5–5.3)
SODIUM: 138 mmol/L (ref 135–146)
Total Bilirubin: 0.6 mg/dL (ref 0.2–1.2)
Total Protein: 7.3 g/dL (ref 6.1–8.1)

## 2015-09-04 ENCOUNTER — Ambulatory Visit (INDEPENDENT_AMBULATORY_CARE_PROVIDER_SITE_OTHER): Payer: BC Managed Care – PPO | Admitting: Family Medicine

## 2015-09-04 ENCOUNTER — Encounter: Payer: Self-pay | Admitting: Family Medicine

## 2015-09-04 VITALS — BP 128/82 | HR 70 | Resp 18 | Ht 64.0 in | Wt 320.0 lb

## 2015-09-04 DIAGNOSIS — E559 Vitamin D deficiency, unspecified: Secondary | ICD-10-CM | POA: Diagnosis not present

## 2015-09-04 DIAGNOSIS — Z23 Encounter for immunization: Secondary | ICD-10-CM | POA: Diagnosis not present

## 2015-09-04 DIAGNOSIS — I10 Essential (primary) hypertension: Secondary | ICD-10-CM

## 2015-09-04 MED ORDER — SPIRONOLACTONE 25 MG PO TABS: 25.0000 mg | ORAL_TABLET | Freq: Two times a day (BID) | ORAL | Status: DC

## 2015-09-04 NOTE — Assessment & Plan Note (Signed)
Deteriorated. Patient re-educated about  the importance of commitment to a  minimum of 150 minutes of exercise per week.  The importance of healthy food choices with portion control discussed. Encouraged to start a food diary, count calories and to consider  joining a support group. Sample diet sheets offered. Goals set by the patient for the next several months.   Weight /BMI 09/04/2015 03/13/2015 11/12/2014  WEIGHT 320 lb - 314 lb  HEIGHT 5\' 4"  5\' 0"  5\' 4"   BMI 54.9 kg/m2 - 53.87 kg/m2    Current exercise per week 60 minutes.

## 2015-09-04 NOTE — Assessment & Plan Note (Signed)
Controlled, no change in medication DASH diet and commitment to daily physical activity for a minimum of 30 minutes discussed and encouraged, as a part of hypertension management. The importance of attaining a healthy weight is also discussed.  BP/Weight 09/04/2015 03/13/2015 02/13/2015 01/08/2015 12/25/2014 [id number]  Systolic BP 0000000 0000000 [id number] [id number] 0000000 Q000111Q  Diastolic BP 82 82 82 65 78 85 82  Wt. (Lbs) 320 - - - - - 314  BMI 54.9 - - - - - 53.87

## 2015-09-04 NOTE — Progress Notes (Signed)
   Subjective:    Patient ID: Stefanie Taylor, female    DOB: 1955/12/20, 59 y.o.   MRN: [id number]  HPI   Stefanie Taylor     MRN: [id number]      DOB: June 05, 1956   HPI Stefanie Taylor is here for follow up and re-evaluation of chronic medical conditions, medication management and review of any available recent lab and radiology data.  Preventive health is updated, specifically  Cancer screening and Immunization.   Questions or concerns regarding consultations or procedures which the PT has had in the interim are  addressed. The PT denies any adverse reactions to current medications since the last visit.  There are no new concerns.  There are no specific complaints   ROS Denies recent fever or chills. Denies sinus pressure, nasal congestion, ear pain or sore throat. Denies chest congestion, productive cough or wheezing. Denies chest pains, palpitations and leg swelling Denies abdominal pain, nausea, vomiting,diarrhea or constipation.   Denies dysuria, frequency, hesitancy or incontinence. Denies joint pain, swelling and limitation in mobility. Denies headaches, seizures, numbness, or tingling. Denies depression, anxiety or insomnia. Denies skin break down or rash.   PE  BP 128/82 mmHg  Pulse 70  Resp 18  Ht 5\' 4"  (1.626 m)  Wt 320 lb (145.151 kg)  BMI 54.90 kg/m2  SpO2 97%  Patient alert and oriented and in no cardiopulmonary distress.  HEENT: No facial asymmetry, EOMI,   oropharynx pink and moist.  Neck supple no JVD, no mass.  Chest: Clear to auscultation bilaterally.  CVS: S1, S2 no murmurs, no S3.Regular rate.  ABD: Soft non tender.   Ext: No edema  MS: Adequate ROM spine, shoulders, hips and knees.  Skin: Intact, no ulcerations or rash noted.  Psych: Good eye contact, normal affect. Memory intact not anxious or depressed appearing.  CNS: CN 2-12 intact, power,  normal throughout.no focal deficits noted.   Assessment & Plan   HTN  (hypertension) Controlled, no change in medication DASH diet and commitment to daily physical activity for a minimum of 30 minutes discussed and encouraged, as a part of hypertension management. The importance of attaining a healthy weight is also discussed.  BP/Weight 09/04/2015 03/13/2015 02/13/2015 01/08/2015 12/25/2014 [id number]  Systolic BP 0000000 0000000 [id number] Q000111Q [id number] 0000000 Q000111Q  Diastolic BP 82 82 82 65 78 85 82  Wt. (Lbs) 320 - - - - - 314  BMI 54.9 - - - - - 53.87        Morbid obesity, Weight - 324, BMI - 53.9. Deteriorated. Patient re-educated about  the importance of commitment to a  minimum of 150 minutes of exercise per week.  The importance of healthy food choices with portion control discussed. Encouraged to start a food diary, count calories and to consider  joining a support group. Sample diet sheets offered. Goals set by the patient for the next several months.   Weight /BMI 09/04/2015 03/13/2015 11/12/2014  WEIGHT 320 lb - 314 lb  HEIGHT 5\' 4"  5\' 0"  5\' 4"   BMI 54.9 kg/m2 - 53.87 kg/m2    Current exercise per week 60 minutes.       Review of Systems     Objective:   Physical Exam        Assessment & Plan:

## 2015-09-04 NOTE — Patient Instructions (Addendum)
F/u in 6 months, call if you need me before  Fasting labs in 6 month  Flu vaccine today   Spironolactone one tablet twice daily for blood pressure, no more clonidine  All the best for 2017!  Thanks for choosing [facility], we consider it a privelige to serve you.   Please work on good  health habits so that your health will improve. 1. Commitment to daily physical activity for 30 to 60  minutes, if you are able to do this.  2. Commitment to wise food choices. Aim for half of your  food intake to be vegetable and fruit, one quarter starchy foods, and one quarter protein. Try to eat on a regular schedule  3 meals per day, snacking between meals should be limited to vegetables or fruits or small portions of nuts. 64 ounces of water per day is generally recommended, unless you have specific health conditions, like heart failure or kidney failure where you will need to limit fluid intake.  3. Commitment to sufficient and a  good quality of physical and mental rest daily, generally between 6 to 8 hours per day.  WITH PERSISTANCE AND PERSEVERANCE, THE IMPOSSIBLE , BECOMES THE NORM!

## 2015-09-05 LAB — HIV ANTIBODY (ROUTINE TESTING W REFLEX): HIV 1&2 Ab, 4th Generation: NONREACTIVE

## 2015-09-05 LAB — HEPATITIS C ANTIBODY: HCV AB: NEGATIVE

## 2015-10-28 ENCOUNTER — Other Ambulatory Visit: Payer: Self-pay

## 2015-10-28 MED ORDER — MINOXIDIL 2.5 MG PO TABS: 2.5000 mg | ORAL_TABLET | Freq: Every day | ORAL | Status: DC

## 2016-02-20 ENCOUNTER — Other Ambulatory Visit: Payer: Self-pay

## 2016-02-20 MED ORDER — MINOXIDIL 2.5 MG PO TABS: 2.5000 mg | ORAL_TABLET | Freq: Every day | ORAL | Status: DC

## 2016-03-29 ENCOUNTER — Telehealth: Payer: Self-pay

## 2016-03-29 NOTE — Telephone Encounter (Signed)
njeeds to be eval in office , tomorrow am early??? Or this pm???

## 2016-03-29 NOTE — Telephone Encounter (Signed)
X 3 days, has been having pain in her chest when taking a deep breath and the right side of her ribs hurt when she moves a certain way. Small amount of chest mucus being produced but no fever chills or bodyaches. Afraid she may have pneumonia. Offered appt but she is tied up with work.Please advise

## 2016-03-29 NOTE — Telephone Encounter (Signed)
Coming in the am

## 2016-03-30 ENCOUNTER — Ambulatory Visit (INDEPENDENT_AMBULATORY_CARE_PROVIDER_SITE_OTHER): Payer: BC Managed Care – PPO | Admitting: Family Medicine

## 2016-03-30 ENCOUNTER — Ambulatory Visit (HOSPITAL_COMMUNITY)
Admission: RE | Admit: 2016-03-30 | Discharge: 2016-03-30 | Disposition: A | Discharge status: Home / Self Care | Payer: BC Managed Care – PPO | Source: Ambulatory Visit | Attending: Family Medicine | Admitting: Family Medicine

## 2016-03-30 ENCOUNTER — Other Ambulatory Visit: Payer: Self-pay

## 2016-03-30 ENCOUNTER — Encounter: Payer: Self-pay | Admitting: Family Medicine

## 2016-03-30 VITALS — BP 118/78 | HR 70 | Resp 18 | Ht 64.0 in

## 2016-03-30 DIAGNOSIS — I10 Essential (primary) hypertension: Secondary | ICD-10-CM

## 2016-03-30 DIAGNOSIS — R918 Other nonspecific abnormal finding of lung field: Secondary | ICD-10-CM | POA: Diagnosis not present

## 2016-03-30 DIAGNOSIS — I517 Cardiomegaly: Secondary | ICD-10-CM | POA: Diagnosis not present

## 2016-03-30 DIAGNOSIS — R0789 Other chest pain: Secondary | ICD-10-CM

## 2016-03-30 DIAGNOSIS — R079 Chest pain, unspecified: Secondary | ICD-10-CM | POA: Insufficient documentation

## 2016-03-30 DIAGNOSIS — R072 Precordial pain: Secondary | ICD-10-CM | POA: Diagnosis not present

## 2016-03-30 DIAGNOSIS — Z1321 Encounter for screening for nutritional disorder: Secondary | ICD-10-CM

## 2016-03-30 MED ORDER — TRAMADOL HCL 50 MG PO TABS: 50.0000 mg | ORAL_TABLET | Freq: Three times a day (TID) | ORAL | Status: AC | PRN

## 2016-03-30 MED ORDER — PANTOPRAZOLE SODIUM 40 MG PO TBEC: 40.0000 mg | DELAYED_RELEASE_TABLET | Freq: Every day | ORAL | Status: DC

## 2016-03-30 MED ORDER — CYCLOBENZAPRINE HCL 10 MG PO TABS: ORAL_TABLET | ORAL | Status: AC

## 2016-03-30 NOTE — Patient Instructions (Signed)
F/u in 4 month, call if you need me sooner  EKG is entirely normal, which is great  I believe that your the pain in your back on the right is musculoskeletal, tramadol and flexeril are prescribed , also, please get a CXR today after you leave , we will call with result  Las today to include breath tets to see if you have bacterial exposure which may cause heartburn/ reflux causing the central chest pain.  I hope that you feel better sooon, please call if you do not  No suspicion of pneumonia at this time, no antibiotics have been prescribed  Thank you  for choosing [facility]. We consider it a privelige to serve you.  Delivering excellent health care in a caring and  compassionate way is our goal.  Partnering with you,  so that together we can achieve this goal is our strategy.

## 2016-03-30 NOTE — Assessment & Plan Note (Addendum)
5 day h/o right posterior chest pain aggravated by upper body movement, lungs CTA, no h/o fever or sputum, no hypoxia or respiratory distress Pain musculoskeletal, tramadol and flexeril prescribed CXR today

## 2016-03-30 NOTE — Assessment & Plan Note (Signed)
Controlled, no change in medication DASH diet and commitment to daily physical activity for a minimum of 30 minutes discussed and encouraged, as a part of hypertension management. The importance of attaining a healthy weight is also discussed.  BP/Weight 03/30/2016 09/04/2015 03/13/2015 02/13/2015 01/08/2015 [id number]  Systolic BP 123456 0000000 0000000 [id number] Q000111Q A999333 0000000  Diastolic BP 78 82 82 82 65 78 85  Wt. (Lbs) - 320 - - - - -  BMI - 54.9 - - - - -

## 2016-03-30 NOTE — Progress Notes (Signed)
Stefanie Taylor     MRN: [id number]      DOB: [birth year]   HPI [name] is here for follow up and re-evaluation of chronic medical conditions, medication management and review of any available recent lab and radiology data.  Preventive health is updated, specifically  Cancer screening and Immunization.    C/o Right posterior chest pain x 5 days, localized , aggravated by upper body movement, at its max it is a 6, associated with an early morning cough productive of green sputum, no fever or chills, energy level unchanged. 10 day h/o substernal chest discomfort , at times occurs when she bends over, last night was a 6 , and occurred when sitting up eating cake. Denies chronic reflux symptoms or dysphagia. No commited change in lifestyle , no weight loss that she is aware of ROS Denies recent fever or chills. Denies sinus pressure, nasal congestion, ear pain or sore throat. Denies chest congestion, productive cough Denies palpitations and leg swelling Denies abdominal pain, nausea, vomiting,diarrhea or constipation.   Denies dysuria, frequency, hesitancy or incontinence. Denies joint pain, swelling and limitation in mobility. Denies headaches, seizures, numbness, or tingling. Denies depression, anxiety or insomnia. Denies skin break down or rash.   PE  BP 118/78 mmHg  Pulse 70  Resp 18  Ht 5\' 4"  (1.626 m)  SpO2 98%  Patient alert and oriented and in no cardiopulmonary distress.  HEENT: No facial asymmetry, EOMI,   oropharynx pink and moist.  Neck supple no JVD, no mass. No sinus tenderness Chest: Clear to auscultation bilaterally.Reproducible posterior right chest pain on upper body movement , twisting to the side, and also on direct palpation No reproducible anterior chest pain  CVS: S1, S2 no murmurs, no S3.Regular rate. EKG: NSR, no ischemia , no LVH ABD: Soft non tender.   Ext: No edema  MS: Adequate ROM spine, shoulders, hips and knees.  Skin: Intact, no  ulcerations or rash noted.  Psych: Good eye contact, normal affect. Memory intact not anxious or depressed appearing.  CNS: CN 2-12 intact, power,  normal throughout.no focal deficits noted.   Assessment & Plan  Chest pain 5 day h/o right posterior chest pain aggravated by upper body movement, lungs CTA, no h/o fever or sputum, no hypoxia or respiratory distress Pain musculoskeletal, tramadol and flexeril prescribed CXR today  Substernal chest pain Office EKG : NSR , no LVH , no ischemia Needs H pylori breath test, history suggestive of reflux contributing to pain, as it occurs on occasion when she bends forward, trial of short course of protonix  HTN (hypertension) Controlled, no change in medication DASH diet and commitment to daily physical activity for a minimum of 30 minutes discussed and encouraged, as a part of hypertension management. The importance of attaining a healthy weight is also discussed.  BP/Weight 03/30/2016 09/04/2015 03/13/2015 02/13/2015 01/08/2015 [id number]  Systolic BP 123456 0000000 0000000 [id number] Q000111Q [id number] 0000000  Diastolic BP 78 82 82 82 65 78 85  Wt. (Lbs) - 320 - - - - -  BMI - 54.9 - - - - -        Morbid obesity, Weight - 324, BMI - 53.9.  Patient re-educated about  the importance of commitment to a  minimum of 150 minutes of exercise per week.  The importance of healthy food choices with portion control discussed. Encouraged to start a food diary, count calories and to consider  joining a support group. Sample diet sheets offered. Goals set by  the patient for the next several months.   Weight /BMI 03/30/2016 09/04/2015 03/13/2015  WEIGHT - 320 lb -  HEIGHT 5\' 4"  5\' 4"  5\' 0"   BMI - 54.9 kg/m2 -

## 2016-03-30 NOTE — Assessment & Plan Note (Signed)
Office EKG : NSR , no LVH , no ischemia Needs H pylori breath test, history suggestive of reflux contributing to pain, as it occurs on occasion when she bends forward, trial of short course of protonix

## 2016-03-30 NOTE — Assessment & Plan Note (Signed)
  Patient re-educated about  the importance of commitment to a  minimum of 150 minutes of exercise per week.  The importance of healthy food choices with portion control discussed. Encouraged to start a food diary, count calories and to consider  joining a support group. Sample diet sheets offered. Goals set by the patient for the next several months.   Weight /BMI 03/30/2016 09/04/2015 03/13/2015  WEIGHT - 320 lb -  HEIGHT 5\' 4"  5\' 4"  5\' 0"   BMI - 54.9 kg/m2 -

## 2016-03-31 ENCOUNTER — Other Ambulatory Visit: Payer: Self-pay | Admitting: Family Medicine

## 2016-03-31 LAB — COMPLETE METABOLIC PANEL WITH GFR
ALBUMIN: 4.1 g/dL (ref 3.6–5.1)
ALK PHOS: 79 U/L (ref 33–130)
ALT: 19 U/L (ref 6–29)
AST: 20 U/L (ref 10–35)
BILIRUBIN TOTAL: 0.6 mg/dL (ref 0.2–1.2)
BUN: 22 mg/dL (ref 7–25)
CALCIUM: 9.3 mg/dL (ref 8.6–10.4)
CO2: 26 mmol/L (ref 20–31)
CREATININE: 1.02 mg/dL — AB (ref 0.50–0.99)
Chloride: 105 mmol/L (ref 98–110)
GFR, EST AFRICAN AMERICAN: 69 mL/min (ref >=60)
GFR, EST NON AFRICAN AMERICAN: 60 mL/min (ref >=60)
Glucose, Bld: 91 mg/dL (ref 65–99)
Potassium: 4.3 mmol/L (ref 3.5–5.3)
Sodium: 139 mmol/L (ref 135–146)
TOTAL PROTEIN: 7.3 g/dL (ref 6.1–8.1)

## 2016-03-31 LAB — CBC WITH DIFFERENTIAL/PLATELET
BASOS ABS: 0 {cells}/uL (ref 0–200)
Basophils Relative: 0 %
EOS ABS: 192 {cells}/uL (ref 15–500)
Eosinophils Relative: 4 %
HEMATOCRIT: 39.5 % (ref 35.0–45.0)
HEMOGLOBIN: 13.1 g/dL (ref 11.7–15.5)
LYMPHS ABS: 1680 {cells}/uL (ref 850–3900)
Lymphocytes Relative: 35 %
MCH: 29.5 pg (ref 27.0–33.0)
MCHC: 33.2 g/dL (ref 32.0–36.0)
MCV: 89 fL (ref 80.0–100.0)
MONO ABS: 432 {cells}/uL (ref 200–950)
MONOS PCT: 9 %
MPV: 10.9 fL (ref 7.5–12.5)
NEUTROS ABS: 2496 {cells}/uL (ref 1500–7800)
Neutrophils Relative %: 52 %
Platelets: 199 10*3/uL (ref 140–400)
RBC: 4.44 MIL/uL (ref 3.80–5.10)
RDW: 14 % (ref 11.0–15.0)
WBC: 4.8 10*3/uL (ref 3.8–10.8)

## 2016-03-31 LAB — LIPID PANEL
Cholesterol: 160 mg/dL (ref 125–200)
HDL: 62 mg/dL (ref >=46)
LDL CALC: 82 mg/dL (ref <130)
Total CHOL/HDL Ratio: 2.6 Ratio (ref <=5.0)
Triglycerides: 80 mg/dL (ref <150)
VLDL: 16 mg/dL (ref <30)

## 2016-03-31 LAB — HEMOGLOBIN A1C
HEMOGLOBIN A1C: 5.1 % (ref <5.7)
MEAN PLASMA GLUCOSE: 100 mg/dL

## 2016-03-31 LAB — TSH: TSH: 2.77 mIU/L

## 2016-03-31 LAB — D-DIMER, QUANTITATIVE (NOT AT ARMC): D DIMER QUANT: 0.37 ug{FEU}/mL (ref <0.50)

## 2016-04-01 ENCOUNTER — Other Ambulatory Visit: Payer: Self-pay

## 2016-04-01 LAB — H. PYLORI BREATH TEST: H. pylori Breath Test: NOT DETECTED

## 2016-04-01 LAB — VITAMIN D 25 HYDROXY (VIT D DEFICIENCY, FRACTURES): VIT D 25 HYDROXY: 46 ng/mL (ref 30–100)

## 2016-04-01 MED ORDER — AZITHROMYCIN 250 MG PO TABS: ORAL_TABLET | ORAL | Status: DC

## 2016-04-30 ENCOUNTER — Other Ambulatory Visit: Payer: Self-pay | Admitting: Gastroenterology

## 2016-05-25 ENCOUNTER — Encounter (HOSPITAL_COMMUNITY): Payer: Self-pay | Admitting: *Deleted

## 2016-05-27 NOTE — Anesthesia Preprocedure Evaluation (Signed)
Anesthesia Evaluation  Patient identified by MRN, date of birth, ID band Patient awake    Reviewed: Allergy & Precautions, NPO status , Patient's Chart, lab work & pertinent test results  History of Anesthesia Complications Negative for: history of anesthetic complications  Airway Mallampati: III  TM Distance: >3 FB Neck ROM: Full    Dental no notable dental hx. (+) Dental Advisory Given   Pulmonary neg pulmonary ROS,    Pulmonary exam normal breath sounds clear to auscultation       Cardiovascular hypertension, Normal cardiovascular exam Rhythm:Regular Rate:Normal     Neuro/Psych negative neurological ROS  negative psych ROS   GI/Hepatic negative GI ROS, Neg liver ROS,   Endo/Other  Morbid obesity  Renal/GU negative Renal ROS  negative genitourinary   Musculoskeletal negative musculoskeletal ROS (+)   Abdominal (+) + obese,   Peds negative pediatric ROS (+)  Hematology negative hematology ROS (+)   Anesthesia Other Findings   Reproductive/Obstetrics negative OB ROS                             Anesthesia Physical Anesthesia Plan  ASA: III  Anesthesia Plan: MAC   Post-op Pain Management:    Induction: Intravenous  Airway Management Planned: Nasal Cannula  Additional Equipment:   Intra-op Plan:   Post-operative Plan:   Informed Consent: I have reviewed the patients History and Physical, chart, labs and discussed the procedure including the risks, benefits and alternatives for the proposed anesthesia with the patient or authorized representative who has indicated his/her understanding and acceptance.   Dental advisory given  Plan Discussed with: CRNA  Anesthesia Plan Comments:         Anesthesia Quick Evaluation

## 2016-05-27 NOTE — H&P (Signed)
  Thy Bender HPI: This 60 year old black female presents to the office for colorectal cancer screening. She has 1 BM per day with no obvious blood or mucus in the stool. She has good appetite and has gained 12 pounds in the last year. She denies having any complaints of abdominal pain, nausea, vomiting, acid reflux, dysphagia or odynophagia. She denies having a family history of colon cancer, celiac sprue or IBD. Her last colonoscopy done on 05/27/2006 revealed internal and external hemorrhoids and melanosis coli was noted.   Past Medical History:  Diagnosis Date  . H/O sickle cell trait   . Hypertension   . Morbid obesity with BMI of 50.0-59.9, adult ([place]) 08/15/12   BMI: 53.1 kg/m^2    Past Surgical History:  Procedure Laterality Date  . BIOPSY BREAST    . BREAST SURGERY  1998   biopsy mole on right breast  . BREATH TEK H PYLORI  09/04/2012   Procedure: BREATH TEK H PYLORI;  Surgeon: Shann Medal, MD;  Location: [facility];  Service: General;  Laterality: N/A;  . BREATH TEK H PYLORI N/A 11/06/2012   Procedure: Lauris Chroman;  Surgeon: Shann Medal, MD;  Location: [facility];  Service: General;  Laterality: N/A;  . TUBAL LIGATION    . UMBILICAL HERNIA REPAIR     age 34    Family History  Problem Relation Age of Onset  . Hypertension Mother   . Heart disease Mother   . Cancer Mother   . Hypertension Father   . Alcohol abuse Sister   . Hypertension Sister   . Diabetes Brother   . Hypertension Brother   . Diabetes Sister   . Hypertension Sister   . Diabetes Sister   . Hypertension Sister   . Diabetes Brother   . Hypertension Brother   . Diabetes Brother   . Hypertension Brother   . Diabetes Brother   . Hypertension Brother   . Hypertension Brother     Social History:  reports that she has never smoked. She has never used smokeless tobacco. She reports that she drinks alcohol. She reports that she does not use drugs.  Allergies:  Allergies  Allergen  Reactions  . Sulfur   . Prednisone   . Lasix [Furosemide] Itching and Swelling  . Ibuprofen Itching and Rash  . Penicillins Rash    Medications:  Scheduled:  Continuous: . sodium chloride    . lactated ringers 1,000 mL (05/28/16 0812)    No results found for this or any previous visit (from the past 24 hour(s)).   No results found.  ROS:  As stated above in the HPI otherwise negative.  There were no vitals taken for this visit.    PE: Gen: NAD, Alert and Oriented HEENT:  Price/AT, EOMI Neck: Supple, no LAD Lungs: CTA Bilaterally CV: RRR without M/G/R ABM: Soft, NTND, +BS Ext: No C/C/E  Assessment/Plan: 1) Screening colonoscopy.  Fernando Torry D 05/27/2016, 8:45 AM

## 2016-05-28 ENCOUNTER — Encounter (HOSPITAL_COMMUNITY): Payer: Self-pay

## 2016-05-28 ENCOUNTER — Ambulatory Visit (HOSPITAL_COMMUNITY)
Admission: RE | Admit: 2016-05-28 | Discharge: 2016-05-28 | Disposition: A | Discharge status: Home / Self Care | Payer: BC Managed Care – PPO | Source: Ambulatory Visit | Attending: Gastroenterology | Admitting: Gastroenterology

## 2016-05-28 ENCOUNTER — Ambulatory Visit (HOSPITAL_COMMUNITY): Payer: BC Managed Care – PPO | Admitting: Anesthesiology

## 2016-05-28 ENCOUNTER — Encounter (HOSPITAL_COMMUNITY)
Admission: RE | Disposition: A | Discharge status: Home / Self Care | Payer: Self-pay | Source: Ambulatory Visit | Attending: Gastroenterology

## 2016-05-28 DIAGNOSIS — K5730 Diverticulosis of large intestine without perforation or abscess without bleeding: Secondary | ICD-10-CM | POA: Diagnosis not present

## 2016-05-28 DIAGNOSIS — D573 Sickle-cell trait: Secondary | ICD-10-CM | POA: Diagnosis not present

## 2016-05-28 DIAGNOSIS — Z1211 Encounter for screening for malignant neoplasm of colon: Secondary | ICD-10-CM | POA: Insufficient documentation

## 2016-05-28 DIAGNOSIS — D122 Benign neoplasm of ascending colon: Secondary | ICD-10-CM | POA: Diagnosis not present

## 2016-05-28 DIAGNOSIS — Z6841 Body Mass Index (BMI) 40.0 and over, adult: Secondary | ICD-10-CM | POA: Diagnosis not present

## 2016-05-28 DIAGNOSIS — I10 Essential (primary) hypertension: Secondary | ICD-10-CM | POA: Diagnosis not present

## 2016-05-28 DIAGNOSIS — D120 Benign neoplasm of cecum: Secondary | ICD-10-CM | POA: Insufficient documentation

## 2016-05-28 HISTORY — DX: Personal history of diseases of the blood and blood-forming organs and certain disorders involving the immune mechanism: Z86.2

## 2016-05-28 HISTORY — PX: COLONOSCOPY WITH PROPOFOL: SHX5780

## 2016-05-28 SURGERY — COLONOSCOPY WITH PROPOFOL
Anesthesia: Monitor Anesthesia Care

## 2016-05-28 MED ORDER — LIDOCAINE 2% (20 MG/ML) 5 ML SYRINGE
INTRAMUSCULAR | Status: AC
  Filled 2016-05-28: qty 5

## 2016-05-28 MED ORDER — PROPOFOL 10 MG/ML IV BOLUS
INTRAVENOUS | Status: DC | PRN
  Administered 2016-05-28 (×2): 20 mg via INTRAVENOUS
  Administered 2016-05-28 (×2): 10 mg via INTRAVENOUS

## 2016-05-28 MED ORDER — KETAMINE HCL 10 MG/ML IJ SOLN
INTRAMUSCULAR | Status: DC | PRN
  Administered 2016-05-28 (×4): 7 mg via INTRAVENOUS
  Administered 2016-05-28: 8 mg via INTRAVENOUS
  Administered 2016-05-28 (×2): 7 mg via INTRAVENOUS

## 2016-05-28 MED ORDER — PROPOFOL 10 MG/ML IV BOLUS
INTRAVENOUS | Status: AC
  Filled 2016-05-28: qty 80

## 2016-05-28 MED ORDER — SODIUM CHLORIDE 0.9 % IV SOLN: INTRAVENOUS | Status: DC

## 2016-05-28 MED ORDER — LACTATED RINGERS IV SOLN
INTRAVENOUS | Status: DC
  Administered 2016-05-28: 1000 mL via INTRAVENOUS

## 2016-05-28 MED ORDER — PROPOFOL 500 MG/50ML IV EMUL
INTRAVENOUS | Status: DC | PRN
  Administered 2016-05-28: 100 ug/kg/min via INTRAVENOUS

## 2016-05-28 MED ORDER — LIDOCAINE 2% (20 MG/ML) 5 ML SYRINGE
INTRAMUSCULAR | Status: DC | PRN
  Administered 2016-05-28: 50 mg via INTRAVENOUS

## 2016-05-28 MED ORDER — KETAMINE HCL 10 MG/ML IJ SOLN
INTRAMUSCULAR | Status: AC
  Filled 2016-05-28: qty 1

## 2016-05-28 SURGICAL SUPPLY — 21 items

## 2016-05-28 NOTE — Op Note (Signed)
[facility] Patient Name: Stefanie Taylor Procedure Date: 05/28/2016 MRN: [id number] Attending MD: Carol Ada , MD Date of Birth: 05-07-1956 CSN: [id number] Age: 60 Admit Type: Outpatient Procedure:                Colonoscopy Indications:              Screening for colorectal malignant neoplasm Providers:                Carol Ada, MD, Hilma Favors, RN, William Dalton, Technician Referring MD:              Medicines:                Propofol per Anesthesia Complications:            No immediate complications. Estimated Blood Loss:     Estimated blood loss: none. Procedure:                Pre-Anesthesia Assessment:                           - Prior to the procedure, a History and Physical                            was performed, and patient medications and                            allergies were reviewed. The patient's tolerance of                            previous anesthesia was also reviewed. The risks                            and benefits of the procedure and the sedation                            options and risks were discussed with the patient.                            All questions were answered, and informed consent                            was obtained. Prior Anticoagulants: The patient has                            taken no previous anticoagulant or antiplatelet                            agents. ASA Grade Assessment: III - A patient with                            severe systemic disease. After reviewing the risks  and benefits, the patient was deemed in                            satisfactory condition to undergo the procedure.                           After obtaining informed consent, the colonoscope                            was passed under direct vision. Throughout the                            procedure, the patient's blood pressure, pulse, and   oxygen saturations were monitored continuously. The                            [id number]) scope was introduced through                            the anus and advanced to the the cecum, identified                            by appendiceal orifice and ileocecal valve. The                            colonoscopy was performed without difficulty. The                            patient tolerated the procedure well. The quality                            of the bowel preparation was excellent. The                            ileocecal valve, appendiceal orifice, and rectum                            were photographed. Findings:      A 10 mm polyp was found in the cecum. The polyp was sessile. The polyp       was removed with a hot snare. Resection and retrieval were complete.      A 3 mm polyp was found in the ascending colon. The polyp was sessile.       The polyp was removed with a cold snare. Resection and retrieval were       complete.      A few small-mouthed diverticula were found in the descending colon. Impression:               - One 10 mm polyp in the cecum, removed with a hot                            snare. Resected and retrieved.                           - One 3 mm polyp  in the ascending colon, removed                            with a cold snare. Resected and retrieved.                           - Diverticulosis in the descending colon. Moderate Sedation:      N/A- Per Anesthesia Care Recommendation:           - Patient has a contact number available for                            emergencies. The signs and symptoms of potential                            delayed complications were discussed with the                            patient. Return to normal activities tomorrow.                            Written discharge instructions were provided to the                            patient.                           - Resume previous diet.                           -  Continue present medications.                           - Await pathology results.                           - Repeat colonoscopy in 3 years for surveillance. Procedure Code(s):        --- Professional ---                           [phone], Colonoscopy, flexible; with removal of                            tumor(s), polyp(s), or other lesion(s) by snare                            technique Diagnosis Code(s):        --- Professional ---                           Z12.11, Encounter for screening for malignant                            neoplasm of colon                           D12.0, Benign neoplasm of cecum  D12.2, Benign neoplasm of ascending colon                           K57.30, Diverticulosis of large intestine without                            perforation or abscess without bleeding CPT copyright 2016 American Medical Association. All rights reserved. The codes documented in this report are preliminary and upon coder review may  be revised to meet current compliance requirements. Carol Ada, MD Carol Ada, MD 05/28/2016 9:15:58 AM This report has been signed electronically. Number of Addenda: 0

## 2016-05-28 NOTE — Discharge Instructions (Signed)
YOU HAD AN ENDOSCOPIC PROCEDURE TODAY: Refer to the procedure report and other information in the discharge instructions given to you for any specific questions about what was found during the examination. If this information does not answer your questions, please call [facility] at [phone] to clarify.  ° °YOU SHOULD EXPECT: Some feelings of bloating in the abdomen. Passage of more gas than usual. Walking can help get rid of the air that was put into your GI tract during the procedure and reduce the bloating. If you had a lower endoscopy (such as a colonoscopy or flexible sigmoidoscopy) you may notice spotting of blood in your stool or on the toilet paper. Some abdominal soreness may be present for a day or two, also. ° °DIET: Your first meal following the procedure should be a light meal and then it is ok to progress to your normal diet. A half-sandwich or bowl of soup is an example of a good first meal. Heavy or fried foods are harder to digest and may make you feel nauseous or bloated. Drink plenty of fluids but you should avoid alcoholic beverages for 24 hours. If you had an esophageal dilation, please see attached information for diet.  ° °ACTIVITY: Your care partner should take you home directly after the procedure. You should plan to take it easy, moving slowly for the rest of the day. You can resume normal activity the day after the procedure however YOU SHOULD NOT DRIVE, use power tools, machinery or perform tasks that involve climbing or major physical exertion for 24 hours (because of the sedation medicines used during the test).  ° °SYMPTOMS TO REPORT IMMEDIATELY: °A gastroenterologist can be reached at any hour. Please call [phone]  for any of the following symptoms:  °Following lower endoscopy (colonoscopy, flexible sigmoidoscopy) °Excessive amounts of blood in the stool  °Significant tenderness, worsening of abdominal pains  °Swelling of the abdomen that is new, acute  °Fever of  100° or higher  °Following upper endoscopy (EGD, EUS, ERCP, esophageal dilation) °Vomiting of blood or coffee ground material  °New, significant abdominal pain  °New, significant chest pain or pain under the shoulder blades  °Painful or persistently difficult swallowing  °New shortness of breath  °Black, tarry-looking or red, bloody stools ° °FOLLOW UP:  °If any biopsies were taken you will be contacted by phone or by letter within the next 1-3 weeks. Call [phone]  if you have not heard about the biopsies in 3 weeks.  °Please also call with any specific questions about appointments or follow up tests. ° °

## 2016-05-28 NOTE — Transfer of Care (Signed)
Immediate Anesthesia Transfer of Care Note  Patient: Stefanie Taylor  Procedure(s) Performed: Procedure(s): COLONOSCOPY WITH PROPOFOL (N/A)  Patient Location: PACU  Anesthesia Type:MAC  Level of Consciousness: Patient easily awoken, sedated, comfortable, cooperative, following commands, responds to stimulation.   Airway & Oxygen Therapy: Patient spontaneously breathing, ventilating well, oxygen via simple oxygen mask.  Post-op Assessment: Report given to PACU RN, vital signs reviewed and stable, moving all extremities.   Post vital signs: Reviewed and stable.  Complications: No apparent anesthesia complications Last Vitals:  Vitals:   05/28/16 0745 05/28/16 0921  BP: 129/69 114/64  Pulse: 76 76  Resp: 17 18  Temp: 36.6 C     Last Pain:  Vitals:   05/28/16 0745  TempSrc: Oral         Complications: No apparent anesthesia complications

## 2016-05-28 NOTE — Anesthesia Postprocedure Evaluation (Signed)
Anesthesia Post Note  Patient: Stefanie Taylor  Procedure(s) Performed: Procedure(s) (LRB): COLONOSCOPY WITH PROPOFOL (N/A)  Patient location during evaluation: PACU Anesthesia Type: MAC Level of consciousness: awake and alert Pain management: pain level controlled Vital Signs Assessment: post-procedure vital signs reviewed and stable Respiratory status: spontaneous breathing, nonlabored ventilation, respiratory function stable and patient connected to nasal cannula oxygen Cardiovascular status: stable and blood pressure returned to baseline Anesthetic complications: no    Last Vitals:  Vitals:   05/28/16 0935 05/28/16 0940  BP:  130/65  Pulse: 66 61  Resp: (!) 22 16  Temp:      Last Pain:  Vitals:   05/28/16 0930  TempSrc: Oral                 [name]

## 2016-05-31 ENCOUNTER — Encounter (HOSPITAL_COMMUNITY): Payer: Self-pay | Admitting: Gastroenterology

## 2016-06-21 ENCOUNTER — Other Ambulatory Visit: Payer: Self-pay | Admitting: Family Medicine

## 2016-07-14 ENCOUNTER — Encounter: Payer: Self-pay | Admitting: Family Medicine

## 2016-07-14 ENCOUNTER — Ambulatory Visit (INDEPENDENT_AMBULATORY_CARE_PROVIDER_SITE_OTHER): Payer: BC Managed Care – PPO | Admitting: Family Medicine

## 2016-07-14 ENCOUNTER — Ambulatory Visit (HOSPITAL_COMMUNITY)
Admission: RE | Admit: 2016-07-14 | Discharge: 2016-07-14 | Disposition: A | Discharge status: Home / Self Care | Payer: BC Managed Care – PPO | Source: Ambulatory Visit | Attending: Family Medicine | Admitting: Family Medicine

## 2016-07-14 VITALS — BP 130/82 | HR 81 | Ht 64.0 in

## 2016-07-14 DIAGNOSIS — I10 Essential (primary) hypertension: Secondary | ICD-10-CM

## 2016-07-14 DIAGNOSIS — M25521 Pain in right elbow: Secondary | ICD-10-CM | POA: Diagnosis not present

## 2016-07-14 DIAGNOSIS — M25821 Other specified joint disorders, right elbow: Secondary | ICD-10-CM | POA: Insufficient documentation

## 2016-07-14 DIAGNOSIS — Z23 Encounter for immunization: Secondary | ICD-10-CM

## 2016-07-14 DIAGNOSIS — M25529 Pain in unspecified elbow: Secondary | ICD-10-CM | POA: Insufficient documentation

## 2016-07-14 NOTE — Progress Notes (Signed)
   Stefanie Taylor     MRN: [id number]      DOB: 1955/10/14   HPI Stefanie Taylor is here with an approx 7 week h/o right arm pain with movement of the elbow, as in typing and wiping her buttock, rated at an 8. Symptoms started following insertion of an IV when she had a colonoscopy on Sept 15, 2017. She states that during the actual insertion she experienced  Pain and locates on medial aspect of right arm location of insertion  States she was trying to "ride it out' but symptoms remain and are not improving, her activities are limited by pain  No regular exercise, limited by sciatica and right arm pain,. Still feels as though her extra calories are from wine , which she needs to work on, unwilliung to weight today  ROS Denies recent fever or chills. Denies sinus pressure, nasal congestion, ear pain or sore throat. Denies chest congestion, productive cough or wheezing. Denies chest pains, palpitations and leg swelling . Denies headaches, seizures, numbness, or tingling.    PE  BP 130/82   Pulse 81   Ht 5\' 4"  (1.626 m)   SpO2 96%   Patient alert and oriented and in no cardiopulmonary distress.  HEENT: No facial asymmetry, EOMI,   t.  Neck supple  Chest: Clear to auscultation bilaterally.  CVS: S1, S2 no murmurs, no S3.Regular rate.  Ext: No edema  MS: Adequate ROM spine, shoulders, hips and knees. Decreased ROM right elbow, limitation in reaching around to back, no warmth, redness or tenderness to palpation over the joint Skin: Intact, no ulcerations or rash noted.Hyperpigmented area on medial aspect of right forearm, in location that pt reports having the IV placed, no redness, drainage or warmth in the area   Psych: Good eye contact, normal affect. Memory intact not anxious or depressed appearing.  CNS: CN 2-12 intact, power,  normal throughout.no focal deficits noted.   Assessment & Plan  Pain in joint, upper arm 7 week h/o debilitating pain rated at an 8,  following placement of an iV for colonoscopy. Will obtain x ray of the joint the pain seems to be soft tissue likely from nerve irritation in the are. Fact that there is ongoing pain and limitation in function of the elbow will refer initially to orthopedics, may require rehab, pt is allergic to  NSAIDS so no med prescribed.at this visit   HTN (hypertension) Controlled, no change in medication DASH diet and commitment to daily physical activity for a minimum of 30 minutes discussed and encouraged, as a part of hypertension management. The importance of attaining a healthy weight is also discussed.  BP/Weight 07/14/2016 05/28/2016 03/30/2016 09/04/2015 03/13/2015 02/13/2015 [id number]  Systolic BP [id number]  Diastolic BP 82 65 78 82 82 82 65  Wt. (Lbs) - 320 - 320 - - -  BMI - 54.93 - 54.9 - - -       Need for prophylactic vaccination and inoculation against influenza After obtaining informed consent, the vaccine is  administered by LPN.

## 2016-07-14 NOTE — Assessment & Plan Note (Signed)
After obtaining informed consent, the vaccine is  administered by LPN.  

## 2016-07-14 NOTE — Patient Instructions (Addendum)
F/u as before, call if you need me before  You are being referred for evaluation right arm pain , and please get x ray of right elbow, orders in to local hospital    Flu vaccine today  PLEASE read the recommended book!  ANSWER for yourrself the question, then lets start's to act

## 2016-07-14 NOTE — Assessment & Plan Note (Signed)
Controlled, no change in medication DASH diet and commitment to daily physical activity for a minimum of 30 minutes discussed and encouraged, as a part of hypertension management. The importance of attaining a healthy weight is also discussed.  BP/Weight 07/14/2016 05/28/2016 03/30/2016 09/04/2015 03/13/2015 02/13/2015 [id number]  Systolic BP [id number] [id number]  Diastolic BP 82 65 78 82 82 82 65  Wt. (Lbs) - 320 - 320 - - -  BMI - 54.93 - 54.9 - - -

## 2016-07-14 NOTE — Assessment & Plan Note (Signed)
7 week h/o debilitating pain rated at an 8, following placement of an iV for colonoscopy. Will obtain x ray of the joint the pain seems to be soft tissue likely from nerve irritation in the are. Fact that there is ongoing pain and limitation in function of the elbow will refer initially to orthopedics, may require rehab, pt is allergic to  NSAIDS so no med prescribed.at this visit

## 2016-07-15 ENCOUNTER — Telehealth: Payer: Self-pay | Admitting: Family Medicine

## 2016-07-15 NOTE — Telephone Encounter (Signed)
LM to call back to reschedule apt. In November

## 2016-07-20 ENCOUNTER — Other Ambulatory Visit: Payer: Self-pay | Admitting: Family Medicine

## 2016-07-20 DIAGNOSIS — I10 Essential (primary) hypertension: Secondary | ICD-10-CM

## 2016-08-03 ENCOUNTER — Ambulatory Visit: Payer: BC Managed Care – PPO | Admitting: Family Medicine

## 2016-08-09 ENCOUNTER — Telehealth: Payer: Self-pay

## 2016-08-09 NOTE — Telephone Encounter (Signed)
Patient's secretary called and said pt has been sick since last week with thick yellow nasal congestion and cough and feels bad. She is in court today and unable to come in for a visit but its not going away and wants something called into [phone]

## 2016-08-09 NOTE — Telephone Encounter (Signed)
pls call pt on cell [phone] I spoke with her secretary, she is not listed as someone we v can communicate with about pt's health. Secretary is aware and states she is also sending pt a "text message' to explain the situation  I wlll start her on medication butshe needs to directly contact office

## 2016-08-10 ENCOUNTER — Ambulatory Visit (INDEPENDENT_AMBULATORY_CARE_PROVIDER_SITE_OTHER): Payer: BC Managed Care – PPO | Admitting: Family Medicine

## 2016-08-10 ENCOUNTER — Other Ambulatory Visit: Payer: Self-pay | Admitting: Family Medicine

## 2016-08-10 ENCOUNTER — Telehealth: Payer: Self-pay

## 2016-08-10 ENCOUNTER — Encounter: Payer: Self-pay | Admitting: Family Medicine

## 2016-08-10 VITALS — BP 138/82 | HR 72 | Temp 98.4°F | Resp 16 | Ht 64.0 in

## 2016-08-10 DIAGNOSIS — J0100 Acute maxillary sinusitis, unspecified: Secondary | ICD-10-CM

## 2016-08-10 DIAGNOSIS — I10 Essential (primary) hypertension: Secondary | ICD-10-CM

## 2016-08-10 DIAGNOSIS — J209 Acute bronchitis, unspecified: Secondary | ICD-10-CM

## 2016-08-10 MED ORDER — AZITHROMYCIN 250 MG PO TABS: ORAL_TABLET | ORAL | 0 refills | Status: DC

## 2016-08-10 MED ORDER — BENZONATATE 100 MG PO CAPS: 100.0000 mg | ORAL_CAPSULE | Freq: Two times a day (BID) | ORAL | 0 refills | Status: DC | PRN

## 2016-08-10 MED ORDER — PROMETHAZINE-DM 6.25-15 MG/5ML PO SYRP: ORAL_SOLUTION | ORAL | 0 refills | Status: DC

## 2016-08-10 NOTE — Progress Notes (Signed)
azithro

## 2016-08-10 NOTE — Patient Instructions (Addendum)
F/U as before.  Call if you need me before, call if worsening over next 1 to 3 days, CXR and labs will be ordered at that time  Work excuse start today return 08/16/2016  You are treated for acute sinusitis and bronchitis, z pack, tessalon perles and cough syrup for bedtime use prescribed   FLUIDS an d rest   please   Sinusitis, Adult Sinusitis is soreness and inflammation of your sinuses. Sinuses are hollow spaces in the bones around your face. They are located:  Around your eyes.  In the middle of your forehead.  Behind your nose.  In your cheekbones. Your sinuses and nasal passages are lined with a stringy fluid (mucus). Mucus normally drains out of your sinuses. When your nasal tissues get inflamed or swollen, the mucus can get trapped or blocked so air cannot flow through your sinuses. This lets bacteria, viruses, and funguses grow, and that leads to infection. Follow these instructions at home: Medicines  Take, use, or apply over-the-counter and prescription medicines only as told by your doctor. These may include nasal sprays.  If you were prescribed an antibiotic medicine, take it as told by your doctor. Do not stop taking the antibiotic even if you start to feel better. Hydrate and Humidify  Drink enough water to keep your pee (urine) clear or pale yellow.  Use a cool mist humidifier to keep the humidity level in your home above 50%.  Breathe in steam for 10-15 minutes, 3-4 times a day or as told by your doctor. You can do this in the bathroom while a hot shower is running.  Try not to spend time in cool or dry air. Rest  Rest as much as possible.  Sleep with your head raised (elevated).  Make sure to get enough sleep each night. General instructions  Put a warm, moist washcloth on your face 3-4 times a day or as told by your doctor. This will help with discomfort.  Wash your hands often with soap and water. If there is no soap and water, use hand  sanitizer.  Do not smoke. Avoid being around people who are smoking (secondhand smoke).  Keep all follow-up visits as told by your doctor. This is important. Contact a doctor if:  You have a fever.  Your symptoms get worse.  Your symptoms do not get better within 10 days. Get help right away if:  You have a very bad headache.  You cannot stop throwing up (vomiting).  You have pain or swelling around your face or eyes.  You have trouble seeing.  You feel confused.  Your neck is stiff.  You have trouble breathing. This information is not intended to replace advice given to you by your health care provider. Make sure you discuss any questions you have with your health care provider. Document Released: 02/16/2008 Document Revised: 04/25/2016 Document Reviewed: 06/25/2015 Elsevier Interactive Patient Education  2017 [employer]  Acute Bronchitis, Adult Acute bronchitis is when air tubes (bronchi) in the lungs suddenly get swollen. The condition can make it hard to breathe. It can also cause these symptoms:  A cough.  Coughing up clear, yellow, or green mucus.  Wheezing.  Chest congestion.  Shortness of breath.  A fever.  Body aches.  Chills.  A sore throat. Follow these instructions at home: Medicines  Take over-the-counter and prescription medicines only as told by your doctor.  If you were prescribed an antibiotic medicine, take it as told by your doctor. Do not  stop taking the antibiotic even if you start to feel better. General instructions  Rest.  Drink enough fluids to keep your pee (urine) clear or pale yellow.  Avoid smoking and secondhand smoke. If you smoke and you need help quitting, ask your doctor. Quitting will help your lungs heal faster.  Use an inhaler, cool mist vaporizer, or humidifier as told by your doctor.  Keep all follow-up visits as told by your doctor. This is important. How is this prevented? To lower your risk of getting  this condition again:  Wash your hands often with soap and water. If you cannot use soap and water, use hand sanitizer.  Avoid contact with people who have cold symptoms.  Try not to touch your hands to your mouth, nose, or eyes.  Make sure to get the flu shot every year. Contact a doctor if:  Your symptoms do not get better in 2 weeks. Get help right away if:  You cough up blood.  You have chest pain.  You have very bad shortness of breath.  You become dehydrated.  You faint (pass out) or keep feeling like you are going to pass out.  You keep throwing up (vomiting).  You have a very bad headache.  Your fever or chills gets worse. This information is not intended to replace advice given to you by your health care provider. Make sure you discuss any questions you have with your health care provider. Document Released: 02/16/2008 Document Revised: 04/07/2016 Document Reviewed: 02/18/2016 Elsevier Interactive Patient Education  2017 [employer].

## 2016-08-10 NOTE — Telephone Encounter (Signed)
Patient will come in for work in at 1:30

## 2016-08-10 NOTE — Telephone Encounter (Signed)
oV recommended, I WILL work her in today. If aBsolutely unable to come in pls document and send the historically entered tessalon perles and z pack. If elects not to come and worsens nEEDS to be cinically evaluated, pls explain

## 2016-08-12 NOTE — Telephone Encounter (Signed)
Pt came in for ov.

## 2016-08-18 ENCOUNTER — Encounter: Payer: Self-pay | Admitting: Family Medicine

## 2016-08-18 DIAGNOSIS — J0100 Acute maxillary sinusitis, unspecified: Secondary | ICD-10-CM | POA: Insufficient documentation

## 2016-08-18 DIAGNOSIS — J209 Acute bronchitis, unspecified: Secondary | ICD-10-CM | POA: Insufficient documentation

## 2016-08-18 NOTE — Assessment & Plan Note (Signed)
Decongestant , antibiotic and work excuse written

## 2016-08-18 NOTE — Progress Notes (Signed)
   Stefanie Taylor     MRN: [id number]      DOB: [birth year]   HPI Stefanie Taylor is here   1 week h/o worsening head and chest congestion, associated with fever and chills intermittently. Nasal drainage has thickened , and is yellowish green, and at times bloody. Sputum is thick and yellow. C/o bilateral ear pressure, denies hearing loss and sore throat. Increasing fatigue , poor appetitie and sleep disturbed by cough. No improvement with OTC medication.  ROS  Denies pND , orthopnea, palpitations and leg swelling Denies abdominal pain, nausea, vomiting,diarrhea or constipation.   Denies dysuria, frequency, hesitancy or incontinence. Denies joint pain, swelling and limitation in mobility. Denies headaches, seizures, numbness, or tingling. Denies depression, anxiety or insomnia. Denies skin break down or rash.   PE  BP 138/82 (BP Location: Left Arm, Patient Position: Sitting, Cuff Size: Large)   Pulse 72   Temp 98.4 F (36.9 C) (Oral)   Resp 16   Ht 5\' 4"  (1.626 m)   SpO2 99%   Patient alert and oriented and in no cardiopulmonary distress.Ill appearing  HEENT: No facial asymmetry, EOMI,   oropharynx pink and moist.  Neck supple no JVD, no mass.Maxillary sinus tenderness, bilateral anterior cervical adenitis  Chest: adequate air entry, bilateral crackles , few wheezes.  CVS: S1, S2 no murmurs, no S3.Regular rate.  ABD: Soft non tender.   Ext: No edema  MS: Adequate ROM spine, shoulders, hips and knees.  Skin: Intact, no ulcerations or rash noted.  Psych: Good eye contact, normal affect. Memory intact not anxious or depressed appearing.  CNS: CN 2-12 intact, power,  normal throughout.no focal deficits noted.   Assessment & Plan  Acute maxillary sinusitis Antibiotic course prescribed, fluids and rest  Acute bronchitis Decongestant , antibiotic and work excuse written  HTN (hypertension) Controlled, no change in medication DASH diet and commitment to daily  physical activity for a minimum of 30 minutes discussed and encouraged, as a part of hypertension management. The importance of attaining a healthy weight is also discussed.  BP/Weight 08/10/2016 07/14/2016 05/28/2016 03/30/2016 09/04/2015 0000000 [id number]  Systolic BP 0000000 [id number] 123456 0000000 0000000 [id number]  Diastolic BP 82 82 65 78 82 82 82  Wt. (Lbs) - - 320 - 320 - -  BMI - - 54.93 - 54.9 - -

## 2016-08-18 NOTE — Assessment & Plan Note (Signed)
Controlled, no change in medication DASH diet and commitment to daily physical activity for a minimum of 30 minutes discussed and encouraged, as a part of hypertension management. The importance of attaining a healthy weight is also discussed.  BP/Weight 08/10/2016 07/14/2016 05/28/2016 03/30/2016 09/04/2015 0000000 [id number]  Systolic BP 0000000 [id number] 123456 0000000 0000000 [id number]  Diastolic BP 82 82 65 78 82 82 82  Wt. (Lbs) - - 320 - 320 - -  BMI - - 54.93 - 54.9 - -

## 2016-08-18 NOTE — Assessment & Plan Note (Signed)
Antibiotic course prescribed, fluids and rest

## 2016-08-26 ENCOUNTER — Encounter: Payer: Self-pay | Admitting: Family Medicine

## 2016-08-26 ENCOUNTER — Ambulatory Visit (INDEPENDENT_AMBULATORY_CARE_PROVIDER_SITE_OTHER): Payer: BC Managed Care – PPO | Admitting: Family Medicine

## 2016-08-26 VITALS — BP 118/80 | HR 71 | Resp 16 | Ht 64.0 in

## 2016-08-26 DIAGNOSIS — M25529 Pain in unspecified elbow: Secondary | ICD-10-CM | POA: Diagnosis not present

## 2016-08-26 DIAGNOSIS — I10 Essential (primary) hypertension: Secondary | ICD-10-CM | POA: Diagnosis not present

## 2016-08-26 MED ORDER — FUROSEMIDE 20 MG PO TABS: ORAL_TABLET | ORAL | 1 refills | Status: DC

## 2016-08-26 NOTE — Assessment & Plan Note (Signed)
Controlled, no change in medication DASH diet and commitment to daily physical activity for a minimum of 30 minutes discussed and encouraged, as a part of hypertension management. The importance of attaining a healthy weight is also discussed.  BP/Weight 08/26/2016 08/10/2016 07/14/2016 05/28/2016 03/30/2016 09/04/2015 0000000  Systolic BP 123456 0000000 [id number] 123456 0000000 0000000  Diastolic BP 80 82 82 65 78 82 82  Wt. (Lbs) - - - 320 - 320 -  BMI - - - 54.93 - 54.9 -

## 2016-08-26 NOTE — Assessment & Plan Note (Signed)
Improving but persists, has upcoming appt for further evaluation

## 2016-08-26 NOTE — Assessment & Plan Note (Signed)
Refuses to weigh, no evident weight loss, lifestyle change a work in progress

## 2016-08-26 NOTE — Progress Notes (Signed)
   Stefanie Taylor     MRN: [id number]      DOB: 08-29-1956   HPI Stefanie Taylor is here for follow up and re-evaluation of chronic medical conditions, medication management and review of any available recent lab and radiology data.  Preventive health is updated, specifically  Cancer screening and Immunization.   Questions or concerns regarding consultations or procedures which the PT has had in the interim are  addressed. The PT denies any adverse reactions to current medications since the last visit.  1 week h/o left ankle  swelling, requests medication, but due to sulfa allergy , elects to forgo this and focus on compression hose, salt restriction and leg elevation   ROS Denies recent fever or chills. Denies sinus pressure, nasal congestion, ear pain or sore throat. Denies chest congestion, productive cough  Denies chest pain, palpitations, PND or orthopnea Denies abdominal pain, nausea, vomiting,diarrhea or constipation.   Denies dysuria, frequency, hesitancy or incontinence. Denies joint pain, swelling and limitation in mobility. Denies headaches, seizures, numbness, or tingling. Denies depression, anxiety or insomnia. Denies skin break down or rash.   PE  BP 118/80   Pulse 71   Resp 16   Ht 5\' 4"  (1.626 m)   SpO2 98%   Patient alert and oriented and in no cardiopulmonary distress.  HEENT: No facial asymmetry, EOMI,   oropharynx pink and moist.  Neck supple no JVD, no mass.  Chest: Clear to auscultation bilaterally.  CVS: S1, S2 no murmurs, no S3.Regular rate.  ABD: Soft non tender.   Ext: Trace edema of left foot and ankle  MS: Adequate ROM spine, shoulders, hips and knees.  Skin: Intact, no ulcerations or rash noted.  Psych: Good eye contact, normal affect. Memory intact not anxious or depressed appearing.  CNS: CN 2-12 intact, power,  normal throughout.no focal deficits noted.   Assessment & Plan  HTN (hypertension) Controlled, no change in  medication DASH diet and commitment to daily physical activity for a minimum of 30 minutes discussed and encouraged, as a part of hypertension management. The importance of attaining a healthy weight is also discussed.  BP/Weight 08/26/2016 08/10/2016 07/14/2016 05/28/2016 03/30/2016 09/04/2015 0000000  Systolic BP 123456 0000000 [id number] 123456 0000000 0000000  Diastolic BP 80 82 82 65 78 82 82  Wt. (Lbs) - - - 320 - 320 -  BMI - - - 54.93 - 54.9 -       Morbid obesity, Weight - 324, BMI - 53.9. Refuses to weigh, no evident weight loss, lifestyle change a work in progress  Pain in joint, upper arm Improving but persists, has upcoming appt for further evaluation

## 2016-08-26 NOTE — Patient Instructions (Signed)
F/u in 5. 5 month, call if you need me sooner  Fasting chem 7 first week in December   For leg swelling, wear compression hose, elevate legs , and limit salt/sodium intake  Thank you  for choosing [facility]. We consider it a privelige to serve you.  Delivering excellent health care in a caring and  compassionate way is our goal.  Partnering with you,  so that together we can achieve this goal is our strategy.    Please work on good  health habits so that your health will improve. 1. Commitment to daily physical activity for 30 to 60  minutes, if you are able to do this.  2. Commitment to wise food choices. Aim for half of your  food intake to be vegetable and fruit, one quarter starchy foods, and one quarter protein. Try to eat on a regular schedule  3 meals per day, snacking between meals should be limited to vegetables or fruits or small portions of nuts. 64 ounces of water per day is generally recommended, unless you have specific health conditions, like heart failure or kidney failure where you will need to limit fluid intake.  3. Commitment to sufficient and a  good quality of physical and mental rest daily, generally between 6 to 8 hours per day.  WITH PERSISTANCE AND PERSEVERANCE, THE IMPOSSIBLE , BECOMES THE NORM!

## 2016-10-22 ENCOUNTER — Other Ambulatory Visit: Payer: Self-pay | Admitting: Family Medicine

## 2016-11-10 ENCOUNTER — Ambulatory Visit (INDEPENDENT_AMBULATORY_CARE_PROVIDER_SITE_OTHER): Payer: BC Managed Care – PPO | Admitting: Family Medicine

## 2016-11-10 ENCOUNTER — Encounter: Payer: Self-pay | Admitting: Family Medicine

## 2016-11-10 ENCOUNTER — Other Ambulatory Visit: Payer: Self-pay

## 2016-11-10 VITALS — BP 118/82 | HR 86 | Temp 97.4°F | Resp 16 | Ht 65.0 in

## 2016-11-10 DIAGNOSIS — R0789 Other chest pain: Secondary | ICD-10-CM | POA: Diagnosis not present

## 2016-11-10 DIAGNOSIS — J302 Other seasonal allergic rhinitis: Secondary | ICD-10-CM | POA: Diagnosis not present

## 2016-11-10 DIAGNOSIS — I10 Essential (primary) hypertension: Secondary | ICD-10-CM | POA: Diagnosis not present

## 2016-11-10 MED ORDER — MONTELUKAST SODIUM 10 MG PO TABS: 10.0000 mg | ORAL_TABLET | Freq: Every day | ORAL | 3 refills | Status: DC

## 2016-11-10 MED ORDER — BENZONATATE 100 MG PO CAPS: 100.0000 mg | ORAL_CAPSULE | Freq: Two times a day (BID) | ORAL | 0 refills | Status: DC | PRN

## 2016-11-10 NOTE — Progress Notes (Signed)
   Stefanie Taylor     MRN: [id number]      DOB: [birth year]   HPI Stefanie Taylor is here c/o chest pain , substernal since 10/31/2016, states she had chills x 1 day, never produced sputum, post nasal drainage was clear, no fever, , sore throat or ear pain.Feels chest discomfort is "congestion", c/o increased head stuffiness and post nasal drainage Notes more pronounced chest discomfort this week, daily pain, at least 4 episodes of discomfort per hr, duration less than 1 minute, no specific aggravating or relieving factors , non radiating, no associated nausea, light headedness or diaphoresis  ROS See HPI    Denies abdominal pain, nausea, vomiting,diarrhea or constipation.   Denies dysuria, frequency, hesitancy or incontinence. Denies joint pain, swelling and limitation in mobility. Denies headaches, seizures, numbness, or tingling. Denies depression, anxiety or insomnia. Denies skin break down or rash.   PE  BP 118/82 (BP Location: Right Arm, Patient Position: Sitting, Cuff Size: Large)   Pulse 86   Temp 97.4 F (36.3 C) (Temporal)   Resp 16   Ht 5\' 5"  (1.651 m)   SpO2 98%   Patient alert and oriented and in no cardiopulmonary distress. Increased clear nasal drainage and nasal congestion HEENT: No facial asymmetry, EOMI,   oropharynx pink and moist.  Neck supple  Chest: Clear to auscultation bilaterally.Substernal chest pain, no reproducible chest wall pain  CVS: S1, S2 no murmurs, no S3.Regular rate.  ABD: Soft non tender.   Ext: No edema  MS: Adequate ROM spine, shoulders, hips and knees.  Skin: Intact, no ulcerations or rash noted.  Psych: Good eye contact, normal affect. Memory intact not anxious or depressed appearing.  CNS: CN 2-12 intact, power,  normal throughout.no focal deficits noted.   Assessment & Plan  Allergic rhinitis Uncontrolled , start singulair and tessalon perles short termm as needed  Chest pain Atypical chest pain x 2 weeks , worse in  past 4 days. EKG : NSR, no ischemia, no LVH  HTN (hypertension) Controlled, no change in medication DASH diet and commitment to daily physical activity for a minimum of 30 minutes discussed and encouraged, as a part of hypertension management. The importance of attaining a healthy weight is also discussed.  BP/Weight 11/10/2016 08/26/2016 08/10/2016 07/14/2016 05/28/2016 03/30/2016 [id number]  Systolic BP 123456 123456 0000000 [id number] 123456 0000000  Diastolic BP 82 80 82 82 65 78 82  Wt. (Lbs) - - - - 320 - 320  BMI - - - - 54.93 - 54.9

## 2016-11-10 NOTE — Assessment & Plan Note (Signed)
Uncontrolled , start singulair and tessalon perles short termm as needed

## 2016-11-10 NOTE — Patient Instructions (Signed)
F/u in May as before, call if you need me sooner  You are being treated for uncontrolled allergies , take singulair one daily, and  Also a  decongestant is prescribed, tessalon perle , for use as needed  Office EKG today , if abnormal you will be referred to cardiolgist , I will let you know before you leave  Thank you  for choosing [facility]. We consider it a privelige to serve you.  Delivering excellent health care in a caring and  compassionate way is our goal.  Partnering with you,  so that together we can achieve this goal is our strategy.

## 2016-11-10 NOTE — Assessment & Plan Note (Signed)
Atypical chest pain x 2 weeks , worse in past 4 days. EKG : NSR, no ischemia, no LVH

## 2016-11-10 NOTE — Assessment & Plan Note (Signed)
Controlled, no change in medication DASH diet and commitment to daily physical activity for a minimum of 30 minutes discussed and encouraged, as a part of hypertension management. The importance of attaining a healthy weight is also discussed.  BP/Weight 11/10/2016 08/26/2016 08/10/2016 07/14/2016 05/28/2016 03/30/2016 [id number]  Systolic BP 123456 123456 0000000 [id number] 123456 0000000  Diastolic BP 82 80 82 82 65 78 82  Wt. (Lbs) - - - - 320 - 320  BMI - - - - 54.93 - 54.9

## 2017-01-18 ENCOUNTER — Other Ambulatory Visit: Payer: Self-pay | Admitting: Family Medicine

## 2017-01-18 DIAGNOSIS — I10 Essential (primary) hypertension: Secondary | ICD-10-CM

## 2017-02-03 ENCOUNTER — Ambulatory Visit (INDEPENDENT_AMBULATORY_CARE_PROVIDER_SITE_OTHER): Payer: BC Managed Care – PPO | Admitting: Orthopaedic Surgery

## 2017-02-03 ENCOUNTER — Encounter: Payer: Self-pay | Admitting: Orthopaedic Surgery

## 2017-02-03 VITALS — BP 118/65 | HR 70 | Temp 97.3°F | Ht 65.0 in | Wt 330.0 lb

## 2017-02-03 DIAGNOSIS — S76311A Strain of muscle, fascia and tendon of the posterior muscle group at thigh level, right thigh, initial encounter: Secondary | ICD-10-CM | POA: Diagnosis not present

## 2017-02-03 DIAGNOSIS — S86811A Strain of other muscle(s) and tendon(s) at lower leg level, right leg, initial encounter: Secondary | ICD-10-CM

## 2017-02-03 MED ORDER — NAPROXEN 500 MG PO TABS: 500.0000 mg | ORAL_TABLET | Freq: Two times a day (BID) | ORAL | 5 refills | Status: DC

## 2017-02-03 NOTE — Progress Notes (Signed)
Subjective:    Patient ID: Stefanie Taylor, female    DOB: [birth year], 61 y.o.   MRN: [id number]  HPI She goes to water aerobics four times a week regularly.  Last Friday, May 18th, she was in the pool doing her exercises.  As she did a push and pull, flex, extend of the knee she felt a slight twinge of pain in the posterior thigh and calf area.  She continued her exercises.  She felt a little odd after finishing and continued her regular activities.  On Saturday morning she felt pain in the calf area near the knee that was very painful for a few minutes.  She stayed at home and did not go out the rest of the day.  She went to church Sunday but had to sit in a different part of the church.  She could hardly walk.  She had marked pain on Sunday.  She has taken Advil which helped a little.  She changed to Aleve which has helped more.  She has used heat and ice.  She has a cane that helps her walk better.    Today she has less pain and is walking better.  She is concerned that the pain has lasted this long and wanted it checked out.  She has history of arthritis in both knees.  She has no other trauma, she has no edema and no numbness.   Review of Systems  HENT: Negative for congestion.   Respiratory: Negative for cough and shortness of breath.   Cardiovascular: Negative for chest pain and leg swelling.  Endocrine: Negative for cold intolerance.  Musculoskeletal: Positive for arthralgias and gait problem.  Allergic/Immunologic: Negative for environmental allergies.   Past Medical History:  Diagnosis Date  . H/O sickle cell trait   . Hypertension   . Morbid obesity with BMI of 50.0-59.9, adult ([place]) 08/15/12   BMI: 53.1 kg/m^2    Past Surgical History:  Procedure Laterality Date  . BIOPSY BREAST    . BREAST SURGERY  1998   biopsy mole on right breast  . BREATH TEK H PYLORI  09/04/2012   Procedure: BREATH TEK H PYLORI;  Surgeon: Shann Medal, MD;  Location: [facility];  Service:  General;  Laterality: N/A;  . BREATH TEK H PYLORI N/A 11/06/2012   Procedure: Lauris Chroman;  Surgeon: Shann Medal, MD;  Location: [facility];  Service: General;  Laterality: N/A;  . COLONOSCOPY WITH PROPOFOL N/A 05/28/2016   Procedure: COLONOSCOPY WITH PROPOFOL;  Surgeon: Carol Ada, MD;  Location: [facility];  Service: Endoscopy;  Laterality: N/A;  . TUBAL LIGATION    . UMBILICAL HERNIA REPAIR     age 16    Current Outpatient Prescriptions on File Prior to Visit  Medication Sig Dispense Refill  . benzonatate (TESSALON) 100 MG capsule Take 1 capsule (100 mg total) by mouth 2 (two) times daily as needed for cough. 20 capsule 0  . minoxidil (LONITEN) 2.5 MG tablet take 1 tablet by mouth once daily 90 tablet 1  . montelukast (SINGULAIR) 10 MG tablet Take 1 tablet (10 mg total) by mouth at bedtime. 30 tablet 3  . spironolactone (ALDACTONE) 25 MG tablet take 1 tablet by mouth twice a day 180 tablet 1   No current facility-administered medications on file prior to visit.     Social History   Social History  . Marital status: Married    Spouse name: N/A  . Number of children: N/A  .  Years of education: N/A   Occupational History  . Not on file.   Social History Main Topics  . Smoking status: Never Smoker  . Smokeless tobacco: Never Used  . Alcohol use Yes     Comment: wine- occ.  . Drug use: No  . Sexual activity: Yes    Birth control/ protection: Surgical   Other Topics Concern  . Not on file   Social History Narrative  . No narrative on file    Family History  Problem Relation Age of Onset  . Hypertension Mother   . Heart disease Mother   . Cancer Mother   . Hypertension Father   . Alcohol abuse Sister   . Hypertension Sister   . Diabetes Brother   . Hypertension Brother   . Diabetes Sister   . Hypertension Sister   . Diabetes Sister   . Hypertension Sister   . Diabetes Brother   . Hypertension Brother   . Diabetes Brother   . Hypertension  Brother   . Diabetes Brother   . Hypertension Brother   . Hypertension Brother     BP 118/65   Pulse 70   Temp 97.3 F (36.3 C)   Ht 5\' 5"  (1.651 m)   Wt (!) 330 lb (149.7 kg)   BMI 54.91 kg/m      Objective:   Physical Exam  Constitutional: She is oriented to person, place, and time. She appears well-developed and well-nourished.  HENT:  Head: Normocephalic and atraumatic.  Eyes: Conjunctivae and EOM are normal. Pupils are equal, round, and reactive to light.  Neck: Normal range of motion. Neck supple.  Cardiovascular: Normal rate, regular rhythm and intact distal pulses.   Pulmonary/Chest: Effort normal.  Abdominal: Soft.  Musculoskeletal: She exhibits tenderness (The area of the right posterior leg is tender, more of the proximal calf area, no defect is felt, no ecchymosis is present.  ROM knee 0 to 105 with crepitus.  Hamstrings tender laterally but no defect.  Limp to the right.  NV intact.  Left knee negative.).  Neurological: She is alert and oriented to person, place, and time. She displays normal reflexes. No cranial nerve deficit. She exhibits normal muscle tone. Coordination normal.  Skin: Skin is warm and dry.  Psychiatric: She has a normal mood and affect. Her behavior is normal. Judgment and thought content normal.  Vitals reviewed.         Assessment & Plan:   Encounter Diagnoses  Name Primary?  . Strain of calf muscle, right, initial encounter Yes  . Strain of right hamstring    I have told her I believe she has a strain.  How it occurred in the pool I do not know.  She denies any prior problem or a misstep prior to getting in the pool.  She is slowly improving.  I will begin Naprosyn 500 po bid pc.  Precautions discussed.  She should slowly resolve this within the next two weeks.  Call if any problem and I will see her as needed.  Electronically Signed Sanjuana Kava, MD 5/24/20189:08 AM

## 2017-02-09 ENCOUNTER — Other Ambulatory Visit: Payer: Self-pay | Admitting: Family Medicine

## 2017-02-09 ENCOUNTER — Ambulatory Visit: Payer: BC Managed Care – PPO | Admitting: Family Medicine

## 2017-02-09 ENCOUNTER — Telehealth: Payer: Self-pay

## 2017-02-09 DIAGNOSIS — I10 Essential (primary) hypertension: Secondary | ICD-10-CM

## 2017-02-09 LAB — BASIC METABOLIC PANEL WITH GFR
BUN: 19 mg/dL (ref 7–25)
CO2: 31 mmol/L (ref 20–31)
Calcium: 9.3 mg/dL (ref 8.6–10.4)
Chloride: 108 mmol/L (ref 98–110)
Creat: 0.89 mg/dL (ref 0.50–0.99)
GFR, EST AFRICAN AMERICAN: 80 mL/min (ref >=60)
GFR, EST NON AFRICAN AMERICAN: 70 mL/min (ref >=60)
GLUCOSE: 93 mg/dL (ref 65–99)
POTASSIUM: 5.6 mmol/L — AB (ref 3.5–5.3)
SODIUM: 142 mmol/L (ref 135–146)

## 2017-02-09 NOTE — Telephone Encounter (Signed)
Labs ordered.

## 2017-02-10 ENCOUNTER — Encounter: Payer: Self-pay | Admitting: Family Medicine

## 2017-02-10 ENCOUNTER — Ambulatory Visit (INDEPENDENT_AMBULATORY_CARE_PROVIDER_SITE_OTHER): Payer: BC Managed Care – PPO | Admitting: Family Medicine

## 2017-02-10 VITALS — BP 118/80 | HR 78 | Resp 16 | Ht 65.0 in | Wt 330.0 lb

## 2017-02-10 DIAGNOSIS — I10 Essential (primary) hypertension: Secondary | ICD-10-CM | POA: Diagnosis not present

## 2017-02-10 DIAGNOSIS — Z1231 Encounter for screening mammogram for malignant neoplasm of breast: Secondary | ICD-10-CM

## 2017-02-10 DIAGNOSIS — E875 Hyperkalemia: Secondary | ICD-10-CM

## 2017-02-10 DIAGNOSIS — J3089 Other allergic rhinitis: Secondary | ICD-10-CM | POA: Diagnosis not present

## 2017-02-10 DIAGNOSIS — Z1239 Encounter for other screening for malignant neoplasm of breast: Secondary | ICD-10-CM

## 2017-02-10 MED ORDER — SODIUM POLYSTYRENE SULFONATE 15 GM/60ML PO SUSP: 30.0000 g | Freq: Once | ORAL | 0 refills | Status: AC

## 2017-02-10 NOTE — Progress Notes (Signed)
   Stefanie Taylor     MRN: [id number]      DOB: 01-Feb-1956   HPI Stefanie Taylor is here for follow up and re-evaluation of chronic medical conditions, medication management and review of any available recent lab and radiology data.  Preventive health is updated, specifically  Cancer screening and Immunization.   Currently on naproxen for injured calf with some relief The PT denies any adverse reactions to current medications since the last visit.  Contemplating bariatric surgery once more as her  Weight continues to escalate, currently ambulating with a cane  ROS Denies recent fever or chills. Denies sinus pressure, nasal congestion, ear pain or sore throat. Denies chest congestion, productive cough or wheezing. Denies chest pains, palpitations and leg swelling Denies abdominal pain, nausea, vomiting,diarrhea or constipation.   Denies dysuria, frequency, hesitancy or incontinence. Denies headaches, seizures, numbness, or tingling. Denies depression, anxiety or insomnia. Denies skin break down or rash.   PE  BP 118/80   Pulse 78   Resp 16   Ht 5\' 5"  (1.651 m)   SpO2 97%   Patient alert and oriented and in no cardiopulmonary distress.  HEENT: No facial asymmetry, EOMI,   oropharynx pink and moist.  Neck supple no JVD, no mass.  Chest: Clear to auscultation bilaterally.  CVS: S1, S2 no murmurs, no S3.Regular rate.  ABD: Soft non tender.   Ext: No edema  MS: Adequate though reduced  ROM spine, shoulders, hips and knees. Currently ambulating with a cane  Skin: Intact, no ulcerations or rash noted.  Psych: Good eye contact, normal affect. Memory intact not anxious or depressed appearing.  CNS: CN 2-12 intact, power,  normal throughout.no focal deficits noted.   Assessment & Plan  HTN (hypertension) Controlled, no change in medication DASH diet and commitment to daily physical activity for a minimum of 30 minutes discussed and encouraged, as a part of hypertension  management. The importance of attaining a healthy weight is also discussed.  BP/Weight 02/10/2017 02/03/2017 11/10/2016 08/26/2016 08/10/2016 07/14/2016 8/83/2549  Systolic BP 826 415 830 940 768 088 110  Diastolic BP 80 65 82 80 82 82 65  Wt. (Lbs) 330 330 - - - - 320  BMI 54.91 54.91 - - - - 54.93       Morbid obesity, Weight - 324, BMI - 53.9. Deteriorated. Patient re-educated about  the importance of commitment to a  minimum of 150 minutes of exercise per week.  The importance of healthy food choices with portion control discussed. Encouraged to start a food diary, count calories and to consider  joining a support group. Sample diet sheets offered. Goals set by the patient for the next several months.   Weight /BMI 02/10/2017 02/03/2017 11/10/2016  WEIGHT 330 lb 330 lb -  HEIGHT 5\' 5"  5\' 5"  5\' 5"   BMI 54.91 kg/m2 54.91 kg/m2 -  reconsidering surgery    Allergic rhinitis No current flare and on no regular medicatio  Hyperkalemia Aggravated by current use of anti inflammatory for acute injury. One dose of kayexalate

## 2017-02-10 NOTE — Assessment & Plan Note (Signed)
Aggravated by current use of anti inflammatory for acute injury. One dose of kayexalate

## 2017-02-10 NOTE — Assessment & Plan Note (Signed)
Deteriorated. Patient re-educated about  the importance of commitment to a  minimum of 150 minutes of exercise per week.  The importance of healthy food choices with portion control discussed. Encouraged to start a food diary, count calories and to consider  joining a support group. Sample diet sheets offered. Goals set by the patient for the next several months.   Weight /BMI 02/10/2017 02/03/2017 11/10/2016  WEIGHT 330 lb 330 lb -  HEIGHT 5\' 5"  5\' 5"  5\' 5"   BMI 54.91 kg/m2 54.91 kg/m2 -  reconsidering surgery

## 2017-02-10 NOTE — Assessment & Plan Note (Signed)
Controlled, no change in medication DASH diet and commitment to daily physical activity for a minimum of 30 minutes discussed and encouraged, as a part of hypertension management. The importance of attaining a healthy weight is also discussed.  BP/Weight 02/10/2017 02/03/2017 11/10/2016 08/26/2016 08/10/2016 07/14/2016 0/94/0768  Systolic BP 088 110 315 945 859 292 446  Diastolic BP 80 65 82 80 82 82 65  Wt. (Lbs) 330 330 - - - - 320  BMI 54.91 54.91 - - - - 54.93

## 2017-02-10 NOTE — Assessment & Plan Note (Signed)
No current flare and on no regular medicatio

## 2017-02-10 NOTE — Patient Instructions (Addendum)
F/u in early January, call if you need me before   Excellent Blood pressure, continue current medication  One dose of medication sent for elevated potassium, take today please.  Please schedule your mammogram this is due   Please call us in November or  December to order  fasting labs which will be due for next visit. Please  get them  1 week before the next visit. These will  be CBC, fasting lipid, cmp and EGFR, HBA1C, TSH and vit D  I recommend that you reconsider bariatric surgery for weight management . Committing to set meal times between 6 in the morning and 7 in the evening, at least 3 times per day, with approximately 60 % of your intake being vegetable and fruit, and limiting liquid to water only , ensuring you drink 64 ounces daily will be a GREAT kick start!     Bariatric Surgery Information Bariatric surgery, also called weight loss surgery, is a procedure that helps you lose weight. You may consider or your health care provider may suggest bariatric surgery if:  You are severely obese and have been unable to lose weight through diet and exercise.  You have health problems related to obesity, such as: ? Type 2 diabetes. ? Heart disease. ? Lung disease.  How does bariatric surgery help me lose weight? Bariatric surgery helps you lose weight by decreasing how much food your body absorbs. This is done by closing off part of your stomach to make it smaller. This restricts the amount of food your stomach can hold. Bariatric surgery can also change your body's regular digestive process, so that food bypasses the parts of your body that absorb calories and nutrients. If you decide to have bariatric surgery, it is important to continue to eat a healthy diet and exercise regularly after the surgery. What are the different kinds of bariatric surgery? There are two kinds of bariatric surgeries:  Restrictive surgeries make your stomach smaller. They do not change your digestive  process. The smaller the size of your new stomach, the less food you can eat. There are different types of restrictive surgeries.  Malabsorptive surgeries both make your stomach smaller and alter your digestive process so that your body processes less calories and nutrients. These are the most common kind of bariatric surgery. There are different types of malabsorptive surgeries.  What are the different types of restrictive surgery? Adjustable Gastric Banding In this procedure, an inflatable band is placed around your stomach near the upper end. This makes the passageway for food into the rest of your stomach much smaller. The band can be adjusted, making it tighter or looser, by filling it with salt solution. Your surgeon can adjust the band based on how are you feeling and how much weight you are losing. The band can be removed in the future. Vertical Banded Gastroplasty In this procedure, staples are used to separate your stomach into two parts, a small upper pouch and a bigger lower pouch. This decreases how much food you can eat. Sleeve Gastrectomy In this procedure, your stomach is made smaller. This is done by surgically removing a large part of your stomach. When your stomach is smaller, you feel full more quickly and reduce how much you eat. What are the different types of malabsorptive surgery? Roux-en-Y Gastric Bypass (RGB) This is the most common weight loss surgery. In this procedure, a small stomach pouch is created in the upper part of your stomach. Next, this small stomach pouch is  attached directly to the middle part of your small intestine. The farther down your small intestine the new connection is made, the fewer calories and nutrients you will absorb. Biliopancreatic Diversion with Duodenal Switch (BPD/DS) This is a multi-step procedure. In this procedure, a large part of your stomach is removed, making your stomach smaller. Next, this smaller stomach is attached to the lower part  of your small intestine. Like the RGB surgery, you absorb fewer calories and nutrients the farther down your small intestine the attachment is made. What are the risks of bariatric surgery? As with any surgical procedure, each type of bariatric surgery has its own risks. These risks also depend on your age, your overall health, and any other medical conditions you may have. When deciding on bariatric surgery, it is very important to:  Talk to your health care provider and choose the surgery that is best for you.  Ask your health care provider about specific risks for the surgery you choose.  Where to find more information:  American Society for Metabolic & Bariatric Surgery: www.asmbs.org  Weight-control Information Network (WIN): win.AmenCredit.is This information is not intended to replace advice given to you by your health care provider. Make sure you discuss any questions you have with your health care provider. Document Released: 08/30/2005 Document Revised: 02/05/2016 Document Reviewed: 02/28/2013 Elsevier Interactive Patient Education  2017 [employer].

## 2017-02-15 ENCOUNTER — Other Ambulatory Visit: Payer: Self-pay | Admitting: Family Medicine

## 2017-02-15 DIAGNOSIS — Z1231 Encounter for screening mammogram for malignant neoplasm of breast: Secondary | ICD-10-CM

## 2017-02-16 ENCOUNTER — Ambulatory Visit: Payer: BC Managed Care – PPO

## 2017-03-11 ENCOUNTER — Ambulatory Visit
Admission: RE | Admit: 2017-03-11 | Discharge: 2017-03-11 | Disposition: A | Discharge status: Home / Self Care | Payer: BC Managed Care – PPO | Source: Ambulatory Visit | Attending: Family Medicine | Admitting: Family Medicine

## 2017-03-11 DIAGNOSIS — Z1231 Encounter for screening mammogram for malignant neoplasm of breast: Secondary | ICD-10-CM

## 2017-04-18 ENCOUNTER — Other Ambulatory Visit: Payer: Self-pay | Admitting: Family Medicine

## 2017-05-12 ENCOUNTER — Telehealth: Payer: Self-pay | Admitting: Family Medicine

## 2017-05-12 NOTE — Telephone Encounter (Signed)
I spoke directly with the patient she is set for bariatric surgery at [facility]  and needs cardiac clearance, will refer for soonest available

## 2017-05-12 NOTE — Telephone Encounter (Signed)
Patient would like referral to cardiology. Asks for Dr. Moshe Cipro to call her.   Callback# [phone]

## 2017-05-13 ENCOUNTER — Other Ambulatory Visit: Payer: Self-pay | Admitting: Family Medicine

## 2017-05-13 DIAGNOSIS — I10 Essential (primary) hypertension: Secondary | ICD-10-CM

## 2017-05-13 DIAGNOSIS — Z01818 Encounter for other preprocedural examination: Secondary | ICD-10-CM

## 2017-05-13 NOTE — Telephone Encounter (Signed)
Referral entered and flag is sent

## 2017-05-23 ENCOUNTER — Encounter: Payer: Self-pay | Admitting: Cardiology

## 2017-05-23 ENCOUNTER — Ambulatory Visit (INDEPENDENT_AMBULATORY_CARE_PROVIDER_SITE_OTHER): Payer: BC Managed Care – PPO | Admitting: Cardiology

## 2017-05-23 VITALS — BP 118/68 | HR 89 | Ht 65.0 in | Wt 323.4 lb

## 2017-05-23 DIAGNOSIS — E875 Hyperkalemia: Secondary | ICD-10-CM

## 2017-05-23 DIAGNOSIS — I10 Essential (primary) hypertension: Secondary | ICD-10-CM

## 2017-05-23 DIAGNOSIS — Z01810 Encounter for preprocedural cardiovascular examination: Secondary | ICD-10-CM | POA: Diagnosis not present

## 2017-05-23 NOTE — Progress Notes (Signed)
Cardiology Office Note:    Date:  05/23/2017   ID:  Stefanie Taylor, DOB 1955/11/16, MRN [id number]  PCP:  Fayrene Helper, MD  Cardiologist:  Jenean Lindau, MD   Referring MD: Fayrene Helper, MD    ASSESSMENT:    1. Essential hypertension   2. Hyperkalemia   3. Morbid obesity, Weight - 324, BMI - 53.9.   4. Preoperative cardiovascular examination    PLAN:    In order of problems listed above:  Preoperative risk assessment from a cardiovascular standpoint and his philosophy was discussed with the patient. Patient has multiple risk factors for coronary artery disease. She leads a sedentary lifestyle and is asymptomatic. We will schedule her for a Lexiscan sestamibi. This will give [place] an objective assessment of coronary artery disease. If this is negative, then she is not at high risk for coronary events during the aforementioned surgery. Meticulous hemodynamic monitoring will further reduce the risk of coronary events. I mentioned to her that this stress testing gives [place] an assessment of her risk and that a negative stress test is not a guarantee for a safe procedure. She verbalized understanding and questions were answered to her satisfaction. Echocardiogram will be done to assess murmur heard on auscultation.  Her blood pressure stable. She might need to use another medication for her blood pressure and we will consider making any additional changes in the future after she is stable from her surgery.  Diet was discussed for obesity. She will be seen in follow-up appointment in 3 months or earlier if she has any concerns.  Medication Adjustments/Labs and Tests Ordered: Current medicines are reviewed at length with the patient today.  Concerns regarding medicines are outlined above.  No orders of the defined types were placed in this encounter.  No orders of the defined types were placed in this encounter.    History of Present Illness:    Stefanie Taylor is a  61 y.o. female who is being seen today for the evaluation of preoperative cardiovascular assessment at the request of Fayrene Helper, MD. Patient is a pleasant 61 year old female. She has past medical history of essential hypertension. I noticed that her lab work also reveals hyperkalemia but she mentions to me that recent blood work done at [facility] has been unremarkable. She is morbidly obese and is contemplating bariatric surgery. She denies any chest pain orthopnea or PND. At the time of my evaluation she is alert awake oriented and in no distress. She obviously leads a sedentary lifestyle.  Past Medical History:  Diagnosis Date  . H/O sickle cell trait   . Hypertension   . Morbid obesity with BMI of 50.0-59.9, adult ([place]) 08/15/12   BMI: 53.1 kg/m^2    Past Surgical History:  Procedure Laterality Date  . BIOPSY BREAST    . BREAST BIOPSY    . BREAST EXCISIONAL BIOPSY Right   . BREAST SURGERY  1998   biopsy mole on right breast  . BREATH TEK H PYLORI  09/04/2012   Procedure: BREATH TEK H PYLORI;  Surgeon: Shann Medal, MD;  Location: [facility];  Service: General;  Laterality: N/A;  . BREATH TEK H PYLORI N/A 11/06/2012   Procedure: Lauris Chroman;  Surgeon: Shann Medal, MD;  Location: [facility];  Service: General;  Laterality: N/A;  . COLONOSCOPY WITH PROPOFOL N/A 05/28/2016   Procedure: COLONOSCOPY WITH PROPOFOL;  Surgeon: Carol Ada, MD;  Location: [facility];  Service: Endoscopy;  Laterality: N/A;  .  TUBAL LIGATION    . UMBILICAL HERNIA REPAIR     age 58    Current Medications: Current Meds  Medication Sig  . minoxidil (LONITEN) 2.5 MG tablet take 1 tablet by mouth once daily  . Multiple Vitamin (MULTIVITAMIN) tablet Take 1 tablet by mouth daily.  Marland Kitchen spironolactone (ALDACTONE) 25 MG tablet take 1 tablet by mouth twice a day     Allergies:   Sulfur; Prednisone; Lasix [furosemide]; Ibuprofen; and Penicillins   Social History   Social History  .  Marital status: Married    Spouse name: N/A  . Number of children: N/A  . Years of education: N/A   Social History Main Topics  . Smoking status: Never Smoker  . Smokeless tobacco: Never Used  . Alcohol use Yes     Comment: wine- occ.  . Drug use: No  . Sexual activity: Yes    Birth control/ protection: Surgical   Other Topics Concern  . None   Social History Narrative  . None     Family History: The patient's family history includes Alcohol abuse in her sister; Cancer in her mother; Diabetes in her brother, brother, brother, brother, sister, and sister; Heart disease in her mother; Hypertension in her brother, brother, brother, brother, brother, father, mother, sister, sister, and sister. There is no history of Breast cancer.  ROS:   Please see the history of present illness.    All other systems reviewed and are negative.  EKGs/Labs/Other Studies Reviewed:    The following studies were reviewed today: EKG done today reveals sinus rhythm and nonspecific ST-T changes. Patient's blood pressure stable and labs from previous   Recent Labs: 02/09/2017: BUN 19; Creat 0.89; Potassium 5.6; Sodium 142  Recent Lipid Panel    Component Value Date/Time   CHOL 160 03/30/2016 0713   TRIG 80 03/30/2016 0713   HDL 62 03/30/2016 0713   CHOLHDL 2.6 03/30/2016 0713   VLDL 16 03/30/2016 0713   LDLCALC 82 03/30/2016 0713    Physical Exam:    VS:  BP 118/68   Pulse 89   Ht 5\' 5"  (1.651 m)   Wt (!) 323 lb 6.4 oz (146.7 kg)   SpO2 98%   BMI 53.82 kg/m     Wt Readings from Last 3 Encounters:  05/23/17 (!) 323 lb 6.4 oz (146.7 kg)  02/10/17 (!) 330 lb (149.7 kg)  02/03/17 (!) 330 lb (149.7 kg)     GEN: Patient is in no acute distress HEENT: Normal NECK: No JVD; No carotid bruits LYMPHATICS: No lymphadenopathy CARDIAC: S1 S2 regular, 2/6 systolic murmur at the apex. RESPIRATORY:  Clear to auscultation without rales, wheezing or rhonchi  ABDOMEN: Soft, non-tender,  non-distended MUSCULOSKELETAL:  No edema; No deformity  SKIN: Warm and dry NEUROLOGIC:  Alert and oriented x 3 PSYCHIATRIC:  Normal affect    Signed, Jenean Lindau, MD  05/23/2017 8:59 AM    [place]

## 2017-05-23 NOTE — Patient Instructions (Signed)
Medication Instructions:   Your physician recommends that you continue on your current medications as directed. Please refer to the Current Medication list given to you today.   Labwork:  NONE  Testing/Procedures:  Your physician has requested that you have a lexiscan myoview. For further information please visit HugeFiesta.tn. Please follow instruction sheet, as given.  Your physician has requested that you have an echocardiogram. Echocardiography is a painless test that uses sound waves to create images of your heart. It provides your doctor with information about the size and shape of your heart and how well your heart's chambers and valves are working. This procedure takes approximately one hour. There are no restrictions for this procedure.  These test will be done at out main office at [place], [place]   Follow-Up:  Your physician recommends that you schedule a follow-up appointment in: 3 months with Dr. Geraldo Pitter.   Any Other Special Instructions Will Be Listed Below (If Applicable).     If you need a refill on your cardiac medications before your next appointment, please call your pharmacy.

## 2017-05-30 ENCOUNTER — Telehealth (HOSPITAL_COMMUNITY): Payer: Self-pay | Admitting: *Deleted

## 2017-05-30 NOTE — Telephone Encounter (Signed)
Left message on voicemail in reference to upcoming appointment scheduled for 06/01/17. Phone number given for a call back so details instructions can be given. Lulabelle Desta, [place]

## 2017-06-01 ENCOUNTER — Ambulatory Visit (HOSPITAL_COMMUNITY): Payer: BC Managed Care – PPO | Attending: Cardiovascular Disease

## 2017-06-01 DIAGNOSIS — Z6841 Body Mass Index (BMI) 40.0 and over, adult: Secondary | ICD-10-CM | POA: Insufficient documentation

## 2017-06-01 DIAGNOSIS — Z01810 Encounter for preprocedural cardiovascular examination: Secondary | ICD-10-CM | POA: Diagnosis not present

## 2017-06-01 DIAGNOSIS — E875 Hyperkalemia: Secondary | ICD-10-CM | POA: Insufficient documentation

## 2017-06-01 DIAGNOSIS — I10 Essential (primary) hypertension: Secondary | ICD-10-CM

## 2017-06-01 MED ORDER — TECHNETIUM TC 99M TETROFOSMIN IV KIT
31.7000 | PACK | Freq: Once | INTRAVENOUS | Status: AC | PRN
  Administered 2017-06-01: 31.7 via INTRAVENOUS
  Filled 2017-06-01: qty 32

## 2017-06-01 MED ORDER — REGADENOSON 0.4 MG/5ML IV SOLN
0.4000 mg | Freq: Once | INTRAVENOUS | Status: AC
  Administered 2017-06-01: 0.4 mg via INTRAVENOUS

## 2017-06-02 ENCOUNTER — Ambulatory Visit (HOSPITAL_BASED_OUTPATIENT_CLINIC_OR_DEPARTMENT_OTHER): Payer: BC Managed Care – PPO

## 2017-06-02 ENCOUNTER — Other Ambulatory Visit: Payer: Self-pay

## 2017-06-02 ENCOUNTER — Ambulatory Visit (HOSPITAL_COMMUNITY): Payer: BC Managed Care – PPO | Attending: Cardiology

## 2017-06-02 DIAGNOSIS — Z01810 Encounter for preprocedural cardiovascular examination: Secondary | ICD-10-CM | POA: Insufficient documentation

## 2017-06-02 DIAGNOSIS — I10 Essential (primary) hypertension: Secondary | ICD-10-CM | POA: Insufficient documentation

## 2017-06-02 DIAGNOSIS — E875 Hyperkalemia: Secondary | ICD-10-CM | POA: Insufficient documentation

## 2017-06-02 DIAGNOSIS — Z6841 Body Mass Index (BMI) 40.0 and over, adult: Secondary | ICD-10-CM | POA: Insufficient documentation

## 2017-06-02 LAB — MYOCARDIAL PERFUSION IMAGING
CHL CUP RESTING HR STRESS: 67 {beats}/min
LHR: 0.27
LV sys vol: 36 mL
LVDIAVOL: 107 mL (ref 46–106)
Peak HR: 88 {beats}/min
SDS: 1
SRS: 0
SSS: 1
TID: 0.88

## 2017-06-02 MED ORDER — TECHNETIUM TC 99M TETROFOSMIN IV KIT
32.8000 | PACK | Freq: Once | INTRAVENOUS | Status: AC | PRN
  Administered 2017-06-02: 32.8 via INTRAVENOUS
  Filled 2017-06-02: qty 33

## 2017-07-06 ENCOUNTER — Telehealth: Payer: Self-pay | Admitting: Cardiology

## 2017-07-06 NOTE — Telephone Encounter (Signed)
Notes and results were sent 9/21, this was confirmed that fax was successful with confirmation, I also called over to speak with [facility] and was informed that they did have the notes and just needed something saying that she was cleared. I explained that in the note Dr. Geraldo Pitter states if all testing comes back negative then she is not at high risk for surgery. Also it states in the results notes that her test came back unremarkable. This was enough for them to proceed with the surgery.

## 2017-07-06 NOTE — Telephone Encounter (Signed)
Patient called and states that we have not sent her Clearance to [facility] to get her Bariatric Surgery done and they will not scheduled til we do. I have printed her records but they need a note stating that she is cleared.. Please help take care of this for patient.

## 2017-07-26 ENCOUNTER — Telehealth: Payer: Self-pay | Admitting: *Deleted

## 2017-07-26 ENCOUNTER — Other Ambulatory Visit: Payer: Self-pay

## 2017-07-26 DIAGNOSIS — I10 Essential (primary) hypertension: Secondary | ICD-10-CM

## 2017-07-26 MED ORDER — SPIRONOLACTONE 25 MG PO TABS: 25.0000 mg | ORAL_TABLET | Freq: Two times a day (BID) | ORAL | 1 refills | Status: DC

## 2017-07-26 NOTE — Telephone Encounter (Signed)
Patient left message stating she currently takes 2 blood pressure pills for high blood pressure and her pharmacy only filled the minodidi 2.5mg  tablet on 07/19/17 and did not fill the other. Patient states the pharmacy told her they sent a request regarding the other on 07/19/17.Patient has an upcoming appointment with Dr Moshe Cipro on 08/17/17. Please advise [phone] once filled.

## 2017-07-27 ENCOUNTER — Other Ambulatory Visit: Payer: Self-pay

## 2017-07-27 DIAGNOSIS — I10 Essential (primary) hypertension: Secondary | ICD-10-CM

## 2017-07-27 MED ORDER — MINOXIDIL 2.5 MG PO TABS: 2.5000 mg | ORAL_TABLET | Freq: Every day | ORAL | 1 refills | Status: DC

## 2017-07-27 MED ORDER — SPIRONOLACTONE 25 MG PO TABS: 25.0000 mg | ORAL_TABLET | Freq: Two times a day (BID) | ORAL | 1 refills | Status: DC

## 2017-07-27 NOTE — Telephone Encounter (Signed)
Patient aware.

## 2017-07-27 NOTE — Telephone Encounter (Signed)
She had refills left on both. Both was resent with 6 months of refills again today

## 2017-08-22 ENCOUNTER — Ambulatory Visit: Payer: BC Managed Care – PPO | Admitting: Cardiology

## 2017-08-29 ENCOUNTER — Ambulatory Visit (INDEPENDENT_AMBULATORY_CARE_PROVIDER_SITE_OTHER): Payer: BC Managed Care – PPO | Admitting: Cardiology

## 2017-08-29 ENCOUNTER — Encounter: Payer: Self-pay | Admitting: Cardiology

## 2017-08-29 VITALS — BP 116/78 | HR 81 | Wt 305.1 lb

## 2017-08-29 DIAGNOSIS — Z01810 Encounter for preprocedural cardiovascular examination: Secondary | ICD-10-CM | POA: Diagnosis not present

## 2017-08-29 DIAGNOSIS — E875 Hyperkalemia: Secondary | ICD-10-CM | POA: Diagnosis not present

## 2017-08-29 DIAGNOSIS — I10 Essential (primary) hypertension: Secondary | ICD-10-CM

## 2017-08-29 NOTE — Progress Notes (Signed)
Cardiology Office Note:    Date:  08/29/2017   ID:  Stefanie Taylor, DOB Feb 04, 1956, MRN [id number]  PCP:  Fayrene Helper, MD  Cardiologist:  Jenean Lindau, MD   Referring MD: Fayrene Helper, MD    ASSESSMENT:    1. Preoperative cardiovascular examination   2. Essential hypertension   3. Hyperkalemia    PLAN:    In order of problems listed above:  1. Risk stratification issues and philosophy was discussed with her.  In view of the above testing she is not  at high risk for cardiovascular events during the aforementioned surgery.  Meticulous hemodynamic monitoring will further reduce the risk of coronary events.  I mentioned to the patient that his stress test and echocardiogram was largely unremarkable.  In my opinion this is the best she will be optimized for preop assessment.  She has tried significantly to lose weight and has been successful at it.  Please do not hesitate to call me if there are any questions in the cardiovascular management.  She will be seen in follow-up appointment in my clinic in 4 months.  Her blood pressure stable.   Medication Adjustments/Labs and Tests Ordered: Current medicines are reviewed at length with the patient today.  Concerns regarding medicines are outlined above.  No orders of the defined types were placed in this encounter.  No orders of the defined types were placed in this encounter.    Chief Complaint  Patient presents with  . Follow-up    Cardiac clearance for bariatric surgery echo & lexi     History of Present Illness:    Stefanie Taylor is a 61 y.o. female.  The patient has history of essential hypertension, morbid obesity.  She was evaluated by me for preop assessment for bariatric surgery.  She leads a sedentary lifestyle.  She does not exercise on a regular basis but she is an active lady.  She denies any chest pain orthopnea or PND.  For the aforementioned reason she underwent echocardiogram and stress  testing.  Past Medical History:  Diagnosis Date  . H/O sickle cell trait   . Hypertension   . Morbid obesity with BMI of 50.0-59.9, adult ([place]) 08/15/12   BMI: 53.1 kg/m^2    Past Surgical History:  Procedure Laterality Date  . BIOPSY BREAST    . BREAST BIOPSY    . BREAST EXCISIONAL BIOPSY Right   . BREAST SURGERY  1998   biopsy mole on right breast  . BREATH TEK H PYLORI  09/04/2012   Procedure: BREATH TEK H PYLORI;  Surgeon: Shann Medal, MD;  Location: [facility];  Service: General;  Laterality: N/A;  . BREATH TEK H PYLORI N/A 11/06/2012   Procedure: Lauris Chroman;  Surgeon: Shann Medal, MD;  Location: [facility];  Service: General;  Laterality: N/A;  . COLONOSCOPY WITH PROPOFOL N/A 05/28/2016   Procedure: COLONOSCOPY WITH PROPOFOL;  Surgeon: Carol Ada, MD;  Location: [facility];  Service: Endoscopy;  Laterality: N/A;  . TUBAL LIGATION    . UMBILICAL HERNIA REPAIR     age 22    Current Medications: Current Meds  Medication Sig  . Acetaminophen (MAPAP) 500 MG coapsule Take by mouth.  . enoxaparin (LOVENOX) 40 MG/0.4ML injection Inject into the skin.  Marland Kitchen gabapentin (NEURONTIN) 300 MG capsule Take by mouth.  . minoxidil (LONITEN) 2.5 MG tablet Take 1 tablet (2.5 mg total) daily by mouth.  . Multiple Vitamin (MULTIVITAMIN) tablet Take 1 tablet  by mouth daily.  . ondansetron (ZOFRAN) 4 MG tablet Take by mouth.  . pantoprazole (PROTONIX) 40 MG tablet Take by mouth.  . polyethylene glycol (MIRALAX / GLYCOLAX) packet Take by mouth.  Marland Kitchen scopolamine (TRANSDERM-SCOP) 1 MG/3DAYS Place onto the skin.  Marland Kitchen spironolactone (ALDACTONE) 25 MG tablet Take 1 tablet (25 mg total) 2 (two) times daily by mouth.     Allergies:   Sulfur; Prednisone; Lasix [furosemide]; Sulfamethoxazole; Ibuprofen; and Penicillins   Social History   Socioeconomic History  . Marital status: Married    Spouse name: None  . Number of children: None  . Years of education: None  . Highest  education level: None  Social Needs  . Financial resource strain: None  . Food insecurity - worry: None  . Food insecurity - inability: None  . Transportation needs - medical: None  . Transportation needs - non-medical: None  Occupational History  . None  Tobacco Use  . Smoking status: Never Smoker  . Smokeless tobacco: Never Used  Substance and Sexual Activity  . Alcohol use: No    Comment: wine- occ.  . Drug use: No  . Sexual activity: Yes    Birth control/protection: Surgical  Other Topics Concern  . None  Social History Narrative  . None     Family History: The patient's family history includes Alcohol abuse in her sister; Cancer in her mother; Diabetes in her brother, brother, brother, brother, sister, and sister; Heart disease in her mother; Hypertension in her brother, brother, brother, brother, brother, father, mother, sister, sister, and sister. There is no history of Breast cancer.  ROS:   Please see the history of present illness.    All other systems reviewed and are negative.  EKGs/Labs/Other Studies Reviewed:    The following studies were reviewed today: I reviewed the echocardiogram and stress test report with her at extensive length.   Recent Labs: 02/09/2017: BUN 19; Creat 0.89; Potassium 5.6; Sodium 142  Recent Lipid Panel    Component Value Date/Time   CHOL 160 03/30/2016 0713   TRIG 80 03/30/2016 0713   HDL 62 03/30/2016 0713   CHOLHDL 2.6 03/30/2016 0713   VLDL 16 03/30/2016 0713   LDLCALC 82 03/30/2016 0713    Physical Exam:    VS:  BP 116/78 (BP Location: Left Arm, Patient Position: Sitting)   Pulse 81   Wt (!) 305 lb 1.9 oz (138.4 kg)   SpO2 97%   BMI 50.77 kg/m     Wt Readings from Last 3 Encounters:  08/29/17 (!) 305 lb 1.9 oz (138.4 kg)  06/01/17 (!) 323 lb (146.5 kg)  05/23/17 (!) 323 lb 6.4 oz (146.7 kg)     GEN: Patient is in no acute distress HEENT: Normal NECK: No JVD; No carotid bruits LYMPHATICS: No  lymphadenopathy CARDIAC: Hear sounds regular, 2/6 systolic murmur at the apex. RESPIRATORY:  Clear to auscultation without rales, wheezing or rhonchi  ABDOMEN: Soft, non-tender, non-distended MUSCULOSKELETAL:  No edema; No deformity  SKIN: Warm and dry NEUROLOGIC:  Alert and oriented x 3 PSYCHIATRIC:  Normal affect   Signed, Jenean Lindau, MD  08/29/2017 9:52 AM    [place]

## 2017-08-29 NOTE — Patient Instructions (Signed)
Medication Instructions:  Your physician recommends that you continue on your current medications as directed. Please refer to the Current Medication list given to you today.  Labwork: None  Testing/Procedures: None  Follow-Up: Your physician recommends that you schedule a follow-up appointment in: 4 months  Any Other Special Instructions Will Be Listed Below (If Applicable).     If you need a refill on your cardiac medications before your next appointment, please call your pharmacy.   [facility]  Raechal Raben A, RN, BSN  

## 2017-08-31 ENCOUNTER — Telehealth: Payer: Self-pay | Admitting: Family Medicine

## 2017-08-31 NOTE — Telephone Encounter (Signed)
Patient called in requesting medication for a sinus infection, she says she has no coughing or fever just pain in the bridge of her nose. She has used flonase and hot compress.  She is having surgery 09/08/17 so she does not want to come in the office in fear of getting sick. Pharmacy is [facility] on [address]#: [phone]

## 2017-08-31 NOTE — Telephone Encounter (Signed)
Without a h/o fever, chills, or excess sinus drainage , I sdtill have not heard a reason to prescribe an antibiotic,  Will do more harm than good if prescribed with no indication, please explain

## 2017-08-31 NOTE — Telephone Encounter (Signed)
Can you please make her aware of this?

## 2017-08-31 NOTE — Telephone Encounter (Signed)
Pt called in left a message, I returned her call left a msg for her to return our call

## 2017-09-07 HISTORY — PX: GASTRIC BYPASS: SHX52

## 2017-09-10 MED ORDER — ACETAMINOPHEN 325 MG PO TABS
650.00 | ORAL_TABLET | ORAL | Status: DC
Start: 2017-09-10 — End: 2017-09-10

## 2017-09-10 MED ORDER — ONDANSETRON 4 MG PO TBDP
4.00 | ORAL_TABLET | ORAL | Status: DC
Start: 2017-09-10 — End: 2017-09-10

## 2017-09-10 MED ORDER — PANTOPRAZOLE SODIUM 40 MG PO TBEC
40.00 | DELAYED_RELEASE_TABLET | ORAL | Status: DC
Start: 2017-09-11 — End: 2017-09-10

## 2017-09-10 MED ORDER — DIPHENHYDRAMINE HCL 50 MG/ML IJ SOLN
12.50 | INTRAMUSCULAR | Status: DC
Start: ? — End: 2017-09-10

## 2017-09-10 MED ORDER — ENOXAPARIN SODIUM 40 MG/0.4ML ~~LOC~~ SOLN
40.00 | SUBCUTANEOUS | Status: DC
Start: 2017-09-11 — End: 2017-09-10

## 2017-09-10 MED ORDER — OXYCODONE HCL 5 MG PO TABS
5.00 | ORAL_TABLET | ORAL | Status: DC
Start: ? — End: 2017-09-10

## 2017-09-10 MED ORDER — POTASSIUM CHLORIDE CRYS ER 20 MEQ PO TBCR
40.00 | EXTENDED_RELEASE_TABLET | ORAL | Status: DC
Start: 2017-09-11 — End: 2017-09-10

## 2017-09-10 MED ORDER — PROMETHAZINE HCL 25 MG/ML IJ SOLN
6.25 | INTRAMUSCULAR | Status: DC
Start: ? — End: 2017-09-10

## 2017-09-10 MED ORDER — GABAPENTIN 300 MG/6ML PO SOLN
300.00 | ORAL | Status: DC
Start: 2017-09-10 — End: 2017-09-10

## 2017-09-10 MED ORDER — SCOPOLAMINE 1 MG/3DAYS TD PT72
1.00 | MEDICATED_PATCH | TRANSDERMAL | Status: DC
Start: 2017-09-11 — End: 2017-09-10

## 2017-09-14 ENCOUNTER — Telehealth: Payer: Self-pay | Admitting: Family Medicine

## 2017-09-14 NOTE — Telephone Encounter (Signed)
Called patient for TOC. Called number in chart, was advised that she is in court today and can return call. Per Velna Hatchet, patient can either take 10:00 am or 2:00 pm slot on January 8 for hospital follow-up with Dr. Moshe Cipro

## 2017-09-14 NOTE — Telephone Encounter (Signed)
-----   Message from Merceda Elks, LPN sent at 03/22/1503  7:54 AM EST ----- Regarding: FW: pls give transitional call I can make call this afternoon, please fit her in an appt.  ----- Message ----- From: Fayrene Helper, MD Sent: 09/13/2017   9:52 PM To: Fayrene Helper, MD, Merceda Elks, LPN Subject: pls give transitional call                     Discharged from [facility] 12/29 had bariatric surgery, needs f/u appt also

## 2017-09-19 ENCOUNTER — Encounter: Payer: Self-pay | Admitting: Family Medicine

## 2017-09-19 ENCOUNTER — Ambulatory Visit: Payer: BC Managed Care – PPO | Admitting: Family Medicine

## 2017-09-19 VITALS — BP 108/70 | HR 76 | Resp 16 | Ht 65.0 in | Wt 298.8 lb

## 2017-09-19 DIAGNOSIS — Z09 Encounter for follow-up examination after completed treatment for conditions other than malignant neoplasm: Secondary | ICD-10-CM

## 2017-09-19 DIAGNOSIS — I10 Essential (primary) hypertension: Secondary | ICD-10-CM

## 2017-09-19 DIAGNOSIS — M150 Primary generalized (osteo)arthritis: Secondary | ICD-10-CM | POA: Diagnosis not present

## 2017-09-19 DIAGNOSIS — M159 Polyosteoarthritis, unspecified: Secondary | ICD-10-CM

## 2017-09-19 NOTE — Patient Instructions (Signed)
F/u in 4 .5 month, call if you need me before  Nurse BP check in 8 weeks, you will likely need to stop minoxidil with an additional 20 pound weight loss, please if you start feeling light headed wirh position change call for BP check       It is important that you exercise regularly at least 45 to 60  minutes 5 times a week. If you develop chest pain, have severe difficulty breathing, or feel very tired, stop exercising immediately and seek medical attention    Weight loss goal of 40 to 50 pounds   CONGRATS and all the best!!  Labs needed for next visit eill be mailed

## 2017-10-02 ENCOUNTER — Telehealth: Payer: Self-pay | Admitting: Family Medicine

## 2017-10-02 DIAGNOSIS — E875 Hyperkalemia: Secondary | ICD-10-CM

## 2017-10-02 DIAGNOSIS — I10 Essential (primary) hypertension: Secondary | ICD-10-CM

## 2017-10-02 DIAGNOSIS — Z09 Encounter for follow-up examination after completed treatment for conditions other than malignant neoplasm: Secondary | ICD-10-CM | POA: Insufficient documentation

## 2017-10-02 DIAGNOSIS — E559 Vitamin D deficiency, unspecified: Secondary | ICD-10-CM

## 2017-10-02 NOTE — Assessment & Plan Note (Signed)
Adequately controlled on current regime, I anticipate the need to discontinue her current medication if she continues to lose weight at currnet rate and have scheduled a nurse BP check in 8 weeks

## 2017-10-02 NOTE — Progress Notes (Signed)
   Stefanie Taylor     MRN: [id number]      DOB: 1956-04-23   HPI Stefanie Taylor is here for follow up and re-evaluation following bariatric surgeriy on 09/08/2017 and was d/c on 09/10/2017, denies any significant complications while in hospital or since discharge, still getting accustomed to reduced oral intake . Has not had to use pain medication, denies nausea or vomiting.Bowel movements are regular Has had no fever or chills, denies problems with wound healing Feels light headed occasionally despite the fact that one of her antihypertensives was discontinued prior to her d/c  Home States she is disappointed in her weight loss since surgery, however , I point out that she is losing at an acceptable rate, and encourage her to seek and make use of psychological counseling offered as she pursues her weight loss journey  ROS Denies recent fever or chills. Denies sinus pressure, nasal congestion, ear pain or sore throat. Denies chest congestion, productive cough or wheezing. Denies chest pains, palpitations and leg swelling Denies abdominal pain, nausea, vomiting,diarrhea or constipation.   Denies dysuria, frequency, hesitancy or incontinence. Reports reduction in  joint pain, swelling and limitation in mobility.She has started more regular exercise Denies headaches, seizures, numbness, or tingling. Denies depression, anxiety or insomnia.  PE  BP 108/70   Pulse 76   Resp 16   Ht 5\' 5"  (1.651 m)   Wt 298 lb 12.8 oz (135.5 kg)   SpO2 97%   BMI 49.72 kg/m   Patient alert and oriented and in no cardiopulmonary distress.  HEENT: No facial asymmetry, EOMI,   oropharynx pink and moist.  Neck supple no JVD, no mass.  Chest: Clear to auscultation bilaterally.  CVS: S1, S2 no murmurs, no S3.Regular rate.  ABD: Soft non tender.   Ext: No edema  MS: Adequate though reduced  ROM spine, shoulders, hips and knees.  Skin: Intact, no ulcerations or rash noted.  Psych: Good eye contact,  normal affect. Memory intact not anxious or depressed appearing.  CNS: CN 2-12 intact, power,  normal throughout.no focal deficits noted.   Assessment & Plan  HTN (hypertension) Adequately controlled on current regime, I anticipate the need to discontinue her current medication if she continues to lose weight at currnet rate and have scheduled a nurse BP check in 8 weeks  Morbid obesity, Weight - 324, BMI - 53.9. Improved since last visit. Reports and it is documented that she actually lost approximately 20 lb pre op, discontinuing wine and increasing exercise. I expect her to continue to do very well with this surgery that she has spent some time contemplating F/u in 4 months, expect approx 30 to 40 lb weight loss by then   Osteoarthritis Reports less pain, and debility with her current weight loss and has started more exercise commitment  Hospital discharge follow-up Hospitalize at [facility] between 12/27 to 09/10/2017 when she had bariatric surgery and here for follow up. Hospital course and post op medications reviewed in office. Blood pressure management in particular is discussed, and I explained to [name] that I am in total agreement with her current regime which involves only 1 and not 2 medications as before, this because of her weight loss, and exercise commitment as well as dietary change, she is requiring less medication. All questions were answered. She is doing well and all medical concerns were addressed , she is highly motivated to make her surgery successful

## 2017-10-02 NOTE — Assessment & Plan Note (Signed)
Improved since last visit. Reports and it is documented that she actually lost approximately 20 lb pre op, discontinuing wine and increasing exercise. I expect her to continue to do very well with this surgery that she has spent some time contemplating F/u in 4 months, expect approx 30 to 40 lb weight loss by then

## 2017-10-02 NOTE — Assessment & Plan Note (Signed)
Hospitalize at [facility] between 12/27 to 09/10/2017 when she had bariatric surgery and here for follow up. Hospital course and post op medications reviewed in office. Blood pressure management in particular is discussed, and I explained to Stefanie Taylor that I am in total agreement with her current regime which involves only 1 and not 2 medications as before, this because of her weight loss, and exercise commitment as well as dietary change, she is requiring less medication. All questions were answered. She is doing well and all medical concerns were addressed , she is highly motivated to make her surgery successful

## 2017-10-02 NOTE — Assessment & Plan Note (Signed)
Reports less pain, and debility with her current weight loss and has started more exercise commitment

## 2017-10-02 NOTE — Telephone Encounter (Signed)
Pls mail lab order for her to get end May for her to get 1 week before her June f/u fasting CBC, lipid, cmp and EGFr, TSH and vit D, thanks! 

## 2017-10-03 NOTE — Addendum Note (Signed)
Addended by: Eual Fines on: 10/03/2017 11:52 AM   Modules accepted: Orders

## 2017-11-15 ENCOUNTER — Other Ambulatory Visit: Payer: Self-pay

## 2017-11-15 ENCOUNTER — Ambulatory Visit: Payer: BC Managed Care – PPO

## 2017-11-15 VITALS — BP 138/82

## 2017-11-15 DIAGNOSIS — I10 Essential (primary) hypertension: Secondary | ICD-10-CM

## 2017-11-15 MED ORDER — TRIAMCINOLONE ACETONIDE 0.1 % EX CREA: TOPICAL_CREAM | Freq: Two times a day (BID) | CUTANEOUS | 1 refills | Status: DC

## 2017-11-17 ENCOUNTER — Ambulatory Visit: Payer: BC Managed Care – PPO

## 2018-01-06 ENCOUNTER — Encounter: Payer: Self-pay | Admitting: Cardiology

## 2018-01-06 ENCOUNTER — Ambulatory Visit: Payer: BC Managed Care – PPO | Admitting: Cardiology

## 2018-01-06 VITALS — BP 124/72 | HR 59 | Ht 65.0 in | Wt 266.0 lb

## 2018-01-06 DIAGNOSIS — Z01810 Encounter for preprocedural cardiovascular examination: Secondary | ICD-10-CM | POA: Diagnosis not present

## 2018-01-06 DIAGNOSIS — E663 Overweight: Secondary | ICD-10-CM | POA: Diagnosis not present

## 2018-01-06 NOTE — Patient Instructions (Signed)
Medication Instructions:  Your physician recommends that you continue on your current medications as directed. Please refer to the Current Medication list given to you today.   Labwork: None  Testing/Procedures: None  Follow-Up: Your physician recommends that you schedule a follow-up appointment in: as needed   Any Other Special Instructions Will Be Listed Below (If Applicable).     If you need a refill on your cardiac medications before your next appointment, please call your pharmacy.   [facility]  Ashley A, RN, BSN  

## 2018-01-06 NOTE — Progress Notes (Signed)
Cardiology Office Note:    Date:  01/06/2018   ID:  Stefanie Taylor, DOB 17-Jun-1956, MRN [id number]  PCP:  Fayrene Helper, MD  Cardiologist:  Jenean Lindau, MD   Referring MD: Fayrene Helper, MD    ASSESSMENT:    1. Preoperative cardiovascular examination   2. Overweight    PLAN:    In order of problems listed above:  I discussed the findings of the stress test with the patient at length.  Lipids were also reviewed from whatever labs were available from the past.  I congratulated her on excellent work with weight loss.  She is going to work harder to lose weight.  Blood pressure stable at this point I do not see any reason for antihypertensive medications.  Lifestyle modification was discussed and she will be seen in follow-up appointment on a as needed basis.   Medication Adjustments/Labs and Tests Ordered: Current medicines are reviewed at length with the patient today.  Concerns regarding medicines are outlined above.  No orders of the defined types were placed in this encounter.  No orders of the defined types were placed in this encounter.    Chief Complaint  Patient presents with  . Follow-up     History of Present Illness:    Stefanie Taylor is a 62 y.o. female.  The patient was evaluated by me for preop assessment.  Stress test was unremarkable and subsequently she did fine.  She leads a sedentary lifestyle because of orthopedic issues affecting her knee but she is trying to diet well to lose weight and is been very successful.  She mentions to me that around this time she has lost about 60 pounds and plans to lose much.  No chest pain orthopnea or PND.  At the time of my evaluation, the patient is alert awake oriented and in no distress.  Past Medical History:  Diagnosis Date  . H/O sickle cell trait   . Hypertension   . Morbid obesity with BMI of 50.0-59.9, adult ([place]) 08/15/12   BMI: 53.1 kg/m^2    Past Surgical History:  Procedure  Laterality Date  . BIOPSY BREAST    . BREAST BIOPSY    . BREAST EXCISIONAL BIOPSY Right   . BREAST SURGERY  1998   biopsy mole on right breast  . BREATH TEK H PYLORI  09/04/2012   Procedure: BREATH TEK H PYLORI;  Surgeon: Shann Medal, MD;  Location: [facility];  Service: General;  Laterality: N/A;  . BREATH TEK H PYLORI N/A 11/06/2012   Procedure: Lauris Chroman;  Surgeon: Shann Medal, MD;  Location: [facility];  Service: General;  Laterality: N/A;  . COLONOSCOPY WITH PROPOFOL N/A 05/28/2016   Procedure: COLONOSCOPY WITH PROPOFOL;  Surgeon: Carol Ada, MD;  Location: [facility];  Service: Endoscopy;  Laterality: N/A;  . GASTRIC BYPASS  09/07/2017  . TUBAL LIGATION    . UMBILICAL HERNIA REPAIR     age 36    Current Medications: Current Meds  Medication Sig  . minoxidil (LONITEN) 2.5 MG tablet Take 1 tablet (2.5 mg total) daily by mouth.  . Multiple Vitamin (MULTIVITAMIN) tablet Take 1 tablet by mouth daily.  . vitamin B-12 (CYANOCOBALAMIN) 1000 MCG tablet Take 1 tablet by mouth daily.     Allergies:   Sulfur; Prednisone; Lasix [furosemide]; Sulfamethoxazole; Ibuprofen; and Penicillins   Social History   Socioeconomic History  . Marital status: Married    Spouse name: Not on file  .  Number of children: Not on file  . Years of education: Not on file  . Highest education level: Not on file  Occupational History  . Not on file  Social Needs  . Financial resource strain: Not on file  . Food insecurity:    Worry: Not on file    Inability: Not on file  . Transportation needs:    Medical: Not on file    Non-medical: Not on file  Tobacco Use  . Smoking status: Never Smoker  . Smokeless tobacco: Never Used  Substance and Sexual Activity  . Alcohol use: No    Comment: wine- occ.  . Drug use: No  . Sexual activity: Yes    Birth control/protection: Surgical  Lifestyle  . Physical activity:    Days per week: Not on file    Minutes per session: Not on file    . Stress: Not on file  Relationships  . Social connections:    Talks on phone: Not on file    Gets together: Not on file    Attends religious service: Not on file    Active member of club or organization: Not on file    Attends meetings of clubs or organizations: Not on file    Relationship status: Not on file  Other Topics Concern  . Not on file  Social History Narrative  . Not on file     Family History: The patient's family history includes Alcohol abuse in her sister; Cancer in her mother; Diabetes in her brother, brother, brother, brother, sister, and sister; Heart disease in her mother; Hypertension in her brother, brother, brother, brother, brother, father, mother, sister, sister, and sister. There is no history of Breast cancer.  ROS:   Please see the history of present illness.    All other systems reviewed and are negative.  EKGs/Labs/Other Studies Reviewed:    The following studies were reviewed today: Stress test report was discussed with the patient at length.    Recent Labs: 02/09/2017: BUN 19; Creat 0.89; Potassium 5.6; Sodium 142  Recent Lipid Panel    Component Value Date/Time   CHOL 160 03/30/2016 0713   TRIG 80 03/30/2016 0713   HDL 62 03/30/2016 0713   CHOLHDL 2.6 03/30/2016 0713   VLDL 16 03/30/2016 0713   LDLCALC 82 03/30/2016 0713    Physical Exam:    VS:  BP 124/72 (BP Location: Right Arm, Patient Position: Sitting, Cuff Size: Normal)   Pulse (!) 59   Ht 5\' 5"  (1.651 m)   Wt 266 lb (120.7 kg)   SpO2 98%   BMI 44.26 kg/m     Wt Readings from Last 3 Encounters:  01/06/18 266 lb (120.7 kg)  09/19/17 298 lb 12.8 oz (135.5 kg)  08/29/17 (!) 305 lb 1.9 oz (138.4 kg)     GEN: Patient is in no acute distress HEENT: Normal NECK: No JVD; No carotid bruits LYMPHATICS: No lymphadenopathy CARDIAC: Hear sounds regular, 2/6 systolic murmur at the apex. RESPIRATORY:  Clear to auscultation without rales, wheezing or rhonchi  ABDOMEN: Soft,  non-tender, non-distended MUSCULOSKELETAL:  No edema; No deformity  SKIN: Warm and dry NEUROLOGIC:  Alert and oriented x 3 PSYCHIATRIC:  Normal affect   Signed, Jenean Lindau, MD  01/06/2018 11:27 AM    [place]

## 2018-01-16 ENCOUNTER — Other Ambulatory Visit: Payer: Self-pay | Admitting: Family Medicine

## 2018-01-16 DIAGNOSIS — Z1231 Encounter for screening mammogram for malignant neoplasm of breast: Secondary | ICD-10-CM

## 2018-01-18 ENCOUNTER — Other Ambulatory Visit: Payer: Self-pay | Admitting: Family Medicine

## 2018-01-18 ENCOUNTER — Telehealth: Payer: Self-pay | Admitting: Family Medicine

## 2018-01-18 MED ORDER — BENZONATATE 100 MG PO CAPS: 100.0000 mg | ORAL_CAPSULE | Freq: Two times a day (BID) | ORAL | 0 refills | Status: DC | PRN

## 2018-01-18 NOTE — Telephone Encounter (Signed)
Patient is requesting some medication for sore throat with drainage, headache, and congestion. Pharmacy: [facility] Cb#: [phone]

## 2018-01-18 NOTE — Telephone Encounter (Signed)
Left message on machine. Was unable to speak with patient

## 2018-01-18 NOTE — Telephone Encounter (Signed)
For sore throat, headache and congestion I recommend tessalon perles for congestion, which I will prescribe, and oTC tylenol 325 mg one twice daily for headache and sore throat, and for head congestion, oTC zyrtec or claritin, I will send the tessalon perles to her pharmacy on file, please let her know

## 2018-01-18 NOTE — Telephone Encounter (Signed)
Medication was sent tio the pharmacy in [place]

## 2018-01-19 NOTE — Telephone Encounter (Signed)
Patient aware.

## 2018-01-19 NOTE — Telephone Encounter (Signed)
Patient aware of Dr.Simpsons recommendations.

## 2018-02-02 ENCOUNTER — Other Ambulatory Visit: Payer: Self-pay | Admitting: Family Medicine

## 2018-02-15 ENCOUNTER — Encounter: Payer: Self-pay | Admitting: Family Medicine

## 2018-02-15 ENCOUNTER — Ambulatory Visit: Payer: BC Managed Care – PPO | Admitting: Family Medicine

## 2018-02-15 VITALS — BP 110/78 | HR 87 | Resp 16 | Ht 65.0 in | Wt 262.0 lb

## 2018-02-15 DIAGNOSIS — I10 Essential (primary) hypertension: Secondary | ICD-10-CM

## 2018-02-15 DIAGNOSIS — J3089 Other allergic rhinitis: Secondary | ICD-10-CM | POA: Diagnosis not present

## 2018-02-15 DIAGNOSIS — M159 Polyosteoarthritis, unspecified: Secondary | ICD-10-CM

## 2018-02-15 DIAGNOSIS — E559 Vitamin D deficiency, unspecified: Secondary | ICD-10-CM

## 2018-02-15 DIAGNOSIS — M150 Primary generalized (osteo)arthritis: Secondary | ICD-10-CM | POA: Diagnosis not present

## 2018-02-15 MED ORDER — FLUTICASONE PROPIONATE 50 MCG/ACT NA SUSP: 2.0000 | Freq: Every day | NASAL | 6 refills | Status: DC

## 2018-02-15 MED ORDER — CLOBETASOL PROPIONATE 0.05 % EX CREA: TOPICAL_CREAM | CUTANEOUS | 0 refills | Status: DC

## 2018-02-15 MED ORDER — BENZONATATE 100 MG PO CAPS
100.0000 mg | ORAL_CAPSULE | Freq: Two times a day (BID) | ORAL | 1 refills | Status: DC | PRN
Start: 2018-02-15 — End: 2018-07-20

## 2018-02-15 NOTE — Patient Instructions (Signed)
F/U in 5 months, call if you need me before  Blood pressure and health are great.  Thankful you are doing very well  Please start daily exercise   Labs today CBC, lipid, cmp and EGFr, TSH, Vit D  It is important that you exercise regularly at least 30 minutes 5 times a week. If you develop chest pain, have severe difficulty breathing, or feel very tired, stop exercising immediately and seek medical attention   

## 2018-02-16 LAB — LIPID PANEL
CHOLESTEROL: 124 mg/dL (ref <200)
HDL: 45 mg/dL — AB (ref >50)
LDL Cholesterol (Calc): 66 mg/dL (calc)
NON-HDL CHOLESTEROL (CALC): 79 mg/dL (ref <130)
TRIGLYCERIDES: 46 mg/dL (ref <150)
Total CHOL/HDL Ratio: 2.8 (calc) (ref <5.0)

## 2018-02-16 LAB — COMPLETE METABOLIC PANEL WITH GFR
AG RATIO: 1.4 (calc) (ref 1.0–2.5)
ALBUMIN MSPROF: 4 g/dL (ref 3.6–5.1)
ALT: 23 U/L (ref 6–29)
AST: 22 U/L (ref 10–35)
Alkaline phosphatase (APISO): 85 U/L (ref 33–130)
BUN: 12 mg/dL (ref 7–25)
CALCIUM: 9.3 mg/dL (ref 8.6–10.4)
CO2: 30 mmol/L (ref 20–32)
Chloride: 105 mmol/L (ref 98–110)
Creat: 0.63 mg/dL (ref 0.50–0.99)
GFR, EST AFRICAN AMERICAN: 111 mL/min/{1.73_m2} (ref >=60)
GFR, EST NON AFRICAN AMERICAN: 95 mL/min/{1.73_m2} (ref >=60)
Globulin: 2.9 g/dL (calc) (ref 1.9–3.7)
Glucose, Bld: 93 mg/dL (ref 65–99)
POTASSIUM: 4 mmol/L (ref 3.5–5.3)
Sodium: 139 mmol/L (ref 135–146)
TOTAL PROTEIN: 6.9 g/dL (ref 6.1–8.1)
Total Bilirubin: 0.9 mg/dL (ref 0.2–1.2)

## 2018-02-16 LAB — CBC
HEMATOCRIT: 40 % (ref 35.0–45.0)
Hemoglobin: 13.3 g/dL (ref 11.7–15.5)
MCH: 28.2 pg (ref 27.0–33.0)
MCHC: 33.3 g/dL (ref 32.0–36.0)
MCV: 84.9 fL (ref 80.0–100.0)
MPV: 12.2 fL (ref 7.5–12.5)
Platelets: 202 10*3/uL (ref 140–400)
RBC: 4.71 10*6/uL (ref 3.80–5.10)
RDW: 13 % (ref 11.0–15.0)
WBC: 4 10*3/uL (ref 3.8–10.8)

## 2018-02-16 LAB — TSH: TSH: 1.73 mIU/L (ref 0.40–4.50)

## 2018-02-16 LAB — VITAMIN D 25 HYDROXY (VIT D DEFICIENCY, FRACTURES): Vit D, 25-Hydroxy: 41 ng/mL (ref 30–100)

## 2018-02-17 ENCOUNTER — Telehealth: Payer: Self-pay | Admitting: Family Medicine

## 2018-02-17 NOTE — Telephone Encounter (Signed)
pls print and mail recent labs to her with the note that they are all EXCELLENT The only thing that is "abnormal' is that her HDL, which is the "good cholesterol", and that is improved with exercise. I had tried calling her , she was out of her office at the time I called and she may call back , you can give her the result  Message verbally and still mail her the labs, please

## 2018-02-19 ENCOUNTER — Encounter: Payer: Self-pay | Admitting: Family Medicine

## 2018-02-19 NOTE — Assessment & Plan Note (Signed)
Controlled, no change in medication DASH diet and commitment to daily physical activity for a minimum of 30 minutes discussed and encouraged, as a part of hypertension management. The importance of attaining a healthy weight is also discussed.  BP/Weight 02/15/2018 01/06/2018 11/15/2017 09/19/2017 08/29/2017 05/23/2017 0/88/1103  Systolic BP 159 458 592 924 [phone]  Diastolic BP 78 72 82 70 78 68 80  Wt. (Lbs) 262 266 - 298.8 305.12 323.4 330  BMI 43.6 44.26 - 49.72 50.77 53.82 54.91

## 2018-02-19 NOTE — Assessment & Plan Note (Signed)
Improved , and doing very well post surgery Patient re-educated about  the importance of commitment to a  minimum of 150 minutes of exercise per week.  The importance of healthy food choices with portion control discussed.  Weight /BMI 02/15/2018 01/06/2018 09/19/2017  WEIGHT 262 lb 266 lb 298 lb 12.8 oz  HEIGHT 5\' 5"  5\' 5"  5\' 5"   BMI 43.6 kg/m2 44.26 kg/m2 49.72 kg/m2

## 2018-02-19 NOTE — Assessment & Plan Note (Signed)
Controlled on medication currently taking , will continue same

## 2018-02-19 NOTE — Progress Notes (Signed)
   Stefanie Taylor     MRN: [id number]      DOB: 12/14/1955   HPI [name] is here for follow up and re-evaluation of chronic medical conditions, medication management and review of any available recent lab and radiology data.  Preventive health is updated, specifically  Cancer screening and Immunization.   . The PT denies any adverse reactions to current medications since the last visit.  Needs to start exercise commitment, doing well with her diet, no craving for wine. Denies light headedness, states takes in more liquid than solid as nutrition ROS Denies recent fever or chills. Denies sinus pressure, nasal congestion, ear pain or sore throat. Denies chest congestion, productive cough or wheezing. Denies chest pains, palpitations and leg swelling Denies abdominal pain, nausea, vomiting,diarrhea or constipation.   Denies dysuria, frequency, hesitancy or incontinence. Reports ess joint pain, swelling and limitation in mobility. Denies headaches, seizures, numbness, or tingling. Denies depression, anxiety or insomnia. Denies skin break down or rash.   PE  BP 110/78   Pulse 87   Resp 16   Ht 5\' 5"  (1.651 m)   Wt 262 lb (118.8 kg)   SpO2 97%   BMI 43.60 kg/m   Patient alert and oriented and in no cardiopulmonary distress.  HEENT: No facial asymmetry, EOMI,   oropharynx pink and moist.  Neck supple   Chest: Clear to auscultation bilaterally.  CVS: S1, S2 no murmurs, no S3.Regular rate.  ABD: Soft non tender.   Ext: No edema  MS: Adequate ROM spine, shoulders, hips and knees.  Skin: Intact, no ulcerations or rash noted.  Psych: Good eye contact, normal affect. Memory intact not anxious or depressed appearing.  CNS: CN 2-12 intact, power,  normal throughout.no focal deficits noted.   Assessment & Plan HTN (hypertension) Controlled, no change in medication DASH diet and commitment to daily physical activity for a minimum of 30 minutes discussed and  encouraged, as a part of hypertension management. The importance of attaining a healthy weight is also discussed.  BP/Weight 02/15/2018 01/06/2018 11/15/2017 09/19/2017 08/29/2017 05/23/2017 03/16/8888  Systolic BP 169 450 388 828 003 491 791  Diastolic BP 78 72 82 70 78 68 80  Wt. (Lbs) 262 266 - 298.8 305.12 323.4 330  BMI 43.6 44.26 - 49.72 50.77 53.82 54.91       Morbid obesity, Weight - 324, BMI - 53.9. Improved , and doing very well post surgery Patient re-educated about  the importance of commitment to a  minimum of 150 minutes of exercise per week.  The importance of healthy food choices with portion control discussed.  Weight /BMI 02/15/2018 01/06/2018 09/19/2017  WEIGHT 262 lb 266 lb 298 lb 12.8 oz  HEIGHT 5\' 5"  5\' 5"  5\' 5"   BMI 43.6 kg/m2 44.26 kg/m2 49.72 kg/m2      Osteoarthritis Reduced symptoms with weight loss , will start exercise commitment in the pool in the near future, acknowledges the need  Allergic rhinitis Controlled on medication currently taking , will continue same

## 2018-02-19 NOTE — Assessment & Plan Note (Signed)
Reduced symptoms with weight loss , will start exercise commitment in the pool in the near future, acknowledges the need

## 2018-02-20 NOTE — Telephone Encounter (Signed)
Labs mailed to patient with note as per Dr.Simpson.

## 2018-03-01 ENCOUNTER — Telehealth: Payer: Self-pay | Admitting: Family Medicine

## 2018-03-01 NOTE — Telephone Encounter (Signed)
Please call at your convience

## 2018-03-02 ENCOUNTER — Other Ambulatory Visit: Payer: Self-pay | Admitting: Family Medicine

## 2018-03-13 ENCOUNTER — Ambulatory Visit
Admission: RE | Admit: 2018-03-13 | Discharge: 2018-03-13 | Disposition: A | Discharge status: Home / Self Care | Payer: BC Managed Care – PPO | Source: Ambulatory Visit | Attending: Family Medicine | Admitting: Family Medicine

## 2018-03-13 DIAGNOSIS — Z1231 Encounter for screening mammogram for malignant neoplasm of breast: Secondary | ICD-10-CM

## 2018-07-18 ENCOUNTER — Ambulatory Visit: Payer: BC Managed Care – PPO | Admitting: Family Medicine

## 2018-07-20 ENCOUNTER — Encounter: Payer: Self-pay | Admitting: Family Medicine

## 2018-07-20 ENCOUNTER — Ambulatory Visit: Payer: BC Managed Care – PPO | Admitting: Family Medicine

## 2018-07-20 VITALS — BP 120/70 | HR 64 | Resp 16 | Ht 65.0 in

## 2018-07-20 DIAGNOSIS — Z23 Encounter for immunization: Secondary | ICD-10-CM | POA: Diagnosis not present

## 2018-07-20 DIAGNOSIS — M150 Primary generalized (osteo)arthritis: Secondary | ICD-10-CM

## 2018-07-20 DIAGNOSIS — I10 Essential (primary) hypertension: Secondary | ICD-10-CM | POA: Diagnosis not present

## 2018-07-20 DIAGNOSIS — R3912 Poor urinary stream: Secondary | ICD-10-CM

## 2018-07-20 DIAGNOSIS — M159 Polyosteoarthritis, unspecified: Secondary | ICD-10-CM

## 2018-07-20 MED ORDER — MINOXIDIL 2.5 MG PO TABS: 2.5000 mg | ORAL_TABLET | Freq: Every day | ORAL | 2 refills | Status: DC

## 2018-07-20 NOTE — Patient Instructions (Addendum)
F/U in 5.5 months, call if you ned me before Nurse visit in 2 month for shingrix #1 , then MD appt in 5.5 months for shingrix #2 Flu vaccine today  It is important that you exercise regularly at least 30 minutes 5 times a week. If you develop chest pain, have severe difficulty breathing, or feel very tired, stop exercising immediately and seek medical attention    Continue to commit to weekly weights , and do not return to  high calorie food or drink please  You are being referred to [facility]

## 2018-07-20 NOTE — Progress Notes (Signed)
Stefanie Taylor     MRN: [id number]      DOB: Oct 17, 1955   HPI Stefanie Taylor is here for follow up and re-evaluation of chronic medical conditions, medication management and review of any available recent lab and radiology data.  Preventive health is updated, specifically  Cancer screening and Immunization.   2 week h/o difficulty with urination, has to squat for her urine to come out, denies burning , fever or chills C/o excessive skin feels she has 40 pounds of skin that need to be removed, she feels that is taken car of she will be nearer to her weigh goal of 200 pounds, though 150 is medically recommended, and she refuses to weigh, states her weight loss is not as fast as desired by Medics, buit she is happy an also states she is not drinklng her extra calories as she used to The PT denies any adverse reactions to current medications since the last visit.  Not walking as in the past, needs tp get to water aerobics and intends to    ROS Denies recent fever or chills. Denies sinus pressure, nasal congestion, ear pain or sore throat. Denies chest congestion, productive cough or wheezing. Denies chest pains, palpitations and leg swelling Denies abdominal pain, nausea, vomiting,diarrhea or constipation.   Denies dysuria, frequency, hesitancy or incontinence. Reduced ankle pain still has knee pain and plans surgery In the future Denies headaches, seizures, numbness, or tingling. Denies depression, anxiety or insomnia. Denies skin break down or rash.   PE  BP 120/70   Pulse 64   Resp 16   Ht 5\' 5"  (1.651 m)   SpO2 98%   BMI 43.60 kg/m   Patient alert and oriented and in no cardiopulmonary distress.  HEENT: No facial asymmetry, EOMI,   oropharynx pink and moist.  Neck supple no JVD, no mass.  Chest: Clear to auscultation bilaterally.  CVS: S1, S2 no murmurs, no S3.Regular rate.  ABD: Soft non tender.   Ext: No edema  MS: Adequate ROM spine, shoulders, hips and  reduced in  knees.  Skin: Intact, no ulcerations or rash noted.  Psych: Good eye contact, normal affect. Memory intact not anxious or depressed appearing.  CNS: CN 2-12 intact, power,  normal throughout.no focal deficits noted.   Assessment & Plan  HTN (hypertension) Controlled, no change in medication DASH diet and commitment to daily physical activity for a minimum of 30 minutes discussed and encouraged, as a part of hypertension management. The importance of attaining a healthy weight is also discussed.  BP/Weight 07/20/2018 02/15/2018 01/06/2018 11/15/2017 09/19/2017 08/29/2017 03/19/2375  Systolic BP 283 151 761 607 371 062 694  Diastolic BP 70 78 72 82 70 78 68  Wt. (Lbs) - 262 266 - 298.8 305.12 323.4  BMI 43.6 43.6 44.26 - 49.72 50.77 53.82       Poor urinary stream 2 week h/o needing to squat with urination and poor stream, needs urology eval  Morbid obesity, Weight - 324, BMI - 53.9. Improved, though pt refuses to weigh Patient re-educated about  the importance of commitment to a  minimum of 150 minutes of exercise per week.  The importance of healthy food choices with portion control discussed. Encouraged to start a food diary, count calories and to consider  joining a support group. Sample diet sheets offered. Goals set by the patient for the next several months.   Weight /BMI 07/20/2018 02/15/2018 01/06/2018  WEIGHT - 262 lb 266 lb  HEIGHT 5\' 5"   5\' 5"  5\' 5"   BMI 43.6 kg/m2 43.6 kg/m2 44.26 kg/m2      Need for immunization against influenza After obtaining informed consent, the vaccine is  administered by LPN.   Osteoarthritis Less pain in ankles , but still c/o significant knee pain , requiring surgery in the future

## 2018-07-20 NOTE — Assessment & Plan Note (Signed)
Improved, though pt refuses to weigh Patient re-educated about  the importance of commitment to a  minimum of 150 minutes of exercise per week.  The importance of healthy food choices with portion control discussed. Encouraged to start a food diary, count calories and to consider  joining a support group. Sample diet sheets offered. Goals set by the patient for the next several months.   Weight /BMI 07/20/2018 02/15/2018 01/06/2018  WEIGHT - 262 lb 266 lb  HEIGHT 5\' 5"  5\' 5"  5\' 5"   BMI 43.6 kg/m2 43.6 kg/m2 44.26 kg/m2

## 2018-07-20 NOTE — Assessment & Plan Note (Addendum)
2 week h/o needing to squat with urination and poor stream, needs urology eval

## 2018-07-20 NOTE — Assessment & Plan Note (Signed)
Less pain in ankles , but still c/o significant knee pain , requiring surgery in the future

## 2018-07-20 NOTE — Assessment & Plan Note (Signed)
Controlled, no change in medication DASH diet and commitment to daily physical activity for a minimum of 30 minutes discussed and encouraged, as a part of hypertension management. The importance of attaining a healthy weight is also discussed.  BP/Weight 07/20/2018 02/15/2018 01/06/2018 11/15/2017 09/19/2017 08/29/2017 3/58/2518  Systolic BP 984 [phone] 736  Diastolic BP 70 78 72 82 70 78 68  Wt. (Lbs) - 262 266 - 298.8 305.12 323.4  BMI 43.6 43.6 44.26 - 49.72 50.77 53.82

## 2018-07-20 NOTE — Assessment & Plan Note (Signed)
After obtaining informed consent, the vaccine is  administered by LPN.  

## 2018-09-22 ENCOUNTER — Ambulatory Visit: Payer: BC Managed Care – PPO

## 2018-09-22 DIAGNOSIS — Z23 Encounter for immunization: Secondary | ICD-10-CM

## 2018-09-22 NOTE — Progress Notes (Signed)
Shingrix vaccine administered 0.37ml IM in left deltoid

## 2018-12-14 ENCOUNTER — Ambulatory Visit (INDEPENDENT_AMBULATORY_CARE_PROVIDER_SITE_OTHER): Payer: BC Managed Care – PPO | Admitting: Family Medicine

## 2018-12-14 ENCOUNTER — Other Ambulatory Visit: Payer: Self-pay

## 2018-12-14 ENCOUNTER — Encounter: Payer: Self-pay | Admitting: Family Medicine

## 2018-12-14 ENCOUNTER — Other Ambulatory Visit: Payer: Self-pay | Admitting: Family Medicine

## 2018-12-14 ENCOUNTER — Telehealth: Payer: Self-pay | Admitting: *Deleted

## 2018-12-14 VITALS — Ht 65.0 in

## 2018-12-14 DIAGNOSIS — L309 Dermatitis, unspecified: Secondary | ICD-10-CM

## 2018-12-14 MED ORDER — HYDROXYZINE HCL 25 MG PO TABS: ORAL_TABLET | ORAL | 0 refills | Status: DC

## 2018-12-14 MED ORDER — PREDNISONE 10 MG PO TABS: 10.0000 mg | ORAL_TABLET | Freq: Two times a day (BID) | ORAL | 0 refills | Status: DC

## 2018-12-14 MED ORDER — FAMOTIDINE 20 MG PO TABS: 20.0000 mg | ORAL_TABLET | Freq: Every day | ORAL | 0 refills | Status: DC

## 2018-12-14 NOTE — Assessment & Plan Note (Signed)
4 day h/o progressive symptoms, no known trigger, no airway compromise noted at this time. Prednisone, pepcid and hydroxyzine are prescribed, advised , should symptoms worsen, to go to the nearest urgent care or ED. Verbalized understanding and agreement with the plan

## 2018-12-14 NOTE — Telephone Encounter (Signed)
NO prednisone , she is allergic to prednisone, I  Will generate a telephone visit

## 2018-12-14 NOTE — Telephone Encounter (Signed)
Pt called in stating that about 2017 she had an allergic reaction and it caused her face to swell. She woke up this morning and its not causing her whole face to swell but her lips are swollen and its red around them. There are no other symptoms. She stated its just red discomforting and burning. Wanted to know what Dr. Moshe Cipro recommended.

## 2018-12-14 NOTE — Patient Instructions (Signed)
F/U as before, call if you need me sooner.  You are treated for an Allergic reaction, unknown trigger, affecting lips   Prednisone, pepcid and hydroxyzine have been sent to your pharmacy, as we discussed.  Should the swelling worsen ,should you develop difficulty breathing ,or should you experience an allergic reaction to the medications that have been prescribed you need to go immediately to the nearest [facility] facility or the Emergency Room  Social distancing. Frequent hand washing with soap and water Keeping your hands off of your face. These 3 practices will help to keep both you and your community healthy during this time. Please practice them faithfully!  Thanks for choosing [facility], we consider it a privelige to serve you.

## 2018-12-14 NOTE — Telephone Encounter (Signed)
pls ask if she is able to send a photo of the area,if she can, please do,  if not and no new cosmetics medications or foods, IN case the symptoms worsen, then I am sending in a 5 day low dose of prednisone tablets  5 mg twice daily

## 2018-12-14 NOTE — Telephone Encounter (Signed)
Phone visit scheduled

## 2018-12-14 NOTE — Progress Notes (Signed)
Virtual Visit via Telephone Note  I connected with Stefanie Taylor on 12/14/18 at 11:00 AM EDT by telephone and verified that I am speaking with the correct person using two identifiers.   I discussed the limitations, risks, security and privacy concerns of performing an evaluation and management service by telephone and the availability of in person appointments. I also discussed with the patient that there may be a patient responsible charge related to this service. The patient expressed understanding and agreed to proceed. Patient is in her office and I am at my home   History of Present Illness: 4 day h/o swelling of lips, no response to benadryl, no known specific trigger, has had similar reaction in the past, except that entire face became swollen, recalls responding to prednisone. Unfortunately, prednisone is listed as an allergy, however no specific reaction know, recalled or noted, states tat she is willing to try the prednisone e if this will help. 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 Prednisone, pepcid and hydroxyzine are prescribed, advised , should symptoms worsen, to go to the nearest urgent care or ED. Verbalized understanding and agreement with the plan   Follow Up Instructions:    I discussed the assessment and treatment plan with the patient. The patient was provided an opportunity to ask questions and all were answered. The patient agreed with the plan and demonstrated an understanding of the instructions.   The patient was advised to call back or seek an in-person evaluation if the symptoms worsen or if  the condition fails to improve as anticipated.  I provided 10 minutes of non-face-to-face time during this encounter.   Tula Nakayama, MD

## 2018-12-27 ENCOUNTER — Other Ambulatory Visit: Payer: Self-pay | Admitting: Family Medicine

## 2018-12-28 ENCOUNTER — Telehealth: Payer: Self-pay | Admitting: *Deleted

## 2018-12-28 NOTE — Telephone Encounter (Signed)
Pt saw Dr Moshe Cipro on 4/2 and was prescribed prednisone, hydroxyzine and famotidine and her symptoms cleared up and she said she spent about an hour outside yesterday and she knew better and she woke up today and its back. No tongue swelling or SOB, just the same as before. Wanted to know if you would refill those meds one more time since they cleared it up before and she would stay inside to keep it from happening again

## 2018-12-28 NOTE — Telephone Encounter (Signed)
Pt called about her mouth swelling. She talked to Dr. Moshe Cipro 4-2 and Dr. Moshe Cipro called her in medication. It got better however she went outside Easter Sunday and forgot that she was allergic to the environment. Now her mouth is swollen severely and disfigured. Wanted to know if the medication could be called in or what she needed to do

## 2018-12-29 ENCOUNTER — Other Ambulatory Visit: Payer: Self-pay

## 2018-12-29 ENCOUNTER — Ambulatory Visit (INDEPENDENT_AMBULATORY_CARE_PROVIDER_SITE_OTHER): Payer: BC Managed Care – PPO | Admitting: Family Medicine

## 2018-12-29 ENCOUNTER — Encounter: Payer: Self-pay | Admitting: Family Medicine

## 2018-12-29 DIAGNOSIS — T7840XS Allergy, unspecified, sequela: Secondary | ICD-10-CM

## 2018-12-29 DIAGNOSIS — T783XXS Angioneurotic edema, sequela: Secondary | ICD-10-CM

## 2018-12-29 MED ORDER — CETIRIZINE HCL 10 MG PO TABS: 10.0000 mg | ORAL_TABLET | Freq: Every day | ORAL | 3 refills | Status: DC

## 2018-12-29 MED ORDER — PREDNISONE 10 MG PO TABS: 10.0000 mg | ORAL_TABLET | Freq: Two times a day (BID) | ORAL | 0 refills | Status: DC

## 2018-12-29 MED ORDER — FAMOTIDINE 20 MG PO TABS: 20.0000 mg | ORAL_TABLET | Freq: Every day | ORAL | 0 refills | Status: DC

## 2018-12-29 MED ORDER — HYDROXYZINE HCL 25 MG PO TABS: ORAL_TABLET | ORAL | 0 refills | Status: AC

## 2018-12-29 NOTE — Patient Instructions (Addendum)
    Thank you for your office visit today via telemedicine. I appreciate the opportunity to provide you with the care for your health and wellness. Today we discussed: Lip swelling and mouth sores.  I have sent in for medications to [facility].  Prednisone, hydroxyzine, Pepcid: Please take all of these for the next 5 days 7 days for the Pepcid to help with the lip angioedema, swelling that you are experiencing.  I have started you on Zyrtec, this is an allergy medication this medication is to be taken daily especially during seasonal times.  I would like you to continue this medication for the next several months to see if you continue to have any of the symptoms that you are having right now.  In case you have developed an allergy to something that you are eating.  If the allergy does not return or you do not have any more lip swelling we should probably continue to keep you on an antihistamine daily.  If the allergy does return or you have return of lip swelling mouth sores or difficulty with swelling of the face then we should really consider you going to an allergist to be tested.  [place] YOUR HANDS WELL AND FREQUENTLY. AVOID TOUCHING YOUR FACE, UNLESS YOUR HANDS ARE FRESHLY WASHED.  GET FRESH AIR DAILY. STAY HYDRATED WITH WATER.   It was a pleasure to see you and I look forward to continuing to work together on your health and well-being. Please do not hesitate to call the office if you need care or have questions about your care.  Have a wonderful day and week. With Gratitude, Cherly Beach, DNP, AGNP-BC

## 2018-12-29 NOTE — Progress Notes (Signed)
Virtual Visit via Video Note  I connected with Stefanie Taylor on 12/29/18 at  9:30 AM EDT by a video enabled telemedicine application and verified that I am speaking with the correct person using two identifiers.   I discussed the limitations of evaluation and management by telemedicine and the availability of in person appointments. The patient expressed understanding and agreed to proceed. Location of Patient: Home Location of Provider: Telehealth Consent was obtain for visit to be over via telehealth.  History of Present Illness: Stefanie Taylor is a 63 year old female patient of Dr. Griffin Dakin.  She was previously seen on April 2 of this year secondary to having a 4-day history of lip swelling.  There was no response or relief or improvement with Benadryl.  She did not know what actually had caused it.  But had reported that she had been walking around outside previously.  Additionally reports that she had this event happened back in 2012, with that she had swollen face that she woke up with.  She was given prednisone at that time and she did well with that.  Even though that is listed as an allergy.  Today she reports that she woke up on Sunday with having the mouth swelling.  Secondary to being outside over the weekend.  She reports Monday through Tuesday she developed blisters in her mouth.  Ports her mouth is normally pink but it is now red.  Reports that she has burning some in her mouth, but most of the burning is at the corners of her mouth.  Wednesday she reported that she ate tomato basal soup with some cheddar cheese.  She says that her mouth swelled even more that evening into yesterday.  She thinks that it is related to what she calls hidden salt.  Reports that she drinks a lot of water to help flush out her system.  And she has had some slight improvement over the last 24 hours.  Today we are on WebEx but she is unable to figure out how to turn her video component on.  So this is not  able to be visualized today. She denies any difficulty with swallowing, breathing.  Denies that her tongue is swollen.  Or any airway compromise.  She is communicating well without cough without shortness of breath.  She denies having chest pain, chest tightness, shortness of breath, wheezing.  He denies having any blisters around the mouth.  She denies any skin breakdown or peeling at this time.  Or drainage.  Denies having any fevers or chills.  She denies having any other allergy-like symptoms.  Reports that she is unsure as to what might of caused this.  But she does know that she could have developed some allergies.  Past Medical, Surgical, Social History, Allergies, and Medications have been Reviewed. Review of Systems  Constitutional: Negative.   HENT: Negative.   Eyes: Negative.   Respiratory: Negative.   Cardiovascular: Negative.   Gastrointestinal: Negative.   Genitourinary: Negative.   Musculoskeletal: Negative.   Skin:       Swelling of the lips, mouth sores  Neurological: Negative for dizziness and headaches.  Endo/Heme/Allergies: Negative.   Psychiatric/Behavioral: Negative.   All other systems reviewed and are negative.   Observations/Objective: Physical Exam Pulmonary:     Comments: Urine WebEx telephone call there is no noted shortness of breath difficulty communicating or talking or breathing.  No stridor is heard Neurological:     Mental Status: She is alert and oriented to person,  place, and time.  Psychiatric:        Attention and Perception: Attention and perception normal.        Mood and Affect: Mood and affect normal.        Speech: Speech normal.        Behavior: Behavior normal. Behavior is cooperative.        Thought Content: Thought content normal.        Cognition and Memory: Cognition and memory normal.        Judgment: Judgment normal.     Comments: Communication throughout WebEx telephone call.     Assessment and Plan: 1. Angioedema of lips,  sequela Signs and symptoms today are consistent with angioedema of the lips.  Unsure if this is an allergic reaction.  Versus something else that might be going on.  Have some improvement when Dr. Moshe Cipro had previously provided her with prednisone, Atarax.  It does not look like she actually took the Pepcid previously though.  Given that that was successful this probably is something allergy related or immune response related. We will do a repeat of Dr. Griffin Dakin previous course of medication.  Educated the patient that should this swelling occur again she should probably be referred to an allergist to make sure that there is nothing else going on or that she has not developed a food allergy.  As to each time that she is exposed to something that she is allergic to this could compromise her airway or make it difficult for her to breathe.Patient acknowledged agreement and understanding of the plan.   Reviewed side effects, risks and benefits of medication.     - famotidine (PEPCID) 20 MG tablet; Take 1 tablet (20 mg total) by mouth daily for 7 days. Please take for lip swelling.  Dispense: 7 tablet; Refill: 0 - hydrOXYzine (ATARAX/VISTARIL) 25 MG tablet; Take one to two tablets at bedtime as needed , for lip swelling  Dispense: 14 tablet; Refill: 0 - predniSONE (DELTASONE) 10 MG tablet; Take 1 tablet (10 mg total) by mouth 2 (two) times daily with a meal for 5 days.  Dispense: 10 tablet; Refill: 0  2. Allergic state, sequela Due to the nature of her presentation and questionable allergy.  Would like to start her on Zyrtec to see if she can take this daily to avoid any future lip swelling or allergic reactions and states.  If she takes the Zyrtec as directed and does develop some more lip swelling.  She will need to be referred to an allergist for further testing to make sure that she is not consuming or eating something that is causing the angioedema of her lips.  As a would not like this to progress to  angioedema of the airway.  Educated her on the development of allergies as we get older. Additionally, discussed with her on the risk of not identifying the allergy culprit when it comes to facial or mouth swelling.  She is in agreements of being referred to the allergist if this returns again.  Understands to take the Zyrtec daily as well to help possibly avoid this in future. Patient acknowledged agreement and understanding of the plan. Reviewed side effects, risks and benefits of medication.     - famotidine (PEPCID) 20 MG tablet; Take 1 tablet (20 mg total) by mouth daily for 7 days. Please take for lip swelling.  Dispense: 7 tablet; Refill: 0 - hydrOXYzine (ATARAX/VISTARIL) 25 MG tablet; Take one to two tablets at bedtime as  needed , for lip swelling  Dispense: 14 tablet; Refill: 0 - predniSONE (DELTASONE) 10 MG tablet; Take 1 tablet (10 mg total) by mouth 2 (two) times daily with a meal for 5 days.  Dispense: 10 tablet; Refill: 0 - cetirizine (ZYRTEC) 10 MG tablet; Take 1 tablet (10 mg total) by mouth daily.  Dispense: 90 tablet; Refill: 3   Follow Up Instructions: AVS printed and mailed   I discussed the assessment and treatment plan with the patient. The patient was provided an opportunity to ask questions and all were answered. The patient agreed with the plan and demonstrated an understanding of the instructions.   The patient was advised to call back or seek an in-person evaluation if the symptoms worsen or if the condition fails to improve as anticipated.  I provided 10 minutes of non-face-to-face time during this encounter.   Perlie Mayo, NP

## 2019-01-03 ENCOUNTER — Encounter: Payer: Self-pay | Admitting: Family Medicine

## 2019-01-03 ENCOUNTER — Ambulatory Visit (INDEPENDENT_AMBULATORY_CARE_PROVIDER_SITE_OTHER): Payer: BC Managed Care – PPO | Admitting: Family Medicine

## 2019-01-03 ENCOUNTER — Other Ambulatory Visit: Payer: Self-pay

## 2019-01-03 VITALS — BP 118/74 | HR 57 | Resp 15 | Ht 65.0 in | Wt 240.1 lb

## 2019-01-03 DIAGNOSIS — I10 Essential (primary) hypertension: Secondary | ICD-10-CM

## 2019-01-03 DIAGNOSIS — L309 Dermatitis, unspecified: Secondary | ICD-10-CM

## 2019-01-03 DIAGNOSIS — E559 Vitamin D deficiency, unspecified: Secondary | ICD-10-CM

## 2019-01-03 DIAGNOSIS — Z23 Encounter for immunization: Secondary | ICD-10-CM

## 2019-01-03 DIAGNOSIS — Z889 Allergy status to unspecified drugs, medicaments and biological substances status: Secondary | ICD-10-CM

## 2019-01-03 MED ORDER — FAMOTIDINE 20 MG PO TABS: 20.0000 mg | ORAL_TABLET | Freq: Every day | ORAL | 0 refills | Status: DC

## 2019-01-03 MED ORDER — PREDNISONE 10 MG PO TABS: 10.0000 mg | ORAL_TABLET | Freq: Two times a day (BID) | ORAL | 0 refills | Status: DC

## 2019-01-03 NOTE — Patient Instructions (Addendum)
F/U in 6 months, call if you need me before  Prednisone is prescribed for an additional 5 days for you to have on hand , should you need it, also famotidine one daily for 7 days to protect your stomach when on the prednisone, use only if needed  You are being referred to an allergist for eval and management due to recurrent episodes of severe  Dermatitis and swelling of the lipsCongrats on weight loss, keep it up with  Great health habits    Shingrix #2 today  CBC, fasting lipid, cmp and EGFr, vit D , tSH in 5.5 monthsa  It is important that you exercise regularly at least 30 minutes 5 times a week. If you develop chest pain, have severe difficulty breathing, or feel very tired, stop exercising immediately and seek medical attention   Think about what you will eat, plan ahead. Choose " clean, green, fresh or frozen" over canned, processed or packaged foods which are more sugary, salty and fatty. 70 to 75% of food eaten should be vegetables and fruit. Three meals at set times with snacks allowed between meals, but they must be fruit or vegetables. Aim to eat over a 12 hour period , example 7 am to 7 pm, and STOP after  your last meal of the day. Drink water,generally about 64 ounces per day, no other drink is as healthy. Fruit juice is best enjoyed in a healthy way, by EATING the fruit.   Social distancing. Frequent hand washing with soap and water Keeping your hands off of your face.wear a face mask when outside These 3 practices will help to keep both you and your community healthy during this time. Please practice them faithfully!  Thanks for choosing [facility], we consider it a privelige to serve you.

## 2019-01-06 NOTE — Assessment & Plan Note (Addendum)
Improved, however needs allergy ecal due to recurrent episodes of allergic dermatitis, most recent involving perioral area, placing her at increasing risk of potential airway compromise Repeat course of short term prednisone and pepcid provided in the event of recurrence

## 2019-01-06 NOTE — Assessment & Plan Note (Signed)
After obtaining informed consent, the vaccine is  administered , with no adverse effect noted at the time of administration.  

## 2019-01-06 NOTE — Assessment & Plan Note (Signed)
Improved  Patient re-educated about  the importance of commitment to a  minimum of 150 minutes of exercise per week as able.  The importance of healthy food choices with portion control discussed, as well as eating regularly and within a 12 hour window most days. The need to choose "clean , green" food 50 to 75% of the time is discussed, as well as to make water the primary drink and set a goal of 64 ounces water daily.  Encouraged to start a food diary,  and to consider  joining a support group. Sample diet sheets offered. Goals set by the patient for the next several months.   Weight /BMI 01/03/2019 12/14/2018 07/20/2018  WEIGHT 240 lb 1.3 oz - -  HEIGHT 5\' 5"  5\' 5"  5\' 5"   BMI 39.95 kg/m2 43.6 kg/m2 43.6 kg/m2

## 2019-01-06 NOTE — Assessment & Plan Note (Signed)
Controlled, no change in medication DASH diet and commitment to daily physical activity for a minimum of 30 minutes discussed and encouraged, as a part of hypertension management. The importance of attaining a healthy weight is also discussed.  BP/Weight 01/03/2019 12/14/2018 07/20/2018 02/15/2018 01/06/2018 12/18/5679 10/20/5168  Systolic BP 017 - 494 496 759 163 846  Diastolic BP 74 - 70 78 72 82 70  Wt. (Lbs) 240.08 - - 262 266 - 298.8  BMI 39.95 43.6 43.6 43.6 44.26 - 49.72

## 2019-01-06 NOTE — Progress Notes (Signed)
Stefanie Taylor     MRN: [id number]      DOB: 1955-09-24   HPI Stefanie Taylor is here for follow up and re-evaluation of chronic medical conditions, medication management and review of any available recent lab and radiology data.  Preventive health is updated, specifically  Cancer screening and Immunization.  Needs shingrix #2 today Questions or concerns regarding consultations or procedures which the PT has had in the interim are  addressed. Recently treated twice for severe allergic reaction, primarily involving the face and mouth. She reports  Multiple drug allergies, and has over the years experienced skin manifestation of allergic reactions, no report of airway compromise or wheezing, however , on this most recent episode , her report is of significant periorbital swelling with no new use of inciting agent whether  Known food, skin care product or cosmetic States her exercise routine for weight loss has recently been railroaded because of stay at home order, still controlling eating and hopes to lose an additional 50 pounds  ROS See HPI  Denies recent fever or chills. Denies sinus pressure, nasal congestion, ear pain or sore throat. Denies chest congestion, productive cough or wheezing. Denies chest pains, palpitations and leg swelling Denies abdominal pain, nausea, vomiting,diarrhea or constipation.   Denies dysuria, frequency, hesitancy or incontinence. Denies joint pain, swelling and limitation in mobility. Denies headaches, seizures, numbness, or tingling. Denies depression, anxiety or insomnia.   PE  BP 118/74   Pulse (!) 57   Resp 15   Ht 5\' 5"  (1.651 m)   Wt 240 lb 1.3 oz (108.9 kg)   SpO2 99%   BMI 39.95 kg/m   Patient alert and oriented and in no cardiopulmonary distress.  HEENT: No facial asymmetry, EOMI,   oropharynx pink and moist.  Neck supple no JVD, no mass.mild swelling of lips, no skin breakdown noted on mucosa of mouth where pt reports using trip  antibiotic oint topically, I advised her to d/c same and that this is for external use only  Chest: Clear to auscultation bilaterally.  CVS: S1, S2 no murmurs, no S3.Regular rate.  ABD: Soft non tender.   Ext: No edema  MS: Adequate ROM spine, shoulders, hips and knees.  Skin: Intact, no ulcerations or rash noted.  Psych: Good eye contact, normal affect. Memory intact not anxious or depressed appearing.  CNS: CN 2-12 intact, power,  normal throughout.no focal deficits noted.   Assessment & Plan  HTN (hypertension) Controlled, no change in medication DASH diet and commitment to daily physical activity for a minimum of 30 minutes discussed and encouraged, as a part of hypertension management. The importance of attaining a healthy weight is also discussed.  BP/Weight 01/03/2019 12/14/2018 07/20/2018 02/15/2018 01/06/2018 02/12/9508 11/13/6710  Systolic BP 458 - 099 833 825 053 976  Diastolic BP 74 - 70 78 72 82 70  Wt. (Lbs) 240.08 - - 262 266 - 298.8  BMI 39.95 43.6 43.6 43.6 44.26 - 49.72       Need for shingles vaccine After obtaining informed consent, the vaccine is  administered , with no adverse effect noted at the time of administration.   Morbid obesity, Weight - 324, BMI - 53.9. Improved  Patient re-educated about  the importance of commitment to a  minimum of 150 minutes of exercise per week as able.  The importance of healthy food choices with portion control discussed, as well as eating regularly and within a 12 hour window most days. The need to choose "clean ,  green" food 50 to 75% of the time is discussed, as well as to make water the primary drink and set a goal of 64 ounces water daily.  Encouraged to start a food diary,  and to consider  joining a support group. Sample diet sheets offered. Goals set by the patient for the next several months.   Weight /BMI 01/03/2019 12/14/2018 07/20/2018  WEIGHT 240 lb 1.3 oz - -  HEIGHT 5\' 5"  5\' 5"  5\' 5"   BMI 39.95 kg/m2 43.6  kg/m2 43.6 kg/m2      Periorbital dermatitis Improved, however needs allergy ecal due to recurrent episodes of allergic dermatitis, most recent involving perioral area, placing her at increasing risk of potential airway compromise Repeat course of short term prednisone and pepcid provided in the event of recurrence

## 2019-01-24 ENCOUNTER — Other Ambulatory Visit: Payer: Self-pay

## 2019-01-24 ENCOUNTER — Ambulatory Visit: Payer: BC Managed Care – PPO | Admitting: Allergy

## 2019-01-24 ENCOUNTER — Ambulatory Visit: Payer: BC Managed Care – PPO | Admitting: Allergy & Immunology

## 2019-01-24 ENCOUNTER — Encounter: Payer: Self-pay | Admitting: Allergy & Immunology

## 2019-01-24 VITALS — BP 112/72 | HR 57 | Resp 16 | Ht 63.0 in | Wt 241.4 lb

## 2019-01-24 DIAGNOSIS — L2089 Other atopic dermatitis: Secondary | ICD-10-CM

## 2019-01-24 DIAGNOSIS — T783XXD Angioneurotic edema, subsequent encounter: Secondary | ICD-10-CM | POA: Diagnosis not present

## 2019-01-24 MED ORDER — PREDNISONE 10 MG PO TABS: ORAL_TABLET | ORAL | 1 refills | Status: DC

## 2019-01-24 MED ORDER — FAMOTIDINE 20 MG PO TABS: 20.0000 mg | ORAL_TABLET | Freq: Two times a day (BID) | ORAL | 5 refills | Status: DC

## 2019-01-24 MED ORDER — CETIRIZINE HCL 10 MG PO TABS: 10.0000 mg | ORAL_TABLET | Freq: Two times a day (BID) | ORAL | 5 refills | Status: DC

## 2019-01-24 MED ORDER — EPINEPHRINE 0.3 MG/0.3ML IJ SOAJ: 0.3000 mg | INTRAMUSCULAR | 2 refills | Status: DC | PRN

## 2019-01-24 NOTE — Patient Instructions (Addendum)
1. Angioedema, subsequent encounter - Your history does not have any "red flags" such as fevers, joint pains, or permanent skin changes that would be concerning for a more serious cause of swelling.  - We will get some labs to rule out serious causes of swelling: alpha gal panel, complete blood count, tryptase level, chronic urticaria panel, CMP, ANA, ESR, and CRP. - Since the histamine was non-reactive today, we are going to get an environmental allergy panel and the basic food panel (this includes the most common causes of food allergies). - We will call you in 1-2 weeks with the results of the testing.  - We are going to send in a prescription for AuviQ (epinephrine), which will be delivered directly to your home.  - Chronic hives are often times a self limited process and will "burn themselves out" over 6-12 months, although this is not always the case.  - In the meantime, start suppressive dosing of antihistamines:   - Morning: Zyrtec (cetirizine) 10-80m (one or two tablets) + Pepcid (famotidine) 225m - Evening: Zyrtec (cetirizine) 10-2067mone or two tablets) + Pepcid (famotidine) 53m47mYou can change this dosing at home, decreasing the dose as needed or increasing the dosing as needed. - Start the prednisone wean: Take two tablets (53mg32mice daily for three days, then one tablet (10mg)73mce daily for three days, then STOP.  - If you are not tolerating the medications or are tired of taking them every day, we can start treatment with a monthly injectable medication called Xolair.   2. Return in about 2 months (around 03/26/2019). This can be an in-person, a virtual Webex or a telephone follow up visit.   Please inform us of [facility] visits, hospitalizations, or changes in symptoms. Call us befKoreae going to the ED for breathing or allergy symptoms since we might be able to fit you in for a sick visit. Feel free to contact us anyKoreame with any questions, problems, or concerns.   It was a pleasure to meet you today!  Websites that have reliable patient information: 1. American Academy of Asthma, Allergy, and Immunology: www.aaaai.org 2. Food Allergy Research and Education (FARE): foodallergy.org 3. Mothers of Asthmatics: http://www.asthmacommunitynetwork.org 4. American College of Allergy, Asthma, and Immunology: www.acaai.org  "Like" us on [place] and Instagram for our latest updates!      Make sure you are registered to vote! If you have moved or changed any of your contact information, you will need to get this updated before voting!    Voter ID laws are NOT going into effect for the General Election in November 2020! DO NOT let this stop you from exercising your right to vote!

## 2019-01-24 NOTE — Progress Notes (Signed)
NEW PATIENT  Date of Service/Encounter:  01/24/19  Referring provider: Fayrene Helper, MD   Assessment:   Angioedema - isolated to the lips/face with no systemic reactions whatsoever, unknown trigger  Flexural atopic dermatitis  Plan/Recommendations:   1. Angioedema and lip swelling - Your history does not have any "red flags" such as fevers, joint pains, or permanent skin changes that would be concerning for a more serious cause of swelling.  - We will get some labs to rule out serious causes of swelling: alpha gal panel, complete blood count, tryptase level, chronic urticaria panel, CMP, ANA, ESR, and CRP. - Since the histamine was non-reactive today, we are going to get an environmental allergy panel and the basic food panel (this includes the most common causes of food allergies). - We will call you in 1-2 weeks with the results of the testing.  - We are going to send in a prescription for AuviQ (epinephrine), which will be delivered directly to your home.  - Chronic hives are often times a self limited process and will "burn themselves out" over 6-12 months, although this is not always the case.  - In the meantime, start suppressive dosing of antihistamines:   - Morning: Zyrtec (cetirizine) 10-58m (one or two tablets) + Pepcid (famotidine) 253m - Evening: Zyrtec (cetirizine) 10-2049mone or two tablets) + Pepcid (famotidine) 39m50mYou can change this dosing at home, decreasing the dose as needed or increasing the dosing as needed. - Start the prednisone wean: Take two tablets (39mg77mice daily for three days, then one tablet (10mg)59mce daily for three days, then STOP.  - If you are not tolerating the medications or are tired of taking them every day, we can start treatment with a monthly injectable medication called Xolair.   2. Eczema - Continue with moisturizing as you are doing now. - Continue with the compounded ointment twice daily as needed.  - Consider  starting Dupixent in the future if needed since this can treat urticaria/angioedema as well, although it is not approved to do so at this time.   3. Return in about 2 months (around 03/26/2019). This can be an in-person, a virtual Webex or a telephone follow up visit.   Subjective:   [name]63 y.o55female presenting today for evaluation of  Chief Complaint  Patient presents with  . Angioedema    lips  . Allergic Rhinitis     EuniceKrishna Taylor history of the following: Patient Active Problem List   Diagnosis Date Noted  . Poor urinary stream 07/20/2018  . Need for shingles vaccine 07/20/2018  . Osteoarthritis 03/13/2015  . Allergic rhinitis 08/24/2013  . Morbid obesity, Weight - 324, BMI - 53.9. 08/02/2012  . 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 HTN (hypertension) 02/23/2012    History obtained from: chart review and patient.  EuniceArmya Westerhoffeferred by SimpsoFayrene Helper    [name]63 y.o10female presenting for an evaluation of lip swelling. She has had three episodes in total.    The first episode occurred in 2012. She was actually arguing a case in court and because of her lip swelling and facial swelling, the judge sent her home. She must have looked terrible, as the judge actually checked with the county to see if there were any domestic abuse allegations against her. The patient went to see her primary care provider and was given prednisone with resolution of the symptoms.  She does not remember what time of the year this occurred. She denies any other systemic symptoms, including hives over the rest of her body, stomach pain, or wheezing.  She did fine for eight years until around one week before Easter this year. At that time, she developed a tingling sensation in her lips as well as a stretching sensation in her lips. Her lips eventually swelled as well as part of her face. Again, she had no systemic symptoms concerning for  anaphylaxis. She was treated with prednisone with resolution of her symptoms over a couple of days. Then two weeks ago she had a similar episode and was started on prednisone (56m BID for five days). She did have some improvement in the swelling but she continues to have marked swelling even today. She was started on cetirizine 10 mg daily, which has not seem to have suppress the episodes. Her last dose of cetirizine was one day ago. She is also on famotidine 240mBID and hydroxyzine PRN.   She denies any exposure to any particular foods. She is a wide variety of foods without adverse event. She is not sure when the first episode occurred, but she thinks that her most recent episodes might be related to exposure to pollens. She does not wear cosmetics routinely. Prior to the coronavirus pandemic, she would use lipstick fairly frequently. She has not worn it in two months, however. She does have a menthol containing pain ointment which she uses when she rides her bike, but this exposure is not always associated with her episodes of lip swelling and obviously the pain ointment is for her joints and not used on her lips.   She also has a history of eczema. She first started developing problems with eczema in [place] She apparently went to several specialist before it was diagnosed as run-of-the-mill eczema. She had another flare of her eczema [place] always on her [place], particularly under her bra line.    She has absolutely no history of allergic rhinitis or asthma. She does not have any food allergies to her knowledge. Otherwise, there is no history of other atopic diseases, including asthma, food allergies, drug allergies, environmental allergies, stinging insect allergies or contact dermatitis. There is no significant infectious history. Vaccinations are up to date.     Past Medical History: Patient Active Problem List   Diagnosis Date Noted  . Poor urinary stream 07/20/2018  . Need for shingles vaccine 07/20/2018  . Osteoarthritis 03/13/2015  . Allergic rhinitis 08/24/2013  . Morbid obesity, Weight - 324, BMI - 53.9. 08/02/2012  . 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 HTN (hypertension) 02/23/2012    Medication List:  Allergies as of 01/24/2019      Reactions   Sulfur    Lasix [furosemide] Itching, Swelling   Sulfamethoxazole Itching   Ibuprofen Itching, Rash      Medication List       Accurate as of Jan 24, 2019  1:16 PM. If you have any questions, ask your nurse or doctor.        STOP taking these medications   oyster calcium 500 MG Tabs tablet Stopped by:  JoValentina ShaggyMD     TAKE these medications   CALCIUM PO Take by mouth.   cetirizine 10 MG tablet Commonly known as:  ZYRTEC Take 1 tablet (10 mg  total) by mouth 2 (two) times a day. What changed:  when to take this Changed by:  Valentina Shaggy, MD   EPINEPHrine 0.3 mg/0.3 mL Soaj injection Commonly known as:  Auvi-Q Inject 0.3 mLs (0.3 mg total) into the muscle as needed for anaphylaxis. Started by:  Valentina Shaggy, MD   famotidine 20 MG tablet Commonly known as:  Pepcid Take 1 tablet (20 mg total) by mouth 2 (two) times daily. What changed:  when to take this Changed by:  Valentina Shaggy, MD   hydrOXYzine 25 MG tablet Commonly known as:  ATARAX/VISTARIL Take 25 mg by mouth 3 (three) times daily as needed.   Iron 142 (45 Fe) MG Tbcr Take by mouth. 1 daily   minoxidil 2.5 MG tablet Commonly known as:  LONITEN Take 1 tablet (2.5 mg total) by mouth daily.   predniSONE 10 MG tablet Commonly known as:  DELTASONE Take two tablets (84m) twice daily for three days, then one tablet (132m twice daily for three days, then STOP. What changed:    how much to take  how to take this  when to take this  additional instructions Changed by:   JoValentina ShaggyMD   Restasis 0.05 % ophthalmic emulsion Generic drug:  cycloSPORINE INT 1 GTT IN OU BID   vitamin B-12 500 MCG tablet Commonly known as:  CYANOCOBALAMIN Take 500 mcg by mouth daily.       Birth History: non-contributory  Developmental History: non-contributory  Past Surgical History: Past Surgical History:  Procedure Laterality Date  . BIOPSY BREAST    . BREAST BIOPSY    . BREAST EXCISIONAL BIOPSY Right   . BREAST SURGERY  1998   biopsy mole on right breast  . BREATH TEK H PYLORI  09/04/2012   Procedure: BREATH TEK H PYLORI;  Surgeon: DaShann MedalMD;  [facility];  Service: General;  Laterality: N/A;  . BREATH TEK H PYLORI N/A 11/06/2012   Procedure: BRLauris Chroman Surgeon: DaShann MedalMD;  [facility];  Service: General;  Laterality: N/A;  . COLONOSCOPY WITH PROPOFOL N/A 05/28/2016   Procedure: COLONOSCOPY WITH PROPOFOL;  Surgeon: PaCarol AdaMD;  Location: [facility];  Service: Endoscopy;  Laterality: N/A;  . GASTRIC BYPASS  09/07/2017  . TUBAL LIGATION    . UMBILICAL HERNIA REPAIR     age [age]   Family History: Family History  Problem Relation Age of Onset  . Hypertension Mother   . Heart disease Mother   . Cancer Mother   . Hypertension Father   . Hypertension Sister   . Alcohol abuse Sister   . Diabetes Brother   . Hypertension Brother   . Diabetes Sister   . Hypertension Sister   . Diabetes Sister   . Hypertension Sister   . Diabetes Brother   . Hypertension Brother   . Diabetes Brother   . Hypertension Brother   . Diabetes Brother   . Hypertension Brother   . Hypertension Brother   . Breast cancer Neg Hx   . Allergic rhinitis Neg Hx   . Asthma Neg Hx   . Eczema Neg Hx   . Urticaria Neg Hx      Social History: [place] at home with her family. 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 [place] There are  animals at home. She does not  smoke at all.    Review of Systems  Constitutional: Negative.  Negative for chills, fever, malaise/fatigue and weight loss.  HENT: Negative.  Negative for congestion, ear discharge, ear pain and sore throat.   Eyes: Negative for pain, discharge and redness.  Respiratory: Negative for cough, sputum production, shortness of breath and wheezing.   Cardiovascular: Negative.  Negative for chest pain and palpitations.  Gastrointestinal: Negative for abdominal pain, heartburn, nausea and vomiting.  Skin: Positive for itching and rash.  Neurological: Negative for dizziness and headaches.  Endo/Heme/Allergies: Negative for environmental allergies. Does not bruise/bleed easily.       Objective:   Blood pressure 112/72, pulse (!) 57, resp. rate 16, height _0  (1.6 m), weight 241 lb 6.4 oz (109.5 kg), SpO2 98 %. Body mass index is 42.76 kg/m.   Physical Exam:   Physical Exam  Constitutional: She appears well-developed.  Pleasant female.  HENT:  Head: Normocephalic and atraumatic.  Right Ear: Tympanic membrane, external ear and ear canal normal. No drainage, swelling or tenderness. Tympanic membrane is not injected, not scarred, not erythematous, not retracted and not bulging.  Left Ear: Tympanic membrane, external ear and ear canal normal. No drainage, swelling or tenderness. Tympanic membrane is not injected, not scarred, not erythematous, not retracted and not bulging.  Nose: Mucosal edema and rhinorrhea present. No nasal deformity or septal deviation. No epistaxis. Right sinus exhibits no maxillary sinus tenderness and no frontal sinus tenderness. Left sinus exhibits no maxillary sinus tenderness and no frontal sinus tenderness.  Mouth/Throat: Uvula is midline and oropharynx is clear and moist. Mucous membranes are not pale and not dry.  She does have swollen lips and enlarged cheeks bilaterally.  Her oropharynx is clearly visualized without any swelling  noted.  She is not in distress.  Cobblestoning present in the posterior oropharynx.  Eyes: Pupils are equal, round, and reactive to light. Conjunctivae and EOM are normal. Right eye exhibits no chemosis and no discharge. Left eye exhibits no chemosis and no discharge. Right conjunctiva is not injected. Left conjunctiva is not injected.  Cardiovascular: Normal rate, regular rhythm and normal heart sounds.  Respiratory: Effort normal and breath sounds normal. No accessory muscle usage. No tachypnea. No respiratory distress. She has no wheezes. She has no rhonchi. She has no rales. She exhibits no tenderness.  No wheezing or crackles noted.  GI: There is no abdominal tenderness. There is no rebound and no guarding.  Lymphadenopathy:       Head (right side): No submandibular, no tonsillar and no occipital adenopathy present.       Head (left side): No submandibular, no tonsillar and no occipital adenopathy present.    She has no cervical adenopathy.  Neurological: She is alert.  Skin: No abrasion, no petechiae and no rash noted. Rash is not papular, not vesicular and not urticarial. No erythema. No pallor.  No eczematous lesions noted.  She does have 1 raised papular region under her bra strap on her back.  Psychiatric: She has a normal mood and affect.     Diagnostic studies: deferred due to recent antihistamine use     Salvatore Marvel, MD [facility]

## 2019-01-30 ENCOUNTER — Other Ambulatory Visit: Payer: Self-pay | Admitting: Family Medicine

## 2019-01-30 DIAGNOSIS — Z1231 Encounter for screening mammogram for malignant neoplasm of breast: Secondary | ICD-10-CM

## 2019-01-31 LAB — ALPHA-GAL PANEL
Alpha Gal IgE*: <0.1 kU/L (ref <0.10)
Beef (Bos spp) IgE: <0.1 kU/L (ref <0.35)
Class Interpretation: 0
Class Interpretation: 0
Class Interpretation: 0
Lamb/Mutton (Ovis spp) IgE: <0.1 kU/L (ref <0.35)
Pork (Sus spp) IgE: <0.1 kU/L (ref <0.35)

## 2019-01-31 LAB — IGE+ALLERGENS ZONE 2(30)
Alternaria Alternata IgE: <0.1 kU/L
Amer Sycamore IgE Qn: <0.1 kU/L
Aspergillus Fumigatus IgE: <0.1 kU/L
Bahia Grass IgE: <0.1 kU/L
Bermuda Grass IgE: <0.1 kU/L
Cat Dander IgE: <0.1 kU/L
Cedar, Mountain IgE: <0.1 kU/L
Cladosporium Herbarum IgE: <0.1 kU/L
Cockroach, American IgE: <0.1 kU/L
Common Silver Birch IgE: <0.1 kU/L
D Farinae IgE: <0.1 kU/L
D Pteronyssinus IgE: <0.1 kU/L
Dog Dander IgE: <0.1 kU/L
Elm, American IgE: <0.1 kU/L
Hickory, White IgE: <0.1 kU/L
IgE (Immunoglobulin E), Serum: 30 IU/mL (ref 6–495)
Johnson Grass IgE: <0.1 kU/L
Maple/Box Elder IgE: <0.1 kU/L
Mucor Racemosus IgE: <0.1 kU/L
Mugwort IgE Qn: <0.1 kU/L
Nettle IgE: <0.1 kU/L
Oak, White IgE: <0.1 kU/L
Penicillium Chrysogen IgE: <0.1 kU/L
Pigweed, Rough IgE: <0.1 kU/L
Plantain, English IgE: <0.1 kU/L
Ragweed, Short IgE: <0.1 kU/L
Sheep Sorrel IgE Qn: <0.1 kU/L
Stemphylium Herbarum IgE: <0.1 kU/L
Sweet gum IgE RAST Ql: <0.1 kU/L
Timothy Grass IgE: 0.27 kU/L — AB
White Mulberry IgE: <0.1 kU/L

## 2019-01-31 LAB — CBC WITH DIFFERENTIAL/PLATELET
Basophils Absolute: 0 10*3/uL (ref 0.0–0.2)
Basos: 1 %
EOS (ABSOLUTE): 0.1 10*3/uL (ref 0.0–0.4)
Eos: 4 %
Hematocrit: 39.1 % (ref 34.0–46.6)
Hemoglobin: 13.3 g/dL (ref 11.1–15.9)
Immature Grans (Abs): 0 10*3/uL (ref 0.0–0.1)
Immature Granulocytes: 0 %
Lymphocytes Absolute: 1.3 10*3/uL (ref 0.7–3.1)
Lymphs: 41 %
MCH: 29.6 pg (ref 26.6–33.0)
MCHC: 34 g/dL (ref 31.5–35.7)
MCV: 87 fL (ref 79–97)
Monocytes Absolute: 0.4 10*3/uL (ref 0.1–0.9)
Monocytes: 12 %
Neutrophils Absolute: 1.4 10*3/uL (ref 1.4–7.0)
Neutrophils: 42 %
Platelets: 198 10*3/uL (ref 150–450)
RBC: 4.49 x10E6/uL (ref 3.77–5.28)
RDW: 13 % (ref 11.7–15.4)
WBC: 3.2 10*3/uL — ABNORMAL LOW (ref 3.4–10.8)

## 2019-01-31 LAB — CMP14+EGFR
ALT: 23 IU/L (ref 0–32)
AST: 28 IU/L (ref 0–40)
Albumin/Globulin Ratio: 1.8 (ref 1.2–2.2)
Albumin: 4.2 g/dL (ref 3.8–4.8)
Alkaline Phosphatase: 76 IU/L (ref 39–117)
BUN/Creatinine Ratio: 10 — ABNORMAL LOW (ref 12–28)
BUN: 7 mg/dL — ABNORMAL LOW (ref 8–27)
Bilirubin Total: 0.9 mg/dL (ref 0.0–1.2)
CO2: 23 mmol/L (ref 20–29)
Calcium: 9.5 mg/dL (ref 8.7–10.3)
Chloride: 105 mmol/L (ref 96–106)
Creatinine, Ser: 0.72 mg/dL (ref 0.57–1.00)
GFR calc Af Amer: 102 mL/min/{1.73_m2} (ref >59)
GFR calc non Af Amer: 89 mL/min/{1.73_m2} (ref >59)
Globulin, Total: 2.4 g/dL (ref 1.5–4.5)
Glucose: 89 mg/dL (ref 65–99)
Potassium: 4.3 mmol/L (ref 3.5–5.2)
Sodium: 143 mmol/L (ref 134–144)
Total Protein: 6.6 g/dL (ref 6.0–8.5)

## 2019-01-31 LAB — ANA W/REFLEX IF POSITIVE: Anti Nuclear Antibody (ANA): NEGATIVE

## 2019-01-31 LAB — SEDIMENTATION RATE: Sed Rate: 10 mm/hr (ref 0–40)

## 2019-01-31 LAB — TRYPTASE: Tryptase: 4.6 ug/L (ref 2.2–13.2)

## 2019-01-31 LAB — ALLERGEN PROFILE, BASIC FOOD
Allergen Corn, IgE: <0.1 kU/L
Beef IgE: <0.1 kU/L
Chocolate/Cacao IgE: <0.1 kU/L
Egg, Whole IgE: <0.1 kU/L
Food Mix (Seafoods) IgE: NEGATIVE
Milk IgE: 0.23 kU/L — AB
Peanut IgE: <0.1 kU/L
Pork IgE: <0.1 kU/L
Soybean IgE: <0.1 kU/L
Wheat IgE: <0.1 kU/L

## 2019-01-31 LAB — CHRONIC URTICARIA: cu index: 6.3 (ref <10)

## 2019-01-31 LAB — C-REACTIVE PROTEIN: CRP: <1 mg/L (ref 0–10)

## 2019-02-01 ENCOUNTER — Encounter: Payer: Self-pay | Admitting: Allergy & Immunology

## 2019-02-01 DIAGNOSIS — T783XXD Angioneurotic edema, subsequent encounter: Secondary | ICD-10-CM

## 2019-02-01 MED ORDER — MONTELUKAST SODIUM 10 MG PO TABS: 10.0000 mg | ORAL_TABLET | Freq: Every day | ORAL | 5 refills | Status: DC

## 2019-02-02 ENCOUNTER — Encounter: Payer: Self-pay | Admitting: Allergy & Immunology

## 2019-02-02 ENCOUNTER — Ambulatory Visit: Payer: BC Managed Care – PPO | Admitting: Allergy & Immunology

## 2019-02-02 ENCOUNTER — Other Ambulatory Visit: Payer: Self-pay | Admitting: Allergy & Immunology

## 2019-02-02 ENCOUNTER — Other Ambulatory Visit: Payer: Self-pay

## 2019-02-02 VITALS — BP 112/60 | HR 70 | Temp 97.3°F | Resp 16 | Ht 63.0 in

## 2019-02-02 DIAGNOSIS — L501 Idiopathic urticaria: Secondary | ICD-10-CM | POA: Diagnosis not present

## 2019-02-02 DIAGNOSIS — T783XXD Angioneurotic edema, subsequent encounter: Secondary | ICD-10-CM | POA: Diagnosis not present

## 2019-02-02 MED ORDER — OMALIZUMAB 150 MG ~~LOC~~ SOLR
150.0000 mg | SUBCUTANEOUS | Status: DC
  Administered 2019-02-02: 150 mg via SUBCUTANEOUS

## 2019-02-02 NOTE — Addendum Note (Signed)
Addended by: Valentina Shaggy on: 02/02/2019 06:31 AM   Modules accepted: Orders

## 2019-02-02 NOTE — Progress Notes (Signed)
FOLLOW UP  Date of Service/Encounter:  02/02/19   Assessment:   Angioedema - isolated to the lips/face with no systemic reactions whatsoever, unknown trigger  Initiating Xolair today (sample provided)  Flexural atopic dermatitis  Plan/Recommendations:   1. Angioedema - unknown trigger with lab workup that has been normal thus far - Labs ordered for a repeat complete blood count as well as a seafood panel and hereditary angioedema labs. - Try to get these labs today since the Xolair can interfere with the seafood testing.  - We are submitting for initiation of Xolair to help with the episodes of hives and swelling. - Xolair is essentially a sponge that binds up all of your allergy antibodies and prevents episodes of the hives and swelling. - This medication is well tolerated, but there is a small risk of anaphylaxis, which is why we make sure that you have an epinephrine autoinjector and we make you wait after your injections.  - Sample of Xolair provided today.  - In the meantime, continue with suppressive dosing of antihistamines:   - Morning: Zyrtec (cetirizine) 10-38m (one or two tablets) + Pepcid (famotidine) 261m - Evening: Zyrtec (cetirizine) 10-2018mone or two tablets) + Pepcid (famotidine) 62m31mSingulair (montelukast) 10mg61mYou can change this dosing at home, decreasing the dose as needed or increasing the dosing as needed. - Our goal is to get you off of these medications and hopefully the Xolair can help with this.  - Check on the [facility] their number. - I did confirm that the prescription was sent to their specialty pharmacy.  - Anaphylaxis management plan provided today and instructions on how to use AuviQ provided in detail.   2. Return in about 4 weeks (around 03/02/2019). This can be an in-person, a virtual Webex or a telephone follow up visit.     Subjective:   Stefanie Taylor 63 y.21 female presenting today for follow up of  Chief  Complaint  Patient presents with  . Facial Swelling    started in lips moved to left eye and now in Rt eye     Stefanie Taylor history of the following: Patient Active Problem List   Diagnosis Date Noted  . Poor urinary stream 07/20/2018  . Need for shingles vaccine 07/20/2018  . Osteoarthritis 03/13/2015  . Allergic rhinitis 08/24/2013  . Morbid obesity, Weight - 324, BMI - 53.9. 08/02/2012  . 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 HTN (hypertension) 02/23/2012    History obtained from: chart review and patient.  Stefanie 63 y.2 female presenting for a sick visit.  She was last seen in earlier this month for an evaluation of angioedema.  This was isolated to the lips and face with no systemic reactions.  We did an extensive lab work-up including an alpha gal panel, CBC, tryptase, chronic urticaria panel, metabolic panel, ANA, ESR, and CRP as well as an environmental allergen panel and a basic food panel.  This was largely normal.  Her CBC did show a low white blood count of 3.2.  Her environmental allergy panel was positive only to Timothy grass.  She had a milk IgE of 0.23.  We started her on suppressive doses of antihistamines and gave her a prednisone burst out of control her symptoms more acutely.    Yesterday she called and reported breakthrough symptoms with pressure to salmon as well as shrimp.  We ordered a seafood panel as well as a work-up  for hereditary angioedema.  We also ordered a repeat complete blood count to make sure that her white blood count was normalizing.  She presented today for a sick visit.  She reports that she has had bilateral eye swelling over the last several days.  She has been dousing her eyes with over-the-counter lubricant eyedrops to help with the itching.  She has also taken 2 Benadryl just today.  The swelling has started to resolve.  She is interested in starting a different medication for more long-term control.  I did send her message about  Xolair and she has not looked into this yet.  We did spend some time discussing this today.  She is interested in getting a sample today.  Otherwise, there have been no changes to her past medical history, surgical history, family history, or social history.    Review of Systems  Constitutional: Negative.  Negative for chills, fever, malaise/fatigue and weight loss.  HENT: Negative.  Negative for congestion, ear discharge and ear pain.   Eyes: Negative for pain, discharge and redness.  Respiratory: Negative for cough, sputum production, shortness of breath and wheezing.   Cardiovascular: Negative.  Negative for chest pain and palpitations.  Gastrointestinal: Negative for abdominal pain, heartburn, nausea and vomiting.  Skin: Negative.  Negative for itching and rash.  Neurological: Negative for dizziness and headaches.  Endo/Heme/Allergies: Negative for environmental allergies. Does not bruise/bleed easily.       Objective:   Blood pressure 112/60, pulse 70, temperature (!) 97.3 F (36.3 C), temperature source Temporal, resp. rate 16, height _0  (1.6 m), SpO2 98 %. Body mass index is 42.76 kg/m.   Physical Exam:  Physical Exam  Constitutional: She appears well-developed.  Pleasant female as always.   HENT:  Head: Normocephalic and atraumatic.  Right Ear: Tympanic membrane, external ear and ear canal normal.  Left Ear: Tympanic membrane, external ear and ear canal normal.  Nose: Mucosal edema present. No rhinorrhea, nasal deformity or septal deviation. No epistaxis. Right sinus exhibits no maxillary sinus tenderness and no frontal sinus tenderness. Left sinus exhibits no maxillary sinus tenderness and no frontal sinus tenderness.  Mouth/Throat: Uvula is midline and oropharynx is clear and moist. Mucous membranes are not pale and not dry.  Eyes: Pupils are equal, round, and reactive to light. Conjunctivae and EOM are normal. Right eye exhibits no chemosis and no discharge. Left  eye exhibits no chemosis and no discharge. Right conjunctiva is not injected. Left conjunctiva is not injected.  Bilateral periorbital edema present. There is some tearing present, but this is from the lubricant eye drops that she has put into her eyes today.   Cardiovascular: Normal rate, regular rhythm and normal heart sounds.  Respiratory: Effort normal and breath sounds normal. No accessory muscle usage. No tachypnea. No respiratory distress. She has no wheezes. She has no rhonchi. She has no rales. She exhibits no tenderness.  Lymphadenopathy:    She has no cervical adenopathy.  Neurological: She is alert.  Skin: No abrasion, no petechiae and no rash noted. Rash is not papular, not vesicular and not urticarial. No erythema. No pallor.  There are some exocirations present  Psychiatric: She has a normal mood and affect.     Diagnostic studies: none  Ms. Almanzar received 132m Xolair in the clinic. She was monitored for 30 minutes post injection and tolerated this well.         JSalvatore Marvel MD  [facility]

## 2019-02-02 NOTE — Patient Instructions (Addendum)
1. Angioedema - unknown trigger with lab workup that has been normal thus far - Labs ordered for a repeat complete blood count as well as a seafood panel and hereditary angioedema labs. - Try to get these labs today since the Xolair can interfere with the seafood testing.  - We are submitting for initiation of Xolair to help with the episodes of hives and swelling. - Xolair is essentially a sponge that binds up all of your allergy antibodies and prevents episodes of the hives and swelling. - This medication is well tolerated, but there is a small risk of anaphylaxis, which is why we make sure that you have an epinephrine autoinjector and we make you wait after your injections.  - Sample of Xolair provided today.  - In the meantime, continue with suppressive dosing of antihistamines:   - Morning: Zyrtec (cetirizine) 10-20mg  (one or two tablets) + Pepcid (famotidine) 20mg   - Evening: Zyrtec (cetirizine) 10-20mg  (one or two tablets) + Pepcid (famotidine) 20mg  + Singulair (montelukast) 10mg   - You can change this dosing at home, decreasing the dose as needed or increasing the dosing as needed. - Our goal is to get you off of these medications and hopefully the Xolair can help with this.  - Check on the [facility] by calling their number (below). - I did confirm that the prescription was sent to their specialty pharmacy.   2. Return in about 4 weeks (around 03/02/2019). This can be an in-person, a virtual Webex or a telephone follow up visit.   Please inform us of any Emergency Department visits, hospitalizations, or changes in symptoms. Call us before going to the ED for breathing or allergy symptoms since we might be able to fit you in for a sick visit. Feel free to contact us anytime with any questions, problems, or concerns.  It was a pleasure to see you again today! If you need to get a hold of me, call or text me at [phone].   Websites that have reliable patient information: 1. American  Academy of Asthma, Allergy, and Immunology: www.aaaai.org 2. Food Allergy Research and Education (FARE): foodallergy.org 3. Mothers of Asthmatics: http://www.asthmacommunitynetwork.org 4. American College of Allergy, Asthma, and Immunology: www.acaai.org  "Like" [place] on Facebook and Instagram for our latest updates!      Make sure you are registered to vote! If you have moved or changed any of your contact information, you will need to get this updated before voting!    Voter ID laws are NOT going into effect for the General Election in November 2020! DO NOT let this stop you from exercising your right to vote!

## 2019-02-04 LAB — ALLERGY PANEL 19, SEAFOOD GROUP
Allergen Salmon IgE: <0.1 kU/L
Catfish: <0.1 kU/L
Codfish IgE: <0.1 kU/L
F023-IgE Crab: <0.1 kU/L
F080-IgE Lobster: <0.1 kU/L
Shrimp IgE: <0.1 kU/L
Tuna: <0.1 kU/L

## 2019-02-06 ENCOUNTER — Telehealth: Payer: Self-pay | Admitting: *Deleted

## 2019-02-06 MED ORDER — OMALIZUMAB 150 MG ~~LOC~~ SOLR
300.0000 mg | SUBCUTANEOUS | Status: DC
  Administered 2019-03-02 – 2021-08-14 (×25): 300 mg via SUBCUTANEOUS

## 2019-02-06 NOTE — Telephone Encounter (Signed)
Had called and left message for patient regarding Xolair to correct dose to 300mg  and advised ins. Approved medication and I had signed her up for copay card.  We will do buy and bill with her medication and scheduled her next appt with her followup visit.  Patient had questions regarding her lab results and I did discuss the tests that were back so far.  She did have questions regarding possible sodium reaction issues and she is going to closely monitor that with her food intake for triggers and discuss with Dr Ernst Bowler at followup visit

## 2019-02-06 NOTE — Addendum Note (Signed)
Addended by: Carin Hock on: 02/06/2019 02:32 PM   Modules accepted: Orders

## 2019-02-07 LAB — C1 ESTERASE INHIBITOR, FUNCTIONAL: C1INH Functional/C1INH Total MFr SerPl: 85 %mean normal

## 2019-02-07 LAB — C1 ESTERASE INHIBITOR: C1INH SerPl-mCnc: 28 mg/dL (ref 21–39)

## 2019-02-08 ENCOUNTER — Encounter: Payer: Self-pay | Admitting: Allergy & Immunology

## 2019-02-13 ENCOUNTER — Telehealth: Payer: Self-pay

## 2019-02-13 ENCOUNTER — Telehealth: Payer: Self-pay | Admitting: *Deleted

## 2019-02-13 LAB — CBC WITH DIFFERENTIAL/PLATELET
Basophils Absolute: 0.1 10*3/uL (ref 0.0–0.2)
Basos: 1 %
EOS (ABSOLUTE): 0.1 10*3/uL (ref 0.0–0.4)
Eos: 3 %
Hematocrit: 38.5 % (ref 34.0–46.6)
Hemoglobin: 13.1 g/dL (ref 11.1–15.9)
Immature Grans (Abs): 0 10*3/uL (ref 0.0–0.1)
Immature Granulocytes: 0 %
Lymphocytes Absolute: 2.2 10*3/uL (ref 0.7–3.1)
Lymphs: 40 %
MCH: 29.8 pg (ref 26.6–33.0)
MCHC: 34 g/dL (ref 31.5–35.7)
MCV: 88 fL (ref 79–97)
Monocytes Absolute: 0.5 10*3/uL (ref 0.1–0.9)
Monocytes: 8 %
Neutrophils Absolute: 2.7 10*3/uL (ref 1.4–7.0)
Neutrophils: 48 %
Platelets: 193 10*3/uL (ref 150–450)
RBC: 4.4 x10E6/uL (ref 3.77–5.28)
RDW: 12.8 % (ref 11.7–15.4)
WBC: 5.6 10*3/uL (ref 3.4–10.8)

## 2019-02-13 LAB — COMPLEMENT COMPONENT C1Q: Complement C1Q: 12.1 mg/dL (ref 10.3–20.5)

## 2019-02-13 LAB — C3 AND C4
Complement C3, Serum: 100 mg/dL (ref 82–167)
Complement C4, Serum: 25 mg/dL (ref 14–44)

## 2019-02-13 NOTE — Telephone Encounter (Signed)
Patient called stating she thinks she is having an allergic reaction. Her face is red and swollen and the only relief she gets is from a cold cloth. Her lips were swollen last night and went down after she iced them. I asked her if she was having any trouble breathing and she stated no. Scheduled patient to come in tomorrow to be seen at 10:20am. Advised patient if her condition worsens or if she becomes short of breath, her tongue begins to swell, she is unable to talk, or her voice changes or anything that is different or unusual she should seek immediate attention at the closest ED with verbal understanding

## 2019-02-13 NOTE — Telephone Encounter (Signed)
Patient called and requested I send message to Dr Ernst Bowler. She advised her face I red and burning, lips dry and cracking. 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  She is taking all the medications as directed by MD and just finished prednisone.  She states it is so bad she went by [facility] but MD wasn't there.

## 2019-02-13 NOTE — Telephone Encounter (Signed)
Patient would like you to give her a call. I am unsure what it is about. She then wanted me to FWD her to [facility].  Thanks

## 2019-02-13 NOTE — Telephone Encounter (Signed)
noted 

## 2019-02-14 ENCOUNTER — Ambulatory Visit (INDEPENDENT_AMBULATORY_CARE_PROVIDER_SITE_OTHER): Payer: BC Managed Care – PPO | Admitting: Family Medicine

## 2019-02-14 ENCOUNTER — Encounter: Payer: Self-pay | Admitting: Family Medicine

## 2019-02-14 ENCOUNTER — Other Ambulatory Visit: Payer: Self-pay

## 2019-02-14 VITALS — BP 110/70 | HR 61 | Temp 98.3°F | Resp 15 | Ht 63.0 in

## 2019-02-14 DIAGNOSIS — T7840XA Allergy, unspecified, initial encounter: Secondary | ICD-10-CM

## 2019-02-14 DIAGNOSIS — I10 Essential (primary) hypertension: Secondary | ICD-10-CM

## 2019-02-14 MED ORDER — PREDNISONE 10 MG PO TABS: 10.0000 mg | ORAL_TABLET | Freq: Two times a day (BID) | ORAL | 0 refills | Status: DC

## 2019-02-14 MED ORDER — PREDNISONE 10 MG PO TABS: ORAL_TABLET | ORAL | 0 refills | Status: DC

## 2019-02-14 MED ORDER — METHYLPREDNISOLONE ACETATE 80 MG/ML IJ SUSP
80.0000 mg | Freq: Once | INTRAMUSCULAR | Status: AC
  Administered 2019-02-14: 80 mg via INTRAMUSCULAR

## 2019-02-14 MED ORDER — DOXYCYCLINE HYCLATE 100 MG PO TABS: 100.0000 mg | ORAL_TABLET | Freq: Two times a day (BID) | ORAL | 0 refills | Status: DC

## 2019-02-14 MED ORDER — FEXOFENADINE HCL 180 MG PO TABS: 360.0000 mg | ORAL_TABLET | Freq: Every day | ORAL | 5 refills | Status: DC

## 2019-02-14 NOTE — Progress Notes (Signed)
Stefanie Taylor     MRN: [id number]      DOB: 1955-12-24   HPI Stefanie Taylor is here for follow up and re-evaluation of recurrent severe allergic reactions, involving face and lips. , The PT denies any adverse reactions to current medications since the last visit.  Most recent flares are May 21 , then yesterday. No known trigger, swelling and redness right eye more than left , lips get swollen , new today  Rash on left neck, angles of mouths. No new foods, personal care products, cosmetics or detergent  ROS Denies recent fever or chills. Denies sinus pressure, nasal congestion, ear pain or sore throat. Denies chest congestion, productive cough or wheezing.  Denies uncontrolled  joint pain, swelling and limitation in mobility. Denies headaches, seizures, numbness, or tingling. Denies depression, anxiety or insomnia.   PE  BP 130/80   Pulse 61   Temp 98.3 F (36.8 C) (Temporal)   Resp 15   Ht 5\' 3"  (1.6 m)   SpO2 97%   BMI 42.76 kg/m   Patient alert and oriented and in no cardiopulmonary distress.  HEENT: No facial asymmetry, EOMI,   oropharynx pink and moist.  Neck supple no JVD, no mass.erythema and swelling of face and lips, less severe than 1 day prior as seen on photo pt has in her phone. No airway compromise noted  Chest: Clear to auscultation bilaterally.  CVS: S1, S2 no murmurs, no S3.Regular rate.  ABD: Soft non tender.   Ext: No edema  MS: Adequate ROM spine, shoulders, hips and knees.  Skin: Intact, no ulcerations or rash noted.  Psych: Good eye contact, normal affect. Memory intact not anxious or depressed appearing.  CNS: CN 2-12 intact, power,  normal throughout.no focal deficits noted.   Assessment & Plan  Allergic reaction Recurrent severe dermatitis with  Facial swelling and periorbital swelling, unclear etiology Depo medrol IM followed by oral prednisone dose pack. Currently being treated by allergist Reminded of symptoms of potential  airway compromise and the need to seek emergency care should this occur  HTN (hypertension) Controlled on very low dose of minoxidil, her only rx. With ongoing weight loss and lifestyle and dietary change , pt will likely no longer need medication, she is to stop minoxidil today, will re eval BP in 10 weeks DASH diet and commitment to daily physical activity for a minimum of 30 minutes discussed and encouraged, as a part of hypertension management. The importance of attaining a healthy weight is also discussed.  BP/Weight 02/14/2019 02/02/2019 01/24/2019 01/03/2019 12/14/2018 13/0/8657 04/16/6961  Systolic BP 952 841 324 401 - 027 253  Diastolic BP 70 60 72 74 - 70 78  Wt. (Lbs) - - 241.4 240.08 - - 262  BMI 42.76 42.76 42.76 39.95 43.6 43.6 43.6       Morbid obesity, Weight - 324, BMI - 53.9.   Patient re-educated about  the importance of commitment to a  minimum of 150 minutes of exercise per week as able.  The importance of healthy food choices with portion control discussed, as well as eating regularly and within a 12 hour window most days. The need to choose "clean , green" food 50 to 75% of the time is discussed, as well as to make water the primary drink and set a goal of 64 ounces water daily.  .   Weight /BMI 02/14/2019 02/02/2019 01/24/2019  WEIGHT - - 241 lb 6.4 oz  HEIGHT 5\' 3"  5\' 3"  5\' 3"   BMI 42.76 kg/m2 42.76 kg/m2 42.76 kg/m2

## 2019-02-14 NOTE — Telephone Encounter (Signed)
Called and spoke with patient and she did go see her PCP today and her PCP gave her a steroid shot and prescribed her Doxycycline. Patient will start taking Allegra 2 tablets in the afternoon to see if this helps with her symptoms. Patient did confirm that she was eating yogurt but she has since stopped and is avoiding all foods with cows milk. Prednisone and Allegra prescriptions have been sent to [facility]. Patient will call back if she needs anything.

## 2019-02-14 NOTE — Telephone Encounter (Signed)
Good evening:  I hope that you are well and staying safe.  Thank you for responding.  Yes, the "flare ups" (inflammation which causes redness, burning, itching, and swelling) started when I stopped the prednisone. I started taking Benadryl, in addition to the other meds to help, but minimum relief.  ----- Message -----

## 2019-02-14 NOTE — Addendum Note (Signed)
Addended by: Chip Boer R on: 02/14/2019 12:32 PM   Modules accepted: Orders

## 2019-02-14 NOTE — Addendum Note (Signed)
Addended by: Valentina Shaggy on: 02/14/2019 11:45 AM   Modules accepted: Orders

## 2019-02-14 NOTE — Patient Instructions (Addendum)
F/U in 10  Weeks, call if you need me before  Depo medrol 80 mg im in office today  Take prednisone 10 mg twice daily for 5 days , then only if needed  Please stop minoxidil  Keep up weight loss  Social distancing. Frequent hand washing with soap and water Keeping your hands off of your face. These 3 practices will help to keep both you and your community healthy during this time. Please practice them faithfully!   Thanks for choosing [facility], we consider it a privelige to serve you.

## 2019-02-14 NOTE — Telephone Encounter (Signed)
Please call and make sure that she is doing the following:   - Morning: Zyrtec (cetirizine) 10-20mg  (one or two tablets) + Pepcid (famotidine) 20mg  - Evening: Zyrtec (cetirizine) 10-20mg  (one or two tablets) + Pepcid (famotidine) 20mg  + Singulair (montelukast) 10mg    We can add a noon Allegra dose (two tablets) to see if this can provide some relief.  I pended another prednisone taper, but this of course is not a long term solution. I am hoping the Xolair will eventually kick in and help this.  Please see if she is avoiding cow's milk. This was positive on the labs and might be contributing to her symptoms.   Salvatore Marvel, MD [facility] [place]

## 2019-02-15 NOTE — Telephone Encounter (Signed)
Noted. Sounds good!   Salvatore Marvel, MD [facility] [place] [facility] [place]

## 2019-02-19 DIAGNOSIS — T7840XA Allergy, unspecified, initial encounter: Secondary | ICD-10-CM

## 2019-02-19 HISTORY — DX: Allergy, unspecified, initial encounter: T78.40XA

## 2019-02-19 NOTE — Assessment & Plan Note (Signed)
Controlled on very low dose of minoxidil, her only rx. With ongoing weight loss and lifestyle and dietary change , pt will likely no longer need medication, she is to stop minoxidil today, will re eval BP in 10 weeks DASH diet and commitment to daily physical activity for a minimum of 30 minutes discussed and encouraged, as a part of hypertension management. The importance of attaining a healthy weight is also discussed.  BP/Weight 02/14/2019 02/02/2019 01/24/2019 01/03/2019 12/14/2018 16/02/599 12/17/9975  Systolic BP 414 239 532 023 - 343 568  Diastolic BP 70 60 72 74 - 70 78  Wt. (Lbs) - - 241.4 240.08 - - 262  BMI 42.76 42.76 42.76 39.95 43.6 43.6 43.6

## 2019-02-19 NOTE — Assessment & Plan Note (Addendum)
Recurrent severe dermatitis with  Facial swelling and periorbital swelling, unclear etiology Depo medrol IM followed by oral prednisone dose pack. Currently being treated by allergist Reminded of symptoms of potential airway compromise and the need to seek emergency care should this occur

## 2019-02-19 NOTE — Assessment & Plan Note (Signed)
   Patient re-educated about  the importance of commitment to a  minimum of 150 minutes of exercise per week as able.  The importance of healthy food choices with portion control discussed, as well as eating regularly and within a 12 hour window most days. The need to choose "clean , green" food 50 to 75% of the time is discussed, as well as to make water the primary drink and set a goal of 64 ounces water daily.  .   Weight /BMI 02/14/2019 02/02/2019 01/24/2019  WEIGHT - - 241 lb 6.4 oz  HEIGHT 5\' 3"  5\' 3"  5\' 3"   BMI 42.76 kg/m2 42.76 kg/m2 42.76 kg/m2

## 2019-03-01 DIAGNOSIS — L501 Idiopathic urticaria: Secondary | ICD-10-CM | POA: Diagnosis not present

## 2019-03-02 ENCOUNTER — Ambulatory Visit: Payer: BC Managed Care – PPO

## 2019-03-02 ENCOUNTER — Ambulatory Visit: Payer: BC Managed Care – PPO | Admitting: Allergy & Immunology

## 2019-03-02 ENCOUNTER — Encounter: Payer: Self-pay | Admitting: Allergy & Immunology

## 2019-03-02 ENCOUNTER — Other Ambulatory Visit: Payer: Self-pay

## 2019-03-02 VITALS — BP 126/74 | HR 58 | Temp 96.9°F | Resp 18

## 2019-03-02 DIAGNOSIS — L501 Idiopathic urticaria: Secondary | ICD-10-CM

## 2019-03-02 DIAGNOSIS — L2089 Other atopic dermatitis: Secondary | ICD-10-CM | POA: Diagnosis not present

## 2019-03-02 NOTE — Progress Notes (Signed)
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 Angioedema - unknown trigger with lab workup that has been normal thus far - We will continue the prednisone, but change to one tablet daily for two more weeks so that we can wean you off. - If we stop the prednisone too quickly, it can lead to adrenal problems.  - We will schedule you for patch testing (to be placed on Monday July 6th in [place]) with readings on July 8th and July 10th in the [place] office.  - Xolair #2 given today.  - In the meantime, continue with suppressive dosing of antihistamines:   - Morning: Zyrtec (cetirizine) 10-20mg  (one or two tablets) + Pepcid (famotidine) 20mg   - Evening: Zyrtec (cetirizine) 10-20mg  (one or two tablets) + Pepcid (famotidine) 20mg  + Singulair (montelukast) 10mg   - You can change this dosing at home, decreasing the dose as needed or increasing the dosing as needed. - Our goal is to get you off of these medications and hopefully the Xolair can help with this.   2. Return in about 17 days (around 03/19/2019). This can be an in-person, a virtual Webex or a telephone follow up visit.   Subjective:   Stefanie Taylor is a 63 y.o. female presenting today for follow up of  Chief Complaint  Patient presents with  . Angioedema    she is still taking prednisone to help control her symptoms. she has been using an otc eczema cream and symptoms have improved.     Stefanie Taylor has a history of the following: Patient Active Problem List   Diagnosis Date Noted  . Allergic reaction 02/19/2019  . Poor urinary stream 07/20/2018  . Need for shingles vaccine 07/20/2018  . Osteoarthritis 03/13/2015  . Allergic rhinitis 08/24/2013  . Morbid obesity, Weight - 324, BMI - 53.9.  08/02/2012  . 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 HTN (hypertension) 02/23/2012    History obtained from: chart review and patient.  Stefanie Taylor is a 63 y.o. female presenting for a follow up visit.  She was last seen in May 2020.  At that time, we obtained a repeat complete blood count as well as a seafood panel and hereditary angioedema labs.  All of these were normal.  We did decide to start Xolair and we did give her a sample that day.  We continued her suppressive antihistamine doses including Zyrtec 10 to 20 mg plus Pepcid 20 mg twice daily.  We also continued Singulair 10 mg at night.  We reinforced the need for her to get her Auvi-Q.  Since last visit, she has mostly done well.  However, it should be noted that she has been on prednisone off and on (more often than not).  She has a prescription for prednisone for mast, which was supposed to be a taper.  She also apparently obtained some from her primary care provider.  Therefore, while her symptoms have been fairly well controlled, she has been on prednisone during the majority of the time.  She still notes no particular trigger for this.  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  She says that this affects how she is able to do her job.  She did go see her hairstylist last week.  Her hairstylist  throughout the idea that maybe this is secondary to some eczema.  She is shows me some pictures on her phone from [employer] showing a periorbital edema just like she has.  The only cosmetic she has in her history is her haircoloring.  Because her face was still inflamed, her stylist apparently used a different kind of diet.  However, the last time that she had her hair dyed was about 2 or 3 weeks before the swelling started.  She has been using the stylus for quite some time.  Her swelling is isolated to her face and eyelids for the most part, although she does have itching over her entire body.  She has never had  patch testing performed.  Currently she is not using any kind of cosmetic because she is wearing a mask.  Otherwise, there have been no changes to her past medical history, surgical history, family history, or social history.    Review of Systems  Constitutional: Negative.  Negative for fever, malaise/fatigue and weight loss.  HENT: Negative.  Negative for congestion, ear discharge and ear pain.   Eyes: Negative for pain, discharge and redness.  Respiratory: Negative for cough, sputum production, shortness of breath and wheezing.   Cardiovascular: Negative.  Negative for chest pain and palpitations.  Gastrointestinal: Negative for abdominal pain, heartburn, nausea and vomiting.  Skin: Positive for itching and rash.  Neurological: Negative for dizziness and headaches.  Endo/Heme/Allergies: Negative for environmental allergies. Does not bruise/bleed easily.       Objective:   Blood pressure 126/74, pulse (!) 58, temperature (!) 96.9 F (36.1 C), temperature source Temporal, resp. rate 18, SpO2 98 %. There is no height or weight on file to calculate BMI.   Physical Exam:  Physical Exam  Constitutional: She appears well-developed.  HENT:  Head: Normocephalic and atraumatic.  Right Ear: Tympanic membrane, external ear and ear canal normal.  Left Ear: Tympanic membrane and ear canal normal.  Nose: No mucosal edema, rhinorrhea, nasal deformity or septal deviation. No epistaxis. Right sinus exhibits no maxillary sinus tenderness and no frontal sinus tenderness. Left sinus exhibits no maxillary sinus tenderness and no frontal sinus tenderness.  Mouth/Throat: Uvula is midline and oropharynx is clear and moist. Mucous membranes are not pale and not dry.  Eyes: Pupils are equal, round, and reactive to light. Conjunctivae and EOM are normal. Right eye exhibits no chemosis and no discharge. Left eye exhibits no chemosis and no discharge. Right conjunctiva is not injected. Left conjunctiva is  not injected.  She does have some mild periorbital edema, but it does not seem as bad as it was last time.  Cardiovascular: Normal rate, regular rhythm and normal heart sounds.  Respiratory: Effort normal and breath sounds normal. No accessory muscle usage. No tachypnea. No respiratory distress. She has no wheezes. She has no rhonchi. She has no rales. She exhibits no tenderness.  Moving air well in all lung fields.  Lymphadenopathy:    She has no cervical adenopathy.  Neurological: She is alert.  Skin: No abrasion, no petechiae and no rash noted. Rash is not papular, not vesicular and not urticarial. No erythema. No pallor.  Some excoriations present on the arm.   Psychiatric: She has a normal mood and affect.     Diagnostic studies: none      Salvatore Marvel, MD  [facility] [place] [facility] [place]

## 2019-03-02 NOTE — Patient Instructions (Addendum)
1. Angioedema - unknown trigger with lab workup that has been normal thus far - We will continue the prednisone, but change to one tablet daily for two more weeks so that we can wean you off. - If we stop the prednisone too quickly, it can lead to adrenal problems.  - We will schedule you for patch testing (to be placed on Monday July 6th in [place]) with readings on July 8th and July 10th in the [place] office.  - Xolair #2 given today.  - In the meantime, continue with suppressive dosing of antihistamines:   - Morning: Zyrtec (cetirizine) 10-20mg  (one or two tablets) + Pepcid (famotidine) 20mg   - Evening: Zyrtec (cetirizine) 10-20mg  (one or two tablets) + Pepcid (famotidine) 20mg  + Singulair (montelukast) 10mg   - You can change this dosing at home, decreasing the dose as needed or increasing the dosing as needed. - Our goal is to get you off of these medications and hopefully the Xolair can help with this.   2. Return in about 17 days (around 03/19/2019). This can be an in-person, a virtual Webex or a telephone follow up visit.   Please inform us of any Emergency Department visits, hospitalizations, or changes in symptoms. Call us before going to the ED for breathing or allergy symptoms since we might be able to fit you in for a sick visit. Feel free to contact us anytime with any questions, problems, or concerns.  It was a pleasure to see you again today! If you need to get a hold of me, call or text me at [phone].   Websites that have reliable patient information: 1. American Academy of Asthma, Allergy, and Immunology: www.aaaai.org 2. Food Allergy Research and Education (FARE): foodallergy.org 3. Mothers of Asthmatics: http://www.asthmacommunitynetwork.org 4. American College of Allergy, Asthma, and Immunology: www.acaai.org  "Like" [place] on Facebook and Instagram for our latest updates!      Make sure you are registered to vote! If you have moved or changed any of your  contact information, you will need to get this updated before voting!    Voter ID laws are NOT going into effect for the General Election in November 2020! DO NOT let this stop you from exercising your right to vote!

## 2019-03-04 ENCOUNTER — Encounter: Payer: Self-pay | Admitting: Allergy & Immunology

## 2019-03-04 MED ORDER — CETIRIZINE HCL 10 MG PO TABS: 10.0000 mg | ORAL_TABLET | Freq: Two times a day (BID) | ORAL | 5 refills | Status: DC

## 2019-03-04 MED ORDER — PREDNISONE 10 MG PO TABS: 10.0000 mg | ORAL_TABLET | Freq: Every day | ORAL | 0 refills | Status: AC

## 2019-03-04 MED ORDER — MONTELUKAST SODIUM 10 MG PO TABS: 10.0000 mg | ORAL_TABLET | Freq: Every day | ORAL | 5 refills | Status: DC

## 2019-03-04 MED ORDER — FAMOTIDINE 20 MG PO TABS: 20.0000 mg | ORAL_TABLET | Freq: Two times a day (BID) | ORAL | 5 refills | Status: DC

## 2019-03-06 ENCOUNTER — Telehealth: Payer: Self-pay

## 2019-03-06 NOTE — Telephone Encounter (Signed)
-----   Message from Valentina Shaggy, MD sent at 03/04/2019  7:35 AM EDT ----- Can you call Ms. Schmiesing to reschedule her patch testing for one month out? The prednisone that she is on can make the patch testing have false negatives. She is supposed  to be off prednisone in two weeks. I would like her off prednisone for 3-4 weeks before we do the patch testing.

## 2019-03-06 NOTE — Telephone Encounter (Signed)
Hey I sent her a Therapist, music. The number we have on file keeps ringing weird and then cutting off saying its unavailable.

## 2019-03-06 NOTE — Telephone Encounter (Signed)
Thanks for taking care of this, Stefanie Taylor!   Salvatore Marvel, MD [facility] [place]

## 2019-03-19 ENCOUNTER — Ambulatory Visit: Payer: BC Managed Care – PPO | Admitting: Allergy

## 2019-03-21 ENCOUNTER — Encounter: Payer: BC Managed Care – PPO | Admitting: Allergy

## 2019-03-22 ENCOUNTER — Other Ambulatory Visit: Payer: Self-pay

## 2019-03-22 ENCOUNTER — Ambulatory Visit
Admission: RE | Admit: 2019-03-22 | Discharge: 2019-03-22 | Disposition: A | Discharge status: Home / Self Care | Payer: BC Managed Care – PPO | Source: Ambulatory Visit | Attending: Family Medicine | Admitting: Family Medicine

## 2019-03-22 DIAGNOSIS — Z1231 Encounter for screening mammogram for malignant neoplasm of breast: Secondary | ICD-10-CM

## 2019-03-23 ENCOUNTER — Encounter: Payer: BC Managed Care – PPO | Admitting: Family Medicine

## 2019-03-28 ENCOUNTER — Encounter: Payer: BC Managed Care – PPO | Admitting: Allergy

## 2019-03-29 DIAGNOSIS — L501 Idiopathic urticaria: Secondary | ICD-10-CM | POA: Diagnosis not present

## 2019-03-30 ENCOUNTER — Ambulatory Visit (INDEPENDENT_AMBULATORY_CARE_PROVIDER_SITE_OTHER): Payer: BC Managed Care – PPO | Admitting: *Deleted

## 2019-03-30 ENCOUNTER — Ambulatory Visit: Payer: BC Managed Care – PPO | Admitting: Allergy & Immunology

## 2019-03-30 DIAGNOSIS — L501 Idiopathic urticaria: Secondary | ICD-10-CM | POA: Diagnosis not present

## 2019-04-16 ENCOUNTER — Ambulatory Visit: Payer: BC Managed Care – PPO | Admitting: Allergy

## 2019-04-18 ENCOUNTER — Encounter: Payer: BC Managed Care – PPO | Admitting: Allergy

## 2019-04-20 ENCOUNTER — Encounter: Payer: BC Managed Care – PPO | Admitting: Allergy

## 2019-04-27 ENCOUNTER — Other Ambulatory Visit: Payer: Self-pay

## 2019-04-27 ENCOUNTER — Ambulatory Visit: Payer: BC Managed Care – PPO

## 2019-04-27 ENCOUNTER — Ambulatory Visit (INDEPENDENT_AMBULATORY_CARE_PROVIDER_SITE_OTHER): Payer: BC Managed Care – PPO | Admitting: *Deleted

## 2019-04-27 DIAGNOSIS — L501 Idiopathic urticaria: Secondary | ICD-10-CM

## 2019-05-03 ENCOUNTER — Encounter: Payer: Self-pay | Admitting: Family Medicine

## 2019-05-03 ENCOUNTER — Other Ambulatory Visit: Payer: Self-pay

## 2019-05-03 ENCOUNTER — Ambulatory Visit (INDEPENDENT_AMBULATORY_CARE_PROVIDER_SITE_OTHER): Payer: BC Managed Care – PPO | Admitting: Family Medicine

## 2019-05-03 VITALS — BP 116/70 | HR 57 | Resp 12 | Ht 65.0 in | Wt 231.0 lb

## 2019-05-03 DIAGNOSIS — M159 Polyosteoarthritis, unspecified: Secondary | ICD-10-CM

## 2019-05-03 DIAGNOSIS — J3089 Other allergic rhinitis: Secondary | ICD-10-CM

## 2019-05-03 DIAGNOSIS — M150 Primary generalized (osteo)arthritis: Secondary | ICD-10-CM

## 2019-05-03 DIAGNOSIS — Z23 Encounter for immunization: Secondary | ICD-10-CM

## 2019-05-03 DIAGNOSIS — E559 Vitamin D deficiency, unspecified: Secondary | ICD-10-CM

## 2019-05-03 NOTE — Assessment & Plan Note (Signed)
Improved with weight loss 

## 2019-05-03 NOTE — Addendum Note (Signed)
Addended by: Obie Dredge A on: 05/03/2019 09:06 AM   Modules accepted: Orders

## 2019-05-03 NOTE — Progress Notes (Signed)
   Stefanie Taylor     MRN: [id number]      DOB: Oct 18, 1955   HPI Stefanie Taylor is here for follow up and re-evaluation of chronic medical conditions, medication management and review of any available recent lab and radiology data.  Preventive health is updated, specifically  Cancer screening and Immunization.   Questions or concerns regarding consultations or procedures which the PT has had in the interim are  addressed. The PT denies any adverse reactions to current medications since the last visit.  There are no new concerns.  There are no specific complaints   ROS BP 116/70   Pulse (!) 57   Resp 12   Ht 5\' 5"  (1.651 m)   Wt 231 lb 0.6 oz (104.8 kg)   SpO2 97%   BMI 38.45 kg/m   Denies recent fever or chills. Denies sinus pressure, nasal congestion, ear pain or sore throat. Denies chest congestion, productive cough or wheezing. Denies chest pains, palpitations and leg swelling Denies abdominal pain, nausea, vomiting,diarrhea or constipation.   Denies dysuria, frequency, hesitancy or incontinence. Denies joint pain, swelling and limitation in mobility. Denies headaches, seizures, numbness, or tingling. Denies depression, anxiety or insomnia. Denies skin break down or rash.   PE  BP 116/70   Pulse (!) 57   Resp 12   Ht 5\' 5"  (1.651 m)   Wt 231 lb 0.6 oz (104.8 kg)   SpO2 97%   BMI 38.45 kg/m   Patient alert and oriented and in no cardiopulmonary distress.  HEENT: No facial asymmetry, EOMI,   oropharynx pink and moist.  Neck supple no JVD, no mass.  Chest: Clear to auscultation bilaterally.  CVS: S1, S2 no murmurs, no S3.Regular rate.  ABD: Soft non tender.   Ext: No edema  MS: Adequate ROM spine, shoulders, hips and knees.  Skin: Intact, no ulcerations or rash noted.  Psych: Good eye contact, normal affect. Memory intact not anxious or depressed appearing.  CNS: CN 2-12 intact, power,  normal throughout.no focal deficits noted.   Assessment & Plan   Morbid obesity, Weight - 324, BMI - 53.9. Excellent weight loss following bariatric surgery Patient re-educated about  the importance of commitment to a  minimum of 150 minutes of exercise per week as able.  The importance of healthy food choices with portion control discussed, as well as eating regularly and within a 12 hour window most days. The need to choose "clean , green" food 50 to 75% of the time is discussed, as well as to make water the primary drink and set a goal of 64 ounces water daily.    Weight /BMI 05/03/2019 02/14/2019 02/02/2019  WEIGHT 231 lb 0.6 oz - -  HEIGHT 5\' 5"  5\' 3"  5\' 3"   BMI 38.45 kg/m2 42.76 kg/m2 42.76 kg/m2      Osteoarthritis Improved with weight loss  Allergic rhinitis Currently getting immunotherapy, improved

## 2019-05-03 NOTE — Patient Instructions (Addendum)
F/u in 6 months, call if you need me before  TSH and Vit D today ( [facility])  Please  Reach out to Bariatric surgeon for referral  To plastic, contact me if needed  Flu vaccine today  It is important that you exercise regularly at least 30 minutes 5 times a week. If you develop chest pain, have severe difficulty breathing, or feel very tired, stop exercising immediately and seek medical attention    Think about what you will eat, plan ahead. Choose " clean, green, fresh or frozen" over canned, processed or packaged foods which are more sugary, salty and fatty. 70 to 75% of food eaten should be vegetables and fruit. Three meals at set times with snacks allowed between meals, but they must be fruit or vegetables. Aim to eat over a 12 hour period , example 7 am to 7 pm, and STOP after  your last meal of the day. Drink water,generally about 64 ounces per day, no other drink is as healthy. Fruit juice is best enjoyed in a healthy way, by EATING the fruit.  Thanks for choosing [facility], we consider it a privelige to serve you.

## 2019-05-03 NOTE — Assessment & Plan Note (Signed)
Excellent weight loss following bariatric surgery Patient re-educated about  the importance of commitment to a  minimum of 150 minutes of exercise per week as able.  The importance of healthy food choices with portion control discussed, as well as eating regularly and within a 12 hour window most days. The need to choose "clean , green" food 50 to 75% of the time is discussed, as well as to make water the primary drink and set a goal of 64 ounces water daily.    Weight /BMI 05/03/2019 02/14/2019 02/02/2019  WEIGHT 231 lb 0.6 oz - -  HEIGHT 5\' 5"  5\' 3"  5\' 3"   BMI 38.45 kg/m2 42.76 kg/m2 42.76 kg/m2

## 2019-05-03 NOTE — Assessment & Plan Note (Signed)
Currently getting immunotherapy, improved

## 2019-05-04 ENCOUNTER — Other Ambulatory Visit: Payer: Self-pay | Admitting: Family Medicine

## 2019-05-09 ENCOUNTER — Encounter: Payer: Self-pay | Admitting: Allergy & Immunology

## 2019-05-09 ENCOUNTER — Other Ambulatory Visit: Payer: Self-pay | Admitting: Allergy & Immunology

## 2019-05-24 DIAGNOSIS — L501 Idiopathic urticaria: Secondary | ICD-10-CM

## 2019-05-25 ENCOUNTER — Ambulatory Visit (INDEPENDENT_AMBULATORY_CARE_PROVIDER_SITE_OTHER): Payer: BC Managed Care – PPO

## 2019-05-25 ENCOUNTER — Other Ambulatory Visit: Payer: Self-pay

## 2019-05-25 DIAGNOSIS — L501 Idiopathic urticaria: Secondary | ICD-10-CM | POA: Diagnosis not present

## 2019-06-21 DIAGNOSIS — L501 Idiopathic urticaria: Secondary | ICD-10-CM | POA: Diagnosis not present

## 2019-06-22 ENCOUNTER — Ambulatory Visit (INDEPENDENT_AMBULATORY_CARE_PROVIDER_SITE_OTHER): Payer: BC Managed Care – PPO

## 2019-06-22 DIAGNOSIS — L501 Idiopathic urticaria: Secondary | ICD-10-CM | POA: Diagnosis not present

## 2019-07-04 ENCOUNTER — Ambulatory Visit: Payer: BC Managed Care – PPO | Admitting: Family Medicine

## 2019-07-16 ENCOUNTER — Telehealth: Payer: BC Managed Care – PPO | Admitting: Physician Assistant

## 2019-07-16 DIAGNOSIS — T8089XA Other complications following infusion, transfusion and therapeutic injection, initial encounter: Secondary | ICD-10-CM

## 2019-07-16 DIAGNOSIS — Y848 Other medical procedures as the cause of abnormal reaction of the patient, or of later complication, without mention of misadventure at the time of the procedure: Secondary | ICD-10-CM | POA: Diagnosis not present

## 2019-07-16 DIAGNOSIS — T783XXA Angioneurotic edema, initial encounter: Secondary | ICD-10-CM

## 2019-07-16 MED ORDER — HYDROXYZINE HCL 25 MG PO TABS: 12.5000 mg | ORAL_TABLET | Freq: Every evening | ORAL | 0 refills | Status: AC | PRN

## 2019-07-16 MED ORDER — PREDNISONE 5 MG PO TABS: ORAL_TABLET | ORAL | 0 refills | Status: DC

## 2019-07-16 NOTE — Progress Notes (Signed)
E visit for Allergic Rhinitis We are sorry that you are not feeling well.  Here is how we plan to help!  Based on what you have shared with me it looks like you have Allergic Rhinitis.  Rhinitis is when a reaction occurs that causes nasal congestion, runny nose, sneezing, and itching.  Most types of rhinitis are caused by an inflammation and are associated with symptoms in the eyes ears or throat. There are several types of rhinitis.  The most common are acute rhinitis, which is usually caused by a viral illness, allergic or seasonal rhinitis, and nonallergic or year-round rhinitis.  Nasal allergies occur certain times of the year.  Allergic rhinitis is caused when allergens in the air trigger the release of histamine in the body.  Histamine causes itching, swelling, and fluid to build up in the fragile linings of the nasal passages, sinuses and eyelids.  An itchy nose and clear discharge are common. Please follow up with your PCP. Go to the Emergency Department if your symptoms worsen.   I recommend the following over the counter treatments: - famotidine (PEPCID) 20 MG tablet; Take 1 tablet (20 mg total) by mouth daily for 7 days. Please take for lip swelling.  Dispense: 7 tablet; Refill: 0 - hydrOXYzine (ATARAX/VISTARIL) 25 MG tablet; Take one to two tablets at bedtime as needed , for lip swelling  Dispense: 14 tablet; Refill: 0   I have prescribed the following medication: Prednisone 5 mg daily for 6 days (see taper instructions below)  Directions for 6 day taper: Day 1: 2 tablets before breakfast, 1 after both lunch & dinner and 2 at bedtime Day 2: 1 tab before breakfast, 1 after both lunch & dinner and 2 at bedtime Day 3: 1 tab at each meal & 1 at bedtime Day 4: 1 tab at breakfast, 1 at lunch, 1 at bedtime Day 5: 1 tab at breakfast & 1 tab at bedtime Day 6: 1 tab at breakfast  I also would recommend a nasal spray: Saline 1 spray into each nostril as needed  You may also benefit from  eye drops such as: Systane 1-2 driops each eye twice daily as needed  HOME CARE:   You can use an over-the-counter saline nasal spray as needed  Avoid areas where there is heavy dust, mites, or molds  Stay indoors on windy days during the pollen season  Keep windows closed in home, at least in bedroom; use air conditioner.  Use high-efficiency house air filter  Keep windows closed in car, turn AC on re-circulate  Avoid playing out with dog during pollen season  GET HELP RIGHT AWAY IF:   If your symptoms do not improve within 10 days  You become short of breath  You develop yellow or green discharge from your nose for over 3 days  You have coughing fits  MAKE SURE YOU:   Understand these instructions  Will watch your condition  Will get help right away if you are not doing well or get worse  Thank you for choosing an e-visit. Your e-visit answers were reviewed by a board certified advanced clinical practitioner to complete your personal care plan. Depending upon the condition, your plan could have included both over the counter or prescription medications. Please review your pharmacy choice. Be sure that the pharmacy you have chosen is open so that you can pick up your prescription now.  If there is a problem you may message your provider in [place] to have the  prescription routed to another pharmacy. Your safety is important to [place]. If you have drug allergies check your prescription carefully.  For the next 24 hours, you can use MyChart to ask questions about today's visit, request a non-urgent call back, or ask for a work or school excuse from your e-visit provider. You will get an email in the next two days asking about your experience. I hope that your e-visit has been valuable and will speed your recovery.   Greater than 5 minutes, yet less than 10 minutes of time have been spent researching, coordinating and implementing care for this patient today.

## 2019-07-19 DIAGNOSIS — L501 Idiopathic urticaria: Secondary | ICD-10-CM | POA: Diagnosis not present

## 2019-07-20 ENCOUNTER — Other Ambulatory Visit: Payer: Self-pay

## 2019-07-20 ENCOUNTER — Ambulatory Visit: Payer: BC Managed Care – PPO

## 2019-07-20 ENCOUNTER — Ambulatory Visit (INDEPENDENT_AMBULATORY_CARE_PROVIDER_SITE_OTHER): Payer: BC Managed Care – PPO

## 2019-07-20 DIAGNOSIS — L501 Idiopathic urticaria: Secondary | ICD-10-CM | POA: Diagnosis not present

## 2019-08-17 ENCOUNTER — Encounter: Payer: Self-pay | Admitting: Allergy & Immunology

## 2019-08-17 ENCOUNTER — Other Ambulatory Visit: Payer: Self-pay | Admitting: Allergy & Immunology

## 2019-08-17 ENCOUNTER — Ambulatory Visit: Payer: Self-pay

## 2019-08-17 ENCOUNTER — Encounter: Payer: Self-pay | Admitting: Family Medicine

## 2019-08-17 ENCOUNTER — Telehealth: Payer: BC Managed Care – PPO | Admitting: Physician Assistant

## 2019-08-17 DIAGNOSIS — Z20822 Contact with and (suspected) exposure to covid-19: Secondary | ICD-10-CM

## 2019-08-17 DIAGNOSIS — Z20828 Contact with and (suspected) exposure to other viral communicable diseases: Secondary | ICD-10-CM

## 2019-08-17 MED ORDER — HYDROXYZINE HCL 25 MG PO TABS: 12.5000 mg | ORAL_TABLET | Freq: Every evening | ORAL | 0 refills | Status: DC | PRN

## 2019-08-17 MED ORDER — MONTELUKAST SODIUM 10 MG PO TABS: 10.0000 mg | ORAL_TABLET | Freq: Every day | ORAL | 5 refills | Status: DC

## 2019-08-17 MED ORDER — MONTELUKAST SODIUM 10 MG PO TABS: 10.0000 mg | ORAL_TABLET | Freq: Every day | ORAL | 1 refills | Status: DC

## 2019-08-17 MED ORDER — CETIRIZINE HCL 10 MG PO TABS: 10.0000 mg | ORAL_TABLET | Freq: Two times a day (BID) | ORAL | 1 refills | Status: DC

## 2019-08-17 NOTE — Progress Notes (Signed)
I have spent 5 minutes in review of e-visit questionnaire, review and updating patient chart, medical decision making and response to patient.   Broghan Pannone Cody Ivi Griffith, PA-C    

## 2019-08-17 NOTE — Progress Notes (Signed)
E-Visit for Corona Virus Screening   Your current symptoms could be consistent with the coronavirus.  Many health care providers can now test patients at their office but not all are.  [facility] has multiple testing sites. For information on our COVID testing locations and hours go to [email or web address]  Please quarantine yourself while awaiting your test results.  We are enrolling you in our [place] for [place] . Daily you will receive a questionnaire within the [place] website. Our COVID 19 response team willl be monitoriing your responses daily. Please continue good preventive care measures, including:  frequent hand-washing, avoid touching your face, cover coughs/sneezes, stay out of crowds and keep a 6 foot distance from others.    COVID-19 is a respiratory illness with symptoms that are similar to the flu. Symptoms are typically mild to moderate, but there have been cases of severe illness and death due to the virus. The following symptoms may appear 2-14 days after exposure: . Fever . Cough . Shortness of breath or difficulty breathing . Chills . Repeated shaking with chills . Muscle pain . Headache . Sore throat . New loss of taste or smell . Fatigue . Congestion or runny nose . Nausea or vomiting . Diarrhea  If you develop fever/cough/breathlessness, please stay home for 10 days with improving symptoms and until you have had 24 hours of no fever (without taking a fever reducer).  Go to the nearest hospital ED for assessment if fever/cough/breathlessness are severe or illness seems like a threat to life.  It is vitally important that if you feel that you have an infection such as this virus or any other virus that you stay home and away from places where you may spread it to others.  You should avoid contact with people age 4 and older.   You should wear a mask or cloth face covering over your nose and mouth if you must be around other  people or animals, including pets (even at home). Try to stay at least 6 feet away from other people. This will protect the people around you.   You may also take acetaminophen (Tylenol) as needed for fever.   Reduce your risk of any infection by using the same precautions used for avoiding the common cold or flu:  [place] Kitchen Wash your hands often with soap and warm water for at least 20 seconds.  If soap and water are not readily available, use an alcohol-based hand sanitizer with at least 60% alcohol.  . If coughing or sneezing, cover your mouth and nose by coughing or sneezing into the elbow areas of your shirt or coat, into a tissue or into your sleeve (not your hands). . Avoid shaking hands with others and consider head nods or verbal greetings only. . Avoid touching your eyes, nose, or mouth with unwashed hands.  . Avoid close contact with people who are sick. . Avoid places or events with large numbers of people in one location, like concerts or sporting events. . Carefully consider travel plans you have or are making. . If you are planning any travel outside or inside the [place], visit the CDC's Travelers' Health webpage for the latest health notices. . If you have some symptoms but not all symptoms, continue to monitor at home and seek medical attention if your symptoms worsen. . If you are having a medical emergency, call 911.  HOME CARE . Only take medications as instructed by your medical team. . Drink plenty of fluids and  get plenty of rest. . A steam or ultrasonic humidifier can help if you have congestion.   GET HELP RIGHT AWAY IF YOU HAVE EMERGENCY WARNING SIGNS** FOR COVID-19. If you or someone is showing any of these signs seek emergency medical care immediately. Call 911 or proceed to your closest emergency facility if: . You develop worsening high fever. . Trouble breathing . Bluish lips or face . Persistent pain or pressure in the chest . New confusion . Inability to wake or stay  awake . You cough up blood. . Your symptoms become more severe  **This list is not all possible symptoms. Contact your medical provider for any symptoms that are sever or concerning to you.   MAKE SURE YOU   Understand these instructions.  Will watch your condition.  Will get help right away if you are not doing well or get worse.  Your e-visit answers were reviewed by a board certified advanced clinical practitioner to complete your personal care plan.  Depending on the condition, your plan could have included both over the counter or prescription medications.  If there is a problem please reply once you have received a response from your provider.  Your safety is important to [place].  If you have drug allergies check your prescription carefully.    You can use MyChart to ask questions about today's visit, request a non-urgent call back, or ask for a work or school excuse for 24 hours related to this e-Visit. If it has been greater than 24 hours you will need to follow up with your provider, or enter a new e-Visit to address those concerns. You will get an e-mail in the next two days asking about your experience.  I hope that your e-visit has been valuable and will speed your recovery. Thank you for using e-visits.

## 2019-08-30 DIAGNOSIS — L501 Idiopathic urticaria: Secondary | ICD-10-CM | POA: Diagnosis not present

## 2019-08-31 ENCOUNTER — Ambulatory Visit (INDEPENDENT_AMBULATORY_CARE_PROVIDER_SITE_OTHER): Payer: BC Managed Care – PPO

## 2019-08-31 ENCOUNTER — Other Ambulatory Visit: Payer: Self-pay

## 2019-08-31 DIAGNOSIS — L501 Idiopathic urticaria: Secondary | ICD-10-CM | POA: Diagnosis not present

## 2019-09-27 DIAGNOSIS — L501 Idiopathic urticaria: Secondary | ICD-10-CM | POA: Diagnosis not present

## 2019-09-28 ENCOUNTER — Ambulatory Visit (INDEPENDENT_AMBULATORY_CARE_PROVIDER_SITE_OTHER): Payer: BC Managed Care – PPO

## 2019-09-28 ENCOUNTER — Other Ambulatory Visit: Payer: Self-pay

## 2019-09-28 DIAGNOSIS — L501 Idiopathic urticaria: Secondary | ICD-10-CM

## 2019-10-25 DIAGNOSIS — L501 Idiopathic urticaria: Secondary | ICD-10-CM | POA: Diagnosis not present

## 2019-10-26 ENCOUNTER — Other Ambulatory Visit: Payer: Self-pay

## 2019-10-26 ENCOUNTER — Ambulatory Visit (INDEPENDENT_AMBULATORY_CARE_PROVIDER_SITE_OTHER): Payer: BC Managed Care – PPO

## 2019-10-26 DIAGNOSIS — L501 Idiopathic urticaria: Secondary | ICD-10-CM

## 2019-11-08 ENCOUNTER — Ambulatory Visit: Payer: BC Managed Care – PPO | Admitting: Family Medicine

## 2019-11-12 ENCOUNTER — Other Ambulatory Visit: Payer: Self-pay | Admitting: *Deleted

## 2019-11-12 MED ORDER — CETIRIZINE HCL 10 MG PO TABS: 10.0000 mg | ORAL_TABLET | Freq: Two times a day (BID) | ORAL | 0 refills | Status: DC

## 2019-11-21 ENCOUNTER — Ambulatory Visit: Payer: BC Managed Care – PPO | Admitting: Family Medicine

## 2019-11-22 DIAGNOSIS — L501 Idiopathic urticaria: Secondary | ICD-10-CM

## 2019-11-23 ENCOUNTER — Ambulatory Visit (INDEPENDENT_AMBULATORY_CARE_PROVIDER_SITE_OTHER): Payer: BC Managed Care – PPO

## 2019-11-23 ENCOUNTER — Other Ambulatory Visit: Payer: Self-pay

## 2019-11-23 DIAGNOSIS — L501 Idiopathic urticaria: Secondary | ICD-10-CM | POA: Diagnosis not present

## 2019-12-05 ENCOUNTER — Ambulatory Visit: Payer: BC Managed Care – PPO | Admitting: Family Medicine

## 2020-01-16 ENCOUNTER — Other Ambulatory Visit: Payer: Self-pay

## 2020-01-16 ENCOUNTER — Ambulatory Visit (INDEPENDENT_AMBULATORY_CARE_PROVIDER_SITE_OTHER): Payer: Medicare PPO

## 2020-01-16 DIAGNOSIS — L501 Idiopathic urticaria: Secondary | ICD-10-CM

## 2020-01-17 ENCOUNTER — Telehealth: Payer: Self-pay | Admitting: *Deleted

## 2020-01-17 NOTE — Telephone Encounter (Signed)
Called patient and advised change from buy and bill to [facility]. Will need new [employer] card and she will need appt with MD which she has in order to get approval for drug so she may not have Rx there for her 6/4 appt due to getting approval and send to pharmacy

## 2020-01-18 ENCOUNTER — Ambulatory Visit: Payer: Self-pay

## 2020-02-05 ENCOUNTER — Other Ambulatory Visit: Payer: Self-pay | Admitting: *Deleted

## 2020-02-08 ENCOUNTER — Other Ambulatory Visit: Payer: Self-pay

## 2020-02-08 ENCOUNTER — Ambulatory Visit (INDEPENDENT_AMBULATORY_CARE_PROVIDER_SITE_OTHER): Payer: Medicare PPO | Admitting: Allergy & Immunology

## 2020-02-08 ENCOUNTER — Encounter: Payer: Self-pay | Admitting: Allergy & Immunology

## 2020-02-08 VITALS — BP 128/68 | HR 60 | Resp 18

## 2020-02-08 DIAGNOSIS — L501 Idiopathic urticaria: Secondary | ICD-10-CM

## 2020-02-08 DIAGNOSIS — L2089 Other atopic dermatitis: Secondary | ICD-10-CM | POA: Diagnosis not present

## 2020-02-08 MED ORDER — CETIRIZINE HCL 10 MG PO TABS: 10.0000 mg | ORAL_TABLET | Freq: Two times a day (BID) | ORAL | 5 refills | Status: DC

## 2020-02-08 MED ORDER — MONTELUKAST SODIUM 10 MG PO TABS: 10.0000 mg | ORAL_TABLET | Freq: Every day | ORAL | 5 refills | Status: DC

## 2020-02-08 MED ORDER — EPINEPHRINE 0.3 MG/0.3ML IJ SOAJ: 0.3000 mg | INTRAMUSCULAR | 1 refills | Status: DC | PRN

## 2020-02-08 NOTE — Progress Notes (Addendum)
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 Angioedema - unknown trigger with normal lab workup - Continue with Xolair every four weeks. - We can talk about weaning this at the next visit.  - I can understand that you do not want to make any changes at this time.  - EpiPen refilled today.  - In the meantime, continue with suppressive dosing of antihistamines:              - Morning: Zyrtec (cetirizine) 10-20mg  (one or two tablets)              - Evening: Zyrtec (cetirizine) 10-20mg  (one or two tablets) + Singulair (montelukast) 10mg    2. Return in about 1 year (around 02/07/2021). This can be an in-person, a virtual Webex or a telephone follow up visit.  Subjective:   Stefanie Taylor is a 64 y.o. female presenting today for follow up of  Chief Complaint  Patient presents with  . Urticaria    doing wonderful with the Xolair. minor eczema flares but no severe hive flares.     Stefanie Taylor has a history of the following: Patient Active Problem List   Diagnosis Date Noted  . Allergic reaction 02/19/2019  . Poor urinary stream 07/20/2018  . Need for shingles vaccine 07/20/2018  . Osteoarthritis 03/13/2015  . Allergic rhinitis 08/24/2013  . Morbid obesity, Weight - 324, BMI - 53.9. 08/02/2012  . 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  Stefanie Taylor is a 64 y.o. female presenting for a follow up visit. She was last seen in June 2020. At that time, we continued to wean her prednisone, changing to one tablet daily. She seemed to be doing better on the Xolair. We continued with suppressive dosing of antihistamines. Lab workup had been normal. We did discuss doing patch testing but it does not seem that she ever went through with that.   Since the last visit, she has continued to get Xolair  monthly. She reports good results with this and she has been able to wean off of all of her antihistamines. She denies any outbreaks at all on this regimen. She does remain on the montelukast as well. She is not really interested in changing this regimen at all since it has been working so well.   Eczema is well controlled with the current regimen. She does continue to see her dermatologist and she has had no outbreaks at all. She is moisturizing daily, often 2-3 times daily.   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 She has received both COVID19 vaccinations and she had some issues with the first injection. She did have a lot of fatigue with that injection but she recovered without a problem.   Otherwise, there have been no changes to her past medical history, surgical history, family history, or social history.    Review of Systems  Constitutional: Negative.  Negative for chills, fever, malaise/fatigue and weight loss.  HENT: Negative for congestion, ear discharge, ear pain, nosebleeds and sinus pain.   Eyes: Negative for pain, discharge and redness.  Respiratory: Negative for cough, sputum production, shortness of breath and wheezing.   Cardiovascular: Negative.  Negative for chest pain and palpitations.  Gastrointestinal: Negative for abdominal pain,  constipation, diarrhea, heartburn, nausea and vomiting.  Skin: Negative.  Negative for itching and rash.  Neurological: Negative for dizziness and headaches.  Endo/Heme/Allergies: Negative for environmental allergies. Does not bruise/bleed easily.       Objective:   Blood pressure 128/68, pulse 60, resp. rate 18, SpO2 97 %. There is no height or weight on file to calculate BMI.   Physical Exam:  Physical Exam  Constitutional: She appears well-developed.  Very talkative and friendly.   HENT:  Head: Normocephalic and  atraumatic.  Right Ear: Tympanic membrane, external ear and ear canal normal.  Left Ear: Tympanic membrane and ear canal normal.  Nose: No mucosal edema, rhinorrhea, nasal deformity or septal deviation. No epistaxis. Right sinus exhibits no maxillary sinus tenderness and no frontal sinus tenderness. Left sinus exhibits no maxillary sinus tenderness and no frontal sinus tenderness.  Mouth/Throat: Uvula is midline and oropharynx is clear and moist. Mucous membranes are not pale and not dry.  Eyes: Pupils are equal, round, and reactive to light. Conjunctivae and EOM are normal. Right eye exhibits no chemosis and no discharge. Left eye exhibits no chemosis and no discharge. Right conjunctiva is not injected. Left conjunctiva is not injected.  Cardiovascular: Normal rate, regular rhythm and normal heart sounds.  Respiratory: Effort normal and breath sounds normal. No accessory muscle usage. No tachypnea. No respiratory distress. She has no wheezes. She has no rhonchi. She has no rales. She exhibits no tenderness.  Lymphadenopathy:    She has no cervical adenopathy.  Neurological: She is alert.  Skin: No abrasion, no petechiae and no rash noted. Rash is not papular, not vesicular and not urticarial. No erythema. No pallor.  Clear skin without eczematous or urticarial lesions noted.   Psychiatric: She has a normal mood and affect.     Diagnostic studies: none      Salvatore Marvel, MD  [facility]

## 2020-02-08 NOTE — Patient Instructions (Addendum)
1. Angioedema - unknown trigger with normal lab workup - Continue with Xolair every four weeks. - We can talk about weaning this at the next visit.  - In the meantime, continue with suppressive dosing of antihistamines:              - Morning: Zyrtec (cetirizine) 10-20mg  (one or two tablets)              - Evening: Zyrtec (cetirizine) 10-20mg  (one or two tablets) + Singulair (montelukast) 10mg    2. Return in about 1 year (around 02/07/2021). This can be an in-person, a virtual Webex or a telephone follow up visit.   Please inform us of any Emergency Department visits, hospitalizations, or changes in symptoms. Call us before going to the ED for breathing or allergy symptoms since we might be able to fit you in for a sick visit. Feel free to contact us anytime with any questions, problems, or concerns.  It was a pleasure to see you again today!  Websites that have reliable patient information: 1. American Academy of Asthma, Allergy, and Immunology: www.aaaai.org 2. Food Allergy Research and Education (FARE): foodallergy.org 3. Mothers of Asthmatics: http://www.asthmacommunitynetwork.org 4. American College of Allergy, Asthma, and Immunology: www.acaai.org   COVID-19 Vaccine Information can be found at: ShippingScam.co.uk For questions related to vaccine distribution or appointments, please email vaccine@[email or web address] .com or call [phone].     "Like" [place] on Facebook and Instagram for our latest updates!       HAPPY SPRING!  Make sure you are registered to vote! If you have moved or changed any of your contact information, you will need to get this updated before voting!  In some cases, you MAY be able to register to vote online: [email or web address]

## 2020-02-09 ENCOUNTER — Encounter: Payer: Self-pay | Admitting: Allergy & Immunology

## 2020-02-14 ENCOUNTER — Telehealth: Payer: Self-pay | Admitting: *Deleted

## 2020-02-14 NOTE — Telephone Encounter (Signed)
Called patient and advised patient assistance for free drug that I will mail and she needs to include copy of her [employer] card and send back to me to resume Xolair

## 2020-02-15 ENCOUNTER — Ambulatory Visit: Payer: Self-pay

## 2020-02-19 ENCOUNTER — Other Ambulatory Visit: Payer: Self-pay | Admitting: Family Medicine

## 2020-02-19 DIAGNOSIS — Z1231 Encounter for screening mammogram for malignant neoplasm of breast: Secondary | ICD-10-CM

## 2020-03-03 ENCOUNTER — Other Ambulatory Visit: Payer: Self-pay

## 2020-03-03 ENCOUNTER — Telehealth: Payer: Self-pay | Admitting: Allergy & Immunology

## 2020-03-03 ENCOUNTER — Encounter: Payer: Self-pay | Admitting: Family Medicine

## 2020-03-03 MED ORDER — MONTELUKAST SODIUM 10 MG PO TABS: 10.0000 mg | ORAL_TABLET | Freq: Every day | ORAL | 5 refills | Status: DC

## 2020-03-03 MED ORDER — CETIRIZINE HCL 10 MG PO TABS: 10.0000 mg | ORAL_TABLET | Freq: Two times a day (BID) | ORAL | 5 refills | Status: DC

## 2020-03-03 MED ORDER — EPINEPHRINE 0.3 MG/0.3ML IJ SOAJ: 0.3000 mg | INTRAMUSCULAR | 1 refills | Status: DC | PRN

## 2020-03-03 NOTE — Telephone Encounter (Signed)
Patient called and said that the other pharmacy mess up her meds about 2 weeks ago and she did not pick it up. She would like for you to sent her zyrtec, singulair, auvi-q to [facility] [phone].

## 2020-03-03 NOTE — Telephone Encounter (Signed)
Refills sent in and called patient to notify. Tried to explained that Stefanie Taylor goes to a specialty pharmacy called [facility]. She said she will call us back because she had another incoming call she had to take.

## 2020-03-06 ENCOUNTER — Encounter: Payer: Self-pay | Admitting: Allergy & Immunology

## 2020-03-06 ENCOUNTER — Other Ambulatory Visit: Payer: Self-pay

## 2020-03-06 ENCOUNTER — Encounter: Payer: Self-pay | Admitting: Family Medicine

## 2020-03-06 ENCOUNTER — Ambulatory Visit: Payer: Medicare PPO | Admitting: Family Medicine

## 2020-03-06 VITALS — BP 110/70 | HR 66 | Temp 97.3°F | Resp 15 | Ht 65.0 in | Wt 241.0 lb

## 2020-03-06 DIAGNOSIS — Z780 Asymptomatic menopausal state: Secondary | ICD-10-CM | POA: Diagnosis not present

## 2020-03-06 DIAGNOSIS — J3089 Other allergic rhinitis: Secondary | ICD-10-CM

## 2020-03-06 DIAGNOSIS — Z23 Encounter for immunization: Secondary | ICD-10-CM

## 2020-03-06 DIAGNOSIS — M25561 Pain in right knee: Secondary | ICD-10-CM | POA: Diagnosis not present

## 2020-03-06 DIAGNOSIS — G8929 Other chronic pain: Secondary | ICD-10-CM

## 2020-03-06 DIAGNOSIS — E559 Vitamin D deficiency, unspecified: Secondary | ICD-10-CM

## 2020-03-06 NOTE — Assessment & Plan Note (Signed)
Uncontrolled pain and swelling, refer [facility]

## 2020-03-06 NOTE — Progress Notes (Signed)
   Stefanie Taylor     MRN: [id number]      DOB: 1956-04-04   HPI Stefanie Taylor is here for follow up and re-evaluation of chronic medical conditions, medication management and review of any available recent lab and radiology data.  Preventive health is updated, specifically  Cancer screening and Immunization.   Questions or concerns regarding consultations or procedures which the PT has had in the interim are  addressed. The PT denies any adverse reactions to current medications since the last visit.  6 month h/o increased and uncontrolled right knee pain ,and swelling wanrts referral to [facility], has heard of plasma injection C/o weight regain and increased eating since May, also states she is drinking wine for stress relief ROS Denies recent fever or chills. Denies sinus pressure, nasal congestion, ear pain or sore throat. Denies chest congestion, productive cough or wheezing. Denies chest pains, palpitations and leg swelling Denies abdominal pain, nausea, vomiting,diarrhea or constipation.   Denies dysuria, frequency, hesitancy or incontinence.  Denies headaches, seizures, numbness, or tingling. Denies depression, anxiety or insomnia. Denies skin break down or rash.   PE  BP 110/70   Pulse 66   Temp (!) 97.3 F (36.3 C) (Temporal)   Resp 15   Ht 5\' 5"  (1.651 m)   Wt 241 lb (109.3 kg)   SpO2 98%   BMI 40.10 kg/m    Patient alert and oriented and in no cardiopulmonary distress.  HEENT: No facial asymmetry, EOMI,     Neck supple .  Chest: Clear to auscultation bilaterally.  CVS: S1, S2 no murmurs, no S3.Regular rate.  ABD: Soft non tender.   Ext: No edema  MS: Adequate ROM spine, shoulders, hips and reduced in  Knees.rigth greater than left  Skin: Intact, no ulcerations or rash noted.  Psych: Good eye contact, normal affect. Memory intact not anxious or depressed appearing.  CNS: CN 2-12 intact, power,  normal throughout.no focal deficits noted.   Assessment  & Plan  Knee pain, right Uncontrolled pain and swelling, refer [facility]  Allergic rhinitis Managed through allergid with oral meds and epi pen for as needed use  Morbid obesity, Weight - 324, BMI - 53.9.  Patient re-educated about  the importance of commitment to a  minimum of 150 minutes of exercise per week as able.  The importance of healthy food choices with portion control discussed, as well as eating regularly and within a 12 hour window most days. The need to choose "clean , green" food 50 to 75% of the time is discussed, as well as to make water the primary drink and set a goal of 64 ounces water daily.    Weight /BMI 03/06/2020 12/25/2012 12/07/2012  WEIGHT 241 lb 307 lb 6.4 oz 324 lb 6.4 oz  HEIGHT 5\' 5"  5\' 5"  5\' 5"   BMI 40.1 kg/m2 51.15 kg/m2 53.98 kg/m2  Some encounter information is confidential and restricted. Go to Review Flowsheets activity to see all data.      Need for vaccination against Streptococcus pneumoniae After obtaining informed consent, the vaccine is  administered , with no adverse effect noted at the time of administration.

## 2020-03-06 NOTE — Assessment & Plan Note (Signed)
Managed through allergid with oral meds and epi pen for as needed use

## 2020-03-06 NOTE — Assessment & Plan Note (Signed)
After obtaining informed consent, the vaccine is  administered , with no adverse effect noted at the time of administration.  

## 2020-03-06 NOTE — Assessment & Plan Note (Signed)
  Patient re-educated about  the importance of commitment to a  minimum of 150 minutes of exercise per week as able.  The importance of healthy food choices with portion control discussed, as well as eating regularly and within a 12 hour window most days. The need to choose "clean , green" food 50 to 75% of the time is discussed, as well as to make water the primary drink and set a goal of 64 ounces water daily.    Weight /BMI 03/06/2020 12/25/2012 12/07/2012  WEIGHT 241 lb 307 lb 6.4 oz 324 lb 6.4 oz  HEIGHT 5\' 5"  5\' 5"  5\' 5"   BMI 40.1 kg/m2 51.15 kg/m2 53.98 kg/m2  Some encounter information is confidential and restricted. Go to Review Flowsheets activity to see all data.

## 2020-03-06 NOTE — Patient Instructions (Addendum)
Follow-up in office with MD in 6 months call if you need me sooner.  Welcome to medicare visit to be scheduled within the first 12 months of patient being on medicare, please schedule  Pneumonia 23 vaccine in the office today.  Appointment for bone density scan to be scheduled at checkout.  You are referred to [facility] orthopedics and and the soonest available appointment will be obtained really your right knee.  Please get fasting labs in the next 1 week CBC lipid panel CMP and EGFR TSH and vitamin D levels.  We will send to gynecology for your most recent Pap smear.  Please work on 10 pound weight loss over the next 6 months by close attention to food intake in particular.  It is important that you exercise regularly at least 30 minutes 5 times a week. If you develop chest pain, have severe difficulty breathing, or feel very tired, stop exercising immediately and seek medical attention  Think about what you will eat, plan ahead. Choose " clean, green, fresh or frozen" over canned, processed or packaged foods which are more sugary, salty and fatty. 70 to 75% of food eaten should be vegetables and fruit. Three meals at set times with snacks allowed between meals, but they must be fruit or vegetables. Aim to eat over a 12 hour period , example 7 am to 7 pm, and STOP after  your last meal of the day. Drink water,generally about 64 ounces per day, no other drink is as healthy. Fruit juice is best enjoyed in a healthy way, by EATING the fruit. Thanks for choosing [facility], we consider it a privelige to serve you.

## 2020-03-11 ENCOUNTER — Encounter: Payer: Self-pay | Admitting: Allergy & Immunology

## 2020-03-13 ENCOUNTER — Ambulatory Visit (HOSPITAL_COMMUNITY)
Admission: RE | Admit: 2020-03-13 | Discharge: 2020-03-13 | Disposition: A | Discharge status: Home / Self Care | Payer: Medicare PPO | Source: Ambulatory Visit | Attending: Family Medicine | Admitting: Family Medicine

## 2020-03-13 ENCOUNTER — Other Ambulatory Visit: Payer: Self-pay

## 2020-03-13 DIAGNOSIS — Z780 Asymptomatic menopausal state: Secondary | ICD-10-CM | POA: Diagnosis not present

## 2020-03-13 LAB — LIPID PANEL
Cholesterol: 131 mg/dL (ref <200)
HDL: 62 mg/dL (ref >=50)
LDL Cholesterol (Calc): 57 mg/dL (calc)
Non-HDL Cholesterol (Calc): 69 mg/dL (calc) (ref <130)
Total CHOL/HDL Ratio: 2.1 (calc) (ref <5.0)
Triglycerides: 42 mg/dL (ref <150)

## 2020-03-13 LAB — COMPLETE METABOLIC PANEL WITH GFR
AG Ratio: 1.7 (calc) (ref 1.0–2.5)
ALT: 21 U/L (ref 6–29)
AST: 24 U/L (ref 10–35)
Albumin: 4.1 g/dL (ref 3.6–5.1)
Alkaline phosphatase (APISO): 74 U/L (ref 37–153)
BUN: 12 mg/dL (ref 7–25)
CO2: 31 mmol/L (ref 20–32)
Calcium: 9.2 mg/dL (ref 8.6–10.4)
Chloride: 104 mmol/L (ref 98–110)
Creat: 0.67 mg/dL (ref 0.50–0.99)
GFR, Est African American: 107 mL/min/{1.73_m2} (ref >=60)
GFR, Est Non African American: 92 mL/min/{1.73_m2} (ref >=60)
Globulin: 2.4 g/dL (calc) (ref 1.9–3.7)
Glucose, Bld: 89 mg/dL (ref 65–99)
Potassium: 4.1 mmol/L (ref 3.5–5.3)
Sodium: 139 mmol/L (ref 135–146)
Total Bilirubin: 0.8 mg/dL (ref 0.2–1.2)
Total Protein: 6.5 g/dL (ref 6.1–8.1)

## 2020-03-13 LAB — TSH: TSH: 1.54 mIU/L (ref 0.40–4.50)

## 2020-03-13 LAB — CBC
HCT: 37.9 % (ref 35.0–45.0)
Hemoglobin: 12.7 g/dL (ref 11.7–15.5)
MCH: 29.9 pg (ref 27.0–33.0)
MCHC: 33.5 g/dL (ref 32.0–36.0)
MCV: 89.2 fL (ref 80.0–100.0)
MPV: 12.1 fL (ref 7.5–12.5)
Platelets: 156 10*3/uL (ref 140–400)
RBC: 4.25 10*6/uL (ref 3.80–5.10)
RDW: 12.8 % (ref 11.0–15.0)
WBC: 3.4 10*3/uL — ABNORMAL LOW (ref 3.8–10.8)

## 2020-03-13 LAB — VITAMIN D 25 HYDROXY (VIT D DEFICIENCY, FRACTURES): Vit D, 25-Hydroxy: 28 ng/mL — ABNORMAL LOW (ref 30–100)

## 2020-03-13 MED ORDER — PREDNISONE 10 MG PO TABS: ORAL_TABLET | ORAL | 0 refills | Status: DC

## 2020-03-13 NOTE — Addendum Note (Signed)
Addended by: Valere Dross on: 03/13/2020 06:52 PM   Modules accepted: Orders

## 2020-03-13 NOTE — Telephone Encounter (Signed)
Prednisone wean pended. Can someone contact her to let her know that we are sending in prednisone and confirm where she wants it sent?   Salvatore Marvel, MD [facility] [place]

## 2020-03-13 NOTE — Addendum Note (Signed)
Addended by: Valentina Shaggy on: 03/13/2020 05:28 PM   Modules accepted: Orders

## 2020-03-13 NOTE — Telephone Encounter (Signed)
Prednisone was sent to [facility] on [address] in [place].

## 2020-03-24 ENCOUNTER — Inpatient Hospital Stay: Admission: RE | Admit: 2020-03-24 | Payer: Medicare PPO | Source: Ambulatory Visit

## 2020-03-28 ENCOUNTER — Telehealth: Payer: Self-pay

## 2020-03-28 NOTE — Telephone Encounter (Signed)
Patient called to request an update on the Xolair injections. I did see the note about the approval program. Patient wanted to see when she would be able to come in for her next injection. She does request a mychart message instead of a phone call as she will be in court next week.

## 2020-03-31 NOTE — Telephone Encounter (Signed)
I had L/m for patient two weeks ago to check office for delivery last week and same did come in.  Stefanie Taylor also reached out to patient to advise that her medication had arrived and tried to schedule patient for injection in [place] office and patient was confused why Wed.  I also tried to reach out to patient but no voicemail setup to L/M

## 2020-04-04 ENCOUNTER — Other Ambulatory Visit: Payer: Self-pay

## 2020-04-04 ENCOUNTER — Ambulatory Visit (INDEPENDENT_AMBULATORY_CARE_PROVIDER_SITE_OTHER): Payer: Medicare PPO

## 2020-04-04 DIAGNOSIS — L501 Idiopathic urticaria: Secondary | ICD-10-CM | POA: Diagnosis not present

## 2020-04-25 ENCOUNTER — Other Ambulatory Visit: Payer: Self-pay

## 2020-04-25 ENCOUNTER — Ambulatory Visit
Admission: RE | Admit: 2020-04-25 | Discharge: 2020-04-25 | Disposition: A | Discharge status: Home / Self Care | Payer: Medicare PPO | Source: Ambulatory Visit | Attending: Family Medicine | Admitting: Family Medicine

## 2020-04-25 DIAGNOSIS — Z1231 Encounter for screening mammogram for malignant neoplasm of breast: Secondary | ICD-10-CM | POA: Diagnosis not present

## 2020-04-30 ENCOUNTER — Ambulatory Visit (INDEPENDENT_AMBULATORY_CARE_PROVIDER_SITE_OTHER): Payer: Medicare PPO

## 2020-04-30 ENCOUNTER — Other Ambulatory Visit: Payer: Self-pay

## 2020-04-30 DIAGNOSIS — L501 Idiopathic urticaria: Secondary | ICD-10-CM | POA: Diagnosis not present

## 2020-05-05 ENCOUNTER — Ambulatory Visit: Payer: BC Managed Care – PPO | Admitting: Family Medicine

## 2020-05-28 ENCOUNTER — Ambulatory Visit: Payer: Self-pay

## 2020-05-29 ENCOUNTER — Other Ambulatory Visit: Payer: Self-pay | Admitting: Obstetrics & Gynecology

## 2020-05-29 DIAGNOSIS — Z01411 Encounter for gynecological examination (general) (routine) with abnormal findings: Secondary | ICD-10-CM | POA: Diagnosis not present

## 2020-05-29 DIAGNOSIS — Z6841 Body Mass Index (BMI) 40.0 and over, adult: Secondary | ICD-10-CM | POA: Diagnosis not present

## 2020-05-29 DIAGNOSIS — Z124 Encounter for screening for malignant neoplasm of cervix: Secondary | ICD-10-CM | POA: Diagnosis not present

## 2020-06-10 ENCOUNTER — Telehealth: Payer: Self-pay | Admitting: *Deleted

## 2020-06-10 NOTE — Telephone Encounter (Signed)
Patient called yesterday and spoke with Janett Billow in the [place] shot room and wanted to make an appointment to get her Xolair Tuesday 06/10/20 in [place]. She told Janett Billow that her medication was in [place] and in [place]. I advised to patient that her medication is in [place] only and that she can either be scheduled to get an injection in [place] on Wednesday 06/11/20 or in [place] on Thursday 06/12/20 so that I can have her medicine brought over from [place]. I advised available times and patient declined available times and requested to "cancel" this call. She no-showed her Xolair appointment in [place] on 05/28/2020. Call ended.

## 2020-07-02 ENCOUNTER — Other Ambulatory Visit: Payer: Self-pay

## 2020-07-02 ENCOUNTER — Ambulatory Visit (INDEPENDENT_AMBULATORY_CARE_PROVIDER_SITE_OTHER): Payer: Medicare PPO

## 2020-07-02 DIAGNOSIS — L501 Idiopathic urticaria: Secondary | ICD-10-CM | POA: Diagnosis not present

## 2020-07-30 ENCOUNTER — Other Ambulatory Visit: Payer: Self-pay

## 2020-07-30 ENCOUNTER — Ambulatory Visit (INDEPENDENT_AMBULATORY_CARE_PROVIDER_SITE_OTHER): Payer: Medicare PPO

## 2020-07-30 DIAGNOSIS — L501 Idiopathic urticaria: Secondary | ICD-10-CM | POA: Diagnosis not present

## 2020-08-27 ENCOUNTER — Other Ambulatory Visit: Payer: Self-pay

## 2020-08-27 ENCOUNTER — Ambulatory Visit (INDEPENDENT_AMBULATORY_CARE_PROVIDER_SITE_OTHER): Payer: Medicare PPO

## 2020-08-27 DIAGNOSIS — L501 Idiopathic urticaria: Secondary | ICD-10-CM | POA: Diagnosis not present

## 2020-08-29 ENCOUNTER — Ambulatory Visit: Payer: Self-pay

## 2020-08-30 ENCOUNTER — Encounter: Payer: Self-pay | Admitting: Allergy & Immunology

## 2020-09-01 ENCOUNTER — Other Ambulatory Visit: Payer: Self-pay

## 2020-09-01 MED ORDER — PREDNISONE 10 MG PO TABS: ORAL_TABLET | ORAL | 0 refills | Status: DC

## 2020-09-02 ENCOUNTER — Other Ambulatory Visit: Payer: Self-pay

## 2020-09-02 ENCOUNTER — Ambulatory Visit (INDEPENDENT_AMBULATORY_CARE_PROVIDER_SITE_OTHER): Payer: Medicare PPO | Admitting: Family Medicine

## 2020-09-02 ENCOUNTER — Encounter: Payer: Self-pay | Admitting: Family Medicine

## 2020-09-02 VITALS — BP 120/80 | HR 72 | Resp 14 | Ht 65.0 in | Wt 245.1 lb

## 2020-09-02 DIAGNOSIS — Z0000 Encounter for general adult medical examination without abnormal findings: Secondary | ICD-10-CM | POA: Diagnosis not present

## 2020-09-02 DIAGNOSIS — Z1211 Encounter for screening for malignant neoplasm of colon: Secondary | ICD-10-CM

## 2020-09-02 DIAGNOSIS — R21 Rash and other nonspecific skin eruption: Secondary | ICD-10-CM | POA: Insufficient documentation

## 2020-09-02 DIAGNOSIS — Z23 Encounter for immunization: Secondary | ICD-10-CM

## 2020-09-02 LAB — POCT URINALYSIS DIP (CLINITEK)
Bilirubin, UA: NEGATIVE
Blood, UA: NEGATIVE
Glucose, UA: NEGATIVE mg/dL
Ketones, POC UA: NEGATIVE mg/dL
Leukocytes, UA: NEGATIVE
Nitrite, UA: NEGATIVE
POC PROTEIN,UA: NEGATIVE
Spec Grav, UA: 1.015 (ref 1.010–1.025)
Urobilinogen, UA: 0.2 E.U./dL
pH, UA: 6.5 (ref 5.0–8.0)

## 2020-09-02 NOTE — Assessment & Plan Note (Addendum)
Approx 12 month h/o generalized itch hyperpigmented rashes on forearms at wrists right  worse than left, right thigh and upper buttocks, no new products, hydrocortisone of no relief , requests referral

## 2020-09-02 NOTE — Progress Notes (Signed)
Subjective:    Stefanie Taylor is a 64 y.o. female who presents for a Welcome to Medicare exam.   Review of Systems  Cardiac Risk Factors include: advanced age (>57men, >50 women);sedentary lifestyle      Objective:    Today's Vitals   09/02/20 0852 09/02/20 0900  BP: 111/75   Pulse: 60   Resp: 14   SpO2: 98%   Weight: 245 lb 1.9 oz (111.2 kg)   Height: 5\' 5"  (1.651 m)   PainSc: 0-No pain 0-No pain  Body mass index is 40.79 kg/m.  Medications Outpatient Encounter Medications as of 09/02/2020  Medication Sig   cetirizine (ZYRTEC) 10 MG tablet Take 1 tablet (10 mg total) by mouth 2 (two) times daily.   EPINEPHrine (AUVI-Q) 0.3 mg/0.3 mL IJ SOAJ injection Inject 0.3 mLs (0.3 mg total) into the muscle as needed for anaphylaxis.   montelukast (SINGULAIR) 10 MG tablet Take 1 tablet (10 mg total) by mouth at bedtime.   predniSONE (DELTASONE) 10 MG tablet Take two tablets (20mg ) twice daily for three days, then one tablet (10mg ) twice daily for three days, then STOP.   [DISCONTINUED] RESTASIS 0.05 % ophthalmic emulsion INT 1 GTT IN OU BID   Facility-Administered Encounter Medications as of 09/02/2020  Medication   omalizumab Arvid Right) injection 300 mg     History: Past Medical History:  Diagnosis Date   H/O sickle cell trait    Hypertension    Morbid obesity with BMI of 50.0-59.9, adult ([place]) 08/15/12   BMI: 53.1 kg/m^2   Past Surgical History:  Procedure Laterality Date   BIOPSY BREAST     BREAST BIOPSY     BREAST EXCISIONAL BIOPSY Right    BREAST SURGERY  1998   biopsy mole on right breast   BREATH TEK H PYLORI  09/04/2012   Procedure: BREATH TEK H PYLORI;  Surgeon: Shann Medal, MD;  Location: [facility];  Service: General;  Laterality: N/A;   BREATH TEK H PYLORI N/A 11/06/2012   Procedure: Lauris Chroman;  Surgeon: Shann Medal, MD;  Location: [facility];  Service: General;  Laterality: N/A;   COLONOSCOPY WITH PROPOFOL N/A 05/28/2016    Procedure: COLONOSCOPY WITH PROPOFOL;  Surgeon: Carol Ada, MD;  Location: [facility];  Service: Endoscopy;  Laterality: N/A;   GASTRIC BYPASS  09/07/2017   TUBAL LIGATION     UMBILICAL HERNIA REPAIR     age 36    Family History  Problem Relation Age of Onset   Hypertension Mother    Heart disease Mother    Cancer Mother    Hypertension Father    Hypertension Sister    Alcohol abuse Sister    Diabetes Brother    Hypertension Brother    Diabetes Sister    Hypertension Sister    Diabetes Sister    Hypertension Sister    Diabetes Brother    Hypertension Brother    Diabetes Brother    Hypertension Brother    Diabetes Brother    Hypertension Brother    Hypertension Brother    Breast cancer Neg Hx    Allergic rhinitis Neg Hx    Asthma Neg Hx    Eczema Neg Hx    Urticaria Neg Hx    Social History   Occupational History   Not on file  Tobacco Use   Smoking status: Never Smoker   Smokeless tobacco: Never Used  Vaping Use   Vaping Use: Never used  Substance and Sexual  Activity   Alcohol use: No    Comment: wine- occ.   Drug use: No   Sexual activity: Yes    Birth control/protection: Surgical    Tobacco Counseling Counseling given: Not Answered   Immunizations and Health Maintenance Immunization History  Administered Date(s) Administered   Influenza,inj,Quad PF,6+ Mos 09/04/2015, 07/14/2016, 07/20/2018, 05/03/2019   PFIZER SARS-COV-2 Vaccination 11/05/2019, 11/26/2019   Pneumococcal Conjugate-13 04/29/2014   Pneumococcal Polysaccharide-23 03/06/2020   Tdap 02/12/2014   Zoster 10/31/2014   Zoster Recombinat (Shingrix) 09/22/2018, 01/03/2019   Health Maintenance Due  Topic Date Due   PAP SMEAR-Modifier  02/18/2018   INFLUENZA VACCINE  04/13/2020   COVID-19 Vaccine (3 - Booster for [place] series) 05/28/2020    Activities of Daily Living In your present state of health, do you have any difficulty performing  the following activities: 09/02/2020  Hearing? N  Vision? N  Difficulty concentrating or making decisions? N  Walking or climbing stairs? Y  Dressing or bathing? N  Doing errands, shopping? N  Preparing Food and eating ? N  Using the Toilet? N  In the past six months, have you accidently leaked urine? N  Do you have problems with loss of bowel control? N  Managing your Medications? N  Managing your Finances? N  Housekeeping or managing your Housekeeping? N  Some recent data might be hidden    Physical Exam  (optional), or other factors deemed appropriate based on the beneficiary's medical and social history and current clinical standards. BP 120/80    Pulse 72    Resp 14    Ht 5\' 5"  (1.651 m)    Wt 245 lb 1.9 oz (111.2 kg)    SpO2 98%    BMI 40.79 kg/m   Advanced Directives:  yes, has written advaned directives, need copy for record, will let her know    Assessment:    This is a routine wellness examination for this patient  Rash and nonspecific skin eruption Approx 12 month h/o generalized itch hyperpigmented rashes on forearms at wrists right  worse than left, right thigh and upper buttocks, no new products, hydrocortisone of no relief , requests referral   Welcome to Medicare preventive visit Welcome to medicare as documented. Counseling done  re healthy lifestyle involving commitment to 150 minutes exercise per week, heart healthy diet, and attaining healthy weight.The importance of adequate sleep also discussed. Regular seat belt use and home safety, is also discussed. Changes in health habits are decided on by the patient with goals and time frames  set for achieving them. Immunization and cancer screening needs are specifically addressed at this visit. EKG: Sinus bradycardia, no ischemia, no LVH    Vision/Hearing screen  Hearing Screening   125Hz  250Hz  500Hz  1000Hz  2000Hz  3000Hz  4000Hz  6000Hz  8000Hz   Right ear:           Left ear:             Visual Acuity  Screening   Right eye Left eye Both eyes  Without correction:     With correction: 20/25 20/25 20/25     Dietary issues and exercise activities discussed:  Current Exercise Habits: The patient does not participate in regular exercise at present, Exercise limited by: None identified  Goals     Increase physical activity     Walk 5 days per week     Patient Stated     To retire      Depression Screen PHQ 2/9 Scores 09/02/2020  PHQ -  2 Score 0  Some encounter information is confidential and restricted. Go to Review Flowsheets activity to see all data.     Fall Risk Fall Risk  09/02/2020  Falls in the past year? 0  Number falls in past yr: 0  Injury with Fall? 0  Some encounter information is confidential and restricted. Go to Review Flowsheets activity to see all data.    Cognitive Function:     6CIT Screen 09/02/2020  What Year? 0 points  What month? 0 points  What time? 0 points  Count back from 20 0 points  Months in reverse 2 points  Repeat phrase 0 points  Total Score 2    Patient Care Team: Fayrene Helper, MD as PCP - General (Family Medicine) Josue Hector, MD as Attending Physician (Cardiology) Himmelrich, Bryson Ha, RD (Inactive) as Dietitian ([place])     Plan:     I have personally reviewed and noted the following in the patients chart:    Medical and social history  Use of alcohol, tobacco or illicit drugs   Current medications and supplements  Functional ability and status  Nutritional status  Physical activity  Advanced directives  List of other physicians  Hospitalizations, surgeries, and ER visits in previous 12 months  Vitals  Screenings to include cognitive, depression, and falls  Referrals and appointments  In addition, I have reviewed and discussed with patient certain preventive protocols, quality metrics, and best practice recommendations. A written personalized care plan for preventive services as well as  general preventive health recommendations were provided to patient.     Kate Sable, LPN, LPN [id number]

## 2020-09-02 NOTE — Patient Instructions (Signed)
F/u in office in 6 months, call if you need me sooner  Flu vaccine today  EKG and CCUA today  Nurse to arrange cologuard test  Work on regular exercise and 10 pound weight loss in next 6 months  You are referred to [facility] dermatology and bariatric surgery  Living will to be readily accessible, will let you know if need to get this scanned in  To your medical record, good that you have one  Please get fasting cBC, lipid, cmp and eGFr, tSH and vit D 1 week before next visit  Commit to vitamin D3 2000 IU once daily  Think about what you will eat, plan ahead. Choose " clean, green, fresh or frozen" over canned, processed or packaged foods which are more sugary, salty and fatty. 70 to 75% of food eaten should be vegetables and fruit. Three meals at set times with snacks allowed between meals, but they must be fruit or vegetables. Aim to eat over a 12 hour period , example 7 am to 7 pm, and STOP after  your last meal of the day. Drink water,generally about 64 ounces per day, no other drink is as healthy. Fruit juice is best enjoyed in a healthy way, by EATING the fruit. Thanks for choosing [facility], we consider it a privelige to serve you.

## 2020-09-07 ENCOUNTER — Encounter: Payer: Self-pay | Admitting: Family Medicine

## 2020-09-07 DIAGNOSIS — Z0000 Encounter for general adult medical examination without abnormal findings: Secondary | ICD-10-CM | POA: Insufficient documentation

## 2020-09-07 NOTE — Progress Notes (Signed)
Preventive Screening-Counseling & Management   Patient present here today for a welcome to medicare  wellness visit.   Current Problems (verified)   Medications Prior to Visit Allergies (verified)   PAST HISTORY  Family History  Social History    Risk Factors  Current exercise habits:    Dietary issues discussed:   Cardiac risk factors:   Depression Screen  (Note: if answer to either of the following is "Yes", a more complete depression screening is indicated)   Over the past two weeks, have you felt down, depressed or hopeless? No  Over the past two weeks, have you felt little interest or pleasure in doing things? No  Have you lost interest or pleasure in daily life? No  Do you often feel hopeless? No  Do you cry easily over simple problems? No   Activities of Daily Living  In your present state of health, do you have any difficulty performing the following activities?  Driving?: No Managing money?: No Feeding yourself?:No Getting from bed to chair?:No Climbing a flight of stairs?:No Preparing food and eating?:No Bathing or showering?:No Getting dressed?:No Getting to the toilet?:No Using the toilet?:No Moving around from place to place?: No  Fall Risk Assessment In the past year have you fallen or had a near fall?:No Are you currently taking any medications that make you dizziness?:No   Hearing Difficulties: No Do you often ask people to speak up or repeat themselves?:No Do you experience ringing or noises in your ears?:No Do you have difficulty understanding soft or whispered voices?:No  Cognitive Testing  Alert? Yes Normal Appearance?Yes  Oriented to person? Yes Place? Yes  Time? Yes  Displays appropriate judgment?Yes  Can read the correct time from a watch face? yes Are you having problems remembering things?No  Advanced Directives have been discussed with the patient?Yes    List the Names of Other Physician/Practitioners you currently use:     Indicate any recent Medical Services you may have received from other than [facility] providers in the past year (date may be approximate).     Medicare Attestation  I have personally reviewed:  The patient's medical and social history  Their use of alcohol, tobacco or illicit drugs  Their current medications and supplements  The patient's functional ability including ADLs,fall risks, home safety risks, cognitive, and hearing and visual impairment  Diet and physical activities  Evidence for depression or mood disorders  The patient's weight, height, BMI, and visual acuity have been recorded in the chart. I have made referrals, counseling, and provided education to the patient based on review of the above and I have provided the patient with a written personalized care plan for preventive services.    Physical Exam BP 120/80   Pulse 72   Resp 14   Ht 5\' 5"  (1.651 m)   Wt 245 lb 1.9 oz (111.2 kg)   SpO2 98%   BMI 40.79 kg/m    Assessment & Plan:  .pobap

## 2020-09-07 NOTE — Assessment & Plan Note (Addendum)
Welcome to [employer] as documented. Counseling done  re healthy lifestyle involving commitment to 150 minutes exercise per week, heart healthy diet, and attaining healthy weight.The importance of adequate sleep also discussed. Regular seat belt use and home safety, is also discussed. Changes in health habits are decided on by the patient with goals and time frames  set for achieving them. Immunization and cancer screening needs are specifically addressed at this visit. EKG: Sinus bradycardia, no ischemia, no LVH

## 2020-09-22 ENCOUNTER — Other Ambulatory Visit: Payer: Self-pay | Admitting: Allergy & Immunology

## 2020-09-23 DIAGNOSIS — H35033 Hypertensive retinopathy, bilateral: Secondary | ICD-10-CM | POA: Diagnosis not present

## 2020-09-23 DIAGNOSIS — H25813 Combined forms of age-related cataract, bilateral: Secondary | ICD-10-CM | POA: Diagnosis not present

## 2020-09-23 DIAGNOSIS — H02831 Dermatochalasis of right upper eyelid: Secondary | ICD-10-CM | POA: Diagnosis not present

## 2020-09-23 DIAGNOSIS — H16223 Keratoconjunctivitis sicca, not specified as Sjogren's, bilateral: Secondary | ICD-10-CM | POA: Diagnosis not present

## 2020-09-23 DIAGNOSIS — Z1211 Encounter for screening for malignant neoplasm of colon: Secondary | ICD-10-CM | POA: Diagnosis not present

## 2020-09-23 LAB — COLOGUARD: Cologuard: NEGATIVE

## 2020-09-24 ENCOUNTER — Ambulatory Visit (INDEPENDENT_AMBULATORY_CARE_PROVIDER_SITE_OTHER): Payer: Medicare PPO

## 2020-09-24 ENCOUNTER — Other Ambulatory Visit: Payer: Self-pay

## 2020-09-24 DIAGNOSIS — L501 Idiopathic urticaria: Secondary | ICD-10-CM | POA: Diagnosis not present

## 2020-10-06 LAB — COLOGUARD: COLOGUARD: NEGATIVE

## 2020-10-24 ENCOUNTER — Ambulatory Visit: Payer: Medicare PPO | Admitting: Family

## 2020-10-24 ENCOUNTER — Other Ambulatory Visit: Payer: Self-pay

## 2020-10-24 ENCOUNTER — Ambulatory Visit (INDEPENDENT_AMBULATORY_CARE_PROVIDER_SITE_OTHER): Payer: Medicare PPO

## 2020-10-24 ENCOUNTER — Encounter: Payer: Self-pay | Admitting: Family

## 2020-10-24 VITALS — BP 130/76 | HR 64 | Temp 96.6°F | Resp 14 | Ht 60.5 in | Wt 245.0 lb

## 2020-10-24 DIAGNOSIS — L501 Idiopathic urticaria: Secondary | ICD-10-CM

## 2020-10-24 DIAGNOSIS — L2089 Other atopic dermatitis: Secondary | ICD-10-CM

## 2020-10-24 DIAGNOSIS — T783XXD Angioneurotic edema, subsequent encounter: Secondary | ICD-10-CM

## 2020-10-24 MED ORDER — TRIAMCINOLONE ACETONIDE 0.1 % EX OINT: TOPICAL_OINTMENT | CUTANEOUS | 3 refills | Status: AC

## 2020-10-24 MED ORDER — EPINEPHRINE 0.3 MG/0.3ML IJ SOAJ: 0.3000 mg | INTRAMUSCULAR | 1 refills | Status: DC | PRN

## 2020-10-24 NOTE — Patient Instructions (Addendum)
Angioedema with urticaria Continue Xolair 300 mg every 4 weeks We will send in a prescription for epinephrine autoinjector device Start Zyrtec 10 mg once a day Continue Singulair 10 mg once a day   Flexural atopic dermatitis Continue daily moisturizing program Start triamcinolone 0.1% ointment using an application twice a day as needed to red itchy areas.  Do not use on face, neck, groin or armpit region Continue to avoid wheat, rice, and sugar.  Consider lab work in the future if able to come off [facility] for 3 months.  Please let us know if this treatment plan is not working well for you Schedule a follow-up appointment in months 3 months

## 2020-10-24 NOTE — Progress Notes (Signed)
[place], [place] [address] Dept: [phone]  FOLLOW UP NOTE  Patient ID: Stefanie Taylor, female    DOB: February 15, 1956  Age: 65 y.o. MRN: [id number] Date of Office Visit: 10/24/2020  Assessment  Chief Complaint: Urticaria (Would like lab work done to see what she is allergic to. Says that the skin test didn't show much when she did it before covid. Not sure the exact year. Prefers not to do skin testing again. Would rather have labs completed due to frequent breakouts. ) and Eczema (Says she has a development of eczema and the allergies make it flare up.)  HPI Stefanie Taylor 65 year old female who presents today for an acute visit.  She was last seen on Feb 08, 2020 by Dr. Ernst Bowler for angioedema with urticaria and flexural atopic dermatitis.  Angioedema with urticaria is reported as moderately controlled with Xolair every 4 weeks and Singulair 10 mg once a day.  She reports that she has had one episode in December where she had swelling of her eyes and face.  She was given prednisone by Dr. Ernst Bowler and this helped.  She is currently not been taking Zyrtec, but is going to restart this.  Flexural atopic dermatitis is reported as not well controlled with Emu soap/cream, over-the-counter hydrocortisone cream, coconut oils, and Goldbond lotion.  She has tried triamcinolone cream in the past and this did not help.  She is willing to try triamcinolone ointment.  Drug Allergies:  Allergies  Allergen Reactions  . Elemental Sulfur   . Lasix [Furosemide] Itching and Swelling  . Sulfamethoxazole Itching  . Ibuprofen Itching and Rash  . Penicillins Rash    Review of Systems: Review of Systems  Constitutional: Negative for chills and fever.  HENT:       Denies rhinorrhea, nasal congestion, and postnasal drip  Eyes:       Denies itchy watery eyes  Respiratory: Negative for cough, shortness of breath and wheezing.   Cardiovascular: Negative for chest pain and  palpitations.  Gastrointestinal: Negative for abdominal pain and heartburn.  Genitourinary: Negative for dysuria.  Skin: Positive for itching and rash.       Rash due to eczema  Neurological: Negative for headaches.    Physical Exam: BP 130/76   Pulse 64   Temp (!) 96.6 F (35.9 C)   Resp 14   Ht 5' 0.5" (1.537 m)   Wt 245 lb (111.1 kg)   SpO2 95%   BMI 47.06 kg/m    Physical Exam Constitutional:      Appearance: Normal appearance.  HENT:     Head: Normocephalic and atraumatic.     Comments: Pharynx normal, eyes normal, ears normal, nose normal    Right Ear: Tympanic membrane, ear canal and external ear normal.     Left Ear: Tympanic membrane, ear canal and external ear normal.     Nose: Nose normal.     Mouth/Throat:     Mouth: Mucous membranes are moist.     Pharynx: Oropharynx is clear.  Eyes:     Conjunctiva/sclera: Conjunctivae normal.  Cardiovascular:     Rate and Rhythm: Regular rhythm.     Heart sounds: Normal heart sounds.  Pulmonary:     Effort: Pulmonary effort is normal.     Breath sounds: Normal breath sounds.     Comments: Lungs clear to auscultation Musculoskeletal:     Cervical back: Neck supple.  Skin:    General: Skin is warm.  Comments: Eczematous lesions noted on right forearm, left hand, her back, and buttocks.  She reports eczema also on lower legs.  Neurological:     Mental Status: She is alert and oriented to person, place, and time.  Psychiatric:        Mood and Affect: Mood normal.        Behavior: Behavior normal.        Thought Content: Thought content normal.        Judgment: Judgment normal.     Diagnostics: None  Assessment and Plan: 1. Flexural atopic dermatitis   2. Angioedema, subsequent encounter   3. Chronic idiopathic urticaria     No orders of the defined types were placed in this encounter.   Patient Instructions  Angioedema with urticaria Continue Xolair 300 mg every 4 weeks We will send in a  prescription for epinephrine autoinjector device Start Zyrtec 10 mg once a day Continue Singulair 10 mg once a day   Flexural atopic dermatitis Continue daily moisturizing program Start triamcinolone 0.1% ointment using an application twice a day as needed to red itchy areas.  Do not use on face, neck, groin or armpit region Continue to avoid wheat, rice, and sugar.  Consider lab work in the future if able to come off [place] for 3 months.  Please let us know if this treatment plan is not working well for you Schedule a follow-up appointment in months 3 months   Return in about 3 months (around 01/21/2021), or if symptoms worsen or fail to improve.    Thank you for the opportunity to care for this patient.  Please do not hesitate to contact me with questions.  Althea Charon, FNP [facility]

## 2020-11-19 ENCOUNTER — Ambulatory Visit (INDEPENDENT_AMBULATORY_CARE_PROVIDER_SITE_OTHER): Payer: Medicare PPO

## 2020-11-19 ENCOUNTER — Other Ambulatory Visit: Payer: Self-pay

## 2020-11-19 DIAGNOSIS — L501 Idiopathic urticaria: Secondary | ICD-10-CM | POA: Diagnosis not present

## 2020-12-01 DIAGNOSIS — H18821 Corneal disorder due to contact lens, right eye: Secondary | ICD-10-CM | POA: Diagnosis not present

## 2020-12-01 DIAGNOSIS — H16223 Keratoconjunctivitis sicca, not specified as Sjogren's, bilateral: Secondary | ICD-10-CM | POA: Diagnosis not present

## 2020-12-17 ENCOUNTER — Ambulatory Visit (INDEPENDENT_AMBULATORY_CARE_PROVIDER_SITE_OTHER): Payer: Medicare PPO

## 2020-12-17 ENCOUNTER — Other Ambulatory Visit: Payer: Self-pay

## 2020-12-17 DIAGNOSIS — L501 Idiopathic urticaria: Secondary | ICD-10-CM

## 2021-01-14 ENCOUNTER — Other Ambulatory Visit: Payer: Self-pay

## 2021-01-14 ENCOUNTER — Ambulatory Visit (INDEPENDENT_AMBULATORY_CARE_PROVIDER_SITE_OTHER): Payer: Medicare PPO

## 2021-01-14 ENCOUNTER — Ambulatory Visit: Payer: Self-pay

## 2021-01-14 DIAGNOSIS — L501 Idiopathic urticaria: Secondary | ICD-10-CM | POA: Diagnosis not present

## 2021-01-21 ENCOUNTER — Ambulatory Visit: Payer: Medicare PPO | Admitting: Allergy & Immunology

## 2021-02-11 ENCOUNTER — Ambulatory Visit (INDEPENDENT_AMBULATORY_CARE_PROVIDER_SITE_OTHER): Payer: Medicare PPO | Admitting: Allergy & Immunology

## 2021-02-11 ENCOUNTER — Encounter: Payer: Self-pay | Admitting: Allergy & Immunology

## 2021-02-11 ENCOUNTER — Other Ambulatory Visit: Payer: Self-pay | Admitting: Allergy & Immunology

## 2021-02-11 ENCOUNTER — Other Ambulatory Visit: Payer: Self-pay

## 2021-02-11 ENCOUNTER — Ambulatory Visit: Payer: Self-pay

## 2021-02-11 VITALS — BP 132/70 | HR 66 | Temp 97.8°F | Resp 16 | Ht 65.0 in

## 2021-02-11 DIAGNOSIS — L2089 Other atopic dermatitis: Secondary | ICD-10-CM

## 2021-02-11 DIAGNOSIS — L501 Idiopathic urticaria: Secondary | ICD-10-CM | POA: Diagnosis not present

## 2021-02-11 MED ORDER — EPINEPHRINE 0.3 MG/0.3ML IJ SOAJ: 0.3000 mg | INTRAMUSCULAR | 1 refills | Status: DC | PRN

## 2021-02-11 MED ORDER — CETIRIZINE HCL 10 MG PO TABS: 10.0000 mg | ORAL_TABLET | Freq: Two times a day (BID) | ORAL | 5 refills | Status: DC

## 2021-02-11 MED ORDER — TRIAMCINOLONE ACETONIDE 0.1 % EX OINT: 1.0000 "application " | TOPICAL_OINTMENT | Freq: Two times a day (BID) | CUTANEOUS | 3 refills | Status: DC | PRN

## 2021-02-11 MED ORDER — MONTELUKAST SODIUM 10 MG PO TABS: 10.0000 mg | ORAL_TABLET | Freq: Every day | ORAL | 5 refills | Status: DC

## 2021-02-11 NOTE — Patient Instructions (Signed)
1. Angioedema - unknown trigger with normal lab workup - Continue with Xolair every four weeks. - We can talk about weaning this at the next visit.  - In the meantime, continue with suppressive dosing of antihistamines:              - Morning: Zyrtec (cetirizine) 10-20mg  (one or two tablets)              - Evening: Zyrtec (cetirizine) 10-20mg  (one or two tablets) + Singulair (montelukast) 10mg    2. No follow-ups on file.    Please inform us of any Emergency Department visits, hospitalizations, or changes in symptoms. Call us before going to the ED for breathing or allergy symptoms since we might be able to fit you in for a sick visit. Feel free to contact us anytime with any questions, problems, or concerns.  It was a pleasure to see you again today!  Websites that have reliable patient information: 1. American Academy of Asthma, Allergy, and Immunology: www.aaaai.org 2. Food Allergy Research and Education (FARE): foodallergy.org 3. Mothers of Asthmatics: http://www.asthmacommunitynetwork.org 4. American College of Allergy, Asthma, and Immunology: www.acaai.org   COVID-19 Vaccine Information can be found at: ShippingScam.co.uk For questions related to vaccine distribution or appointments, please email vaccine@ .com or call [phone].     "Like" [place] on Facebook and Instagram for our latest updates!       Make sure you are registered to vote! If you have moved or changed any of your contact information, you will need to get this updated before voting!  In some cases, you MAY be able to register to vote online: [email or web address]

## 2021-02-11 NOTE — Progress Notes (Signed)
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 Angioedema - unknown trigger with normal lab workup - Continue with Xolair every four weeks. - We can talk about weaning this at the next visit.  - In the meantime, continue with suppressive dosing of antihistamines:              - Morning: Zyrtec (cetirizine) 10-20mg               - Evening: Zyrtec (cetirizine) 10-20mg  + Singulair (montelukast) 10mg    2. Atopic dermatitis - Continue with triamcinolone 0.1% ointment twice daily as needed source areas. - Continue to moisturize with Aveeno as you are doing. - OK to continue your trial and error to figure out any food triggers.  3. Follow up in one year or earlier if needed.    Subjective:   Stefanie Taylor is a 65 y.o. female presenting today for follow up of  Chief Complaint  Patient presents with  . Chronic idiopathic urticaria    Stefanie Taylor has a history of the following: Patient Active Problem List   Diagnosis Date Noted  . Welcome to Medicare preventive visit 09/07/2020  . Rash and nonspecific skin eruption 09/02/2020  . Need for vaccination against Streptococcus pneumoniae 03/06/2020  . Allergic reaction 02/19/2019  . Poor urinary stream 07/20/2018  . Osteoarthritis 03/13/2015  . Allergic rhinitis 08/24/2013  . Morbid obesity, Weight - 324, BMI - 53.9. 08/02/2012  . Knee pain, right 03/02/2012    History obtained from: chart review and patient.  Stefanie Taylor is a 65 y.o. female presenting for a follow up visit.  She was last seen in February 2022.  At that time, we continued her on Xolair every 4 weeks.  We started Zyrtec once a day and continue with Singulair.  For her atopic dermatitis, we started her on triamcinolone.  Since last visit, she has generally done well.     She is on the Xolair  and is tolerating those without a problem.  She has not had any issues with the injection.  She does not think that she needs to increase to every 2 weeks, although we have given her this option in the past.  In general, she is in a much better place than she was before she started the Xolair. The swelling episodes are all resolved.  She continues to play around with her diet.  She has concerns with foods causing eczema flares.  We discussed doing testing in the past, but while being on Xolair he cannot really do skin testing agency.  She would have to be on Xolair for 3 months if she wanted to do skin testing.  Even then, doing skin testing for eczema flares is typically not all that enlightening.    Therefore, she has just been playing around with her foods.  She did have beans and rice on Sunday. This was white rice and she actually had a triggering of the aggravation of her eczema. She is going to try brown rice and see how that does. She also noticed that [employer] made her eczema flare. 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  She has been using her triamcinolone ointment.  She does need a refill of this.  She moisturizes with Aveeno.  She is retiring. 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 She is planning to stop drinking coffee. She is planning to visit [place] as well as the [employer] as well as [place]. She is going to [place] next week.  She travels mostly either with friends or her daughter.  She tells me her husband is not adventurous and not a risk taker.  Otherwise, there have been no changes to her past medical history, surgical history, family history, or social history.    Review of Systems  Constitutional: Negative.  Negative for chills, fever, malaise/fatigue and weight loss.  HENT: Negative.  Negative for congestion, ear discharge and ear pain.   Eyes: Negative for pain, discharge and redness.   Respiratory: Negative for cough, sputum production, shortness of breath and wheezing.   Cardiovascular: Negative.  Negative for chest pain and palpitations.  Gastrointestinal: Negative for abdominal pain, constipation, diarrhea, heartburn, nausea and vomiting.  Skin: Positive for rash. Negative for itching.  Neurological: Negative for dizziness and headaches.  Endo/Heme/Allergies: Negative for environmental allergies. Does not bruise/bleed easily.       Objective:   Blood pressure 132/70, pulse 66, temperature 97.8 F (36.6 C), temperature source Temporal, resp. rate 16, height 5\' 5"  (1.651 m), SpO2 97 %. Body mass index is 40.77 kg/m.    Physical Exam:  Physical Exam Constitutional:      Appearance: She is well-developed.     Comments: Wearing a white outfit.  HENT:     Head: Normocephalic and atraumatic.     Right Ear: Tympanic membrane, ear canal and external ear normal.     Left Ear: Tympanic membrane, ear canal and external ear normal.     Nose: No nasal deformity, septal deviation, mucosal edema or rhinorrhea.     Right Turbinates: Enlarged and swollen.     Left Turbinates: Enlarged and swollen.     Right Sinus: No maxillary sinus tenderness or frontal sinus tenderness.     Left Sinus: No maxillary sinus tenderness or frontal sinus tenderness.     Mouth/Throat:     Mouth: Mucous membranes are not pale and not dry.     Pharynx: Uvula midline.  Eyes:     General:        Right eye: No discharge.        Left eye: No discharge.     Conjunctiva/sclera: Conjunctivae normal.     Right eye: Right conjunctiva is not injected. No chemosis.    Left eye: Left conjunctiva is not injected. No chemosis.    Pupils: Pupils are equal, round, and reactive to light.  Cardiovascular:     Rate and Rhythm: Normal rate and regular rhythm.     Heart sounds: Normal heart sounds.  Pulmonary:     Effort: Pulmonary effort is normal. No tachypnea, accessory muscle usage or respiratory  distress.     Breath sounds: Normal breath sounds. No wheezing, rhonchi or rales.     Comments: Moving air well in all lung fields. No increased work of breathing noted.  Chest:     Chest wall: No tenderness.  Lymphadenopathy:     Cervical: No cervical adenopathy.  Skin:    General: Skin is warm.     Capillary Refill: Capillary refill takes less than 2 seconds.     Coloration: Skin is not pale.     Findings: No abrasion, erythema, petechiae or rash. Rash is not papular, urticarial or vesicular.     Comments: No eczematous or urticarial lesions noted.  Neurological:     Mental Status: She is alert.  Psychiatric:        Behavior: Behavior is cooperative.      Diagnostic studies: none      Salvatore Marvel, MD  [facility]

## 2021-02-20 ENCOUNTER — Telehealth: Payer: Self-pay | Admitting: Allergy & Immunology

## 2021-02-20 ENCOUNTER — Other Ambulatory Visit: Payer: Self-pay | Admitting: *Deleted

## 2021-02-20 MED ORDER — EPINEPHRINE 0.3 MG/0.3ML IJ SOAJ: 0.3000 mg | Freq: Once | INTRAMUSCULAR | 1 refills | Status: AC

## 2021-02-20 NOTE — Telephone Encounter (Signed)
[employer] called stating a prior authorizaion would be needed for patient's AUVI Q, stated patient is on [place] through [facility]. Pharmacist did provide number to reach [facility] for prior authorization. [phone]  Please advise.

## 2021-02-20 NOTE — Telephone Encounter (Signed)
Patient's insurance prefers generic EpiPen device. This has been sent in to [facility]. Attempted to call patient but the number was unavailable at this time.

## 2021-02-23 NOTE — Telephone Encounter (Signed)
Called and spoke with the patient and advised of the generic epipen being sent in. Patient verbalized understanding.

## 2021-03-04 ENCOUNTER — Encounter: Payer: Self-pay | Admitting: Family Medicine

## 2021-03-04 ENCOUNTER — Ambulatory Visit: Payer: Medicare PPO | Admitting: Family Medicine

## 2021-03-04 ENCOUNTER — Other Ambulatory Visit: Payer: Self-pay

## 2021-03-04 VITALS — BP 122/82 | HR 60 | Temp 97.8°F | Resp 20 | Ht 65.0 in | Wt 242.0 lb

## 2021-03-04 DIAGNOSIS — M25571 Pain in right ankle and joints of right foot: Secondary | ICD-10-CM | POA: Diagnosis not present

## 2021-03-04 DIAGNOSIS — Z13220 Encounter for screening for lipoid disorders: Secondary | ICD-10-CM | POA: Diagnosis not present

## 2021-03-04 DIAGNOSIS — Z09 Encounter for follow-up examination after completed treatment for conditions other than malignant neoplasm: Secondary | ICD-10-CM

## 2021-03-04 DIAGNOSIS — E559 Vitamin D deficiency, unspecified: Secondary | ICD-10-CM | POA: Diagnosis not present

## 2021-03-04 DIAGNOSIS — R600 Localized edema: Secondary | ICD-10-CM | POA: Diagnosis not present

## 2021-03-04 DIAGNOSIS — I872 Venous insufficiency (chronic) (peripheral): Secondary | ICD-10-CM

## 2021-03-04 DIAGNOSIS — J3089 Other allergic rhinitis: Secondary | ICD-10-CM

## 2021-03-04 DIAGNOSIS — M25551 Pain in right hip: Secondary | ICD-10-CM | POA: Insufficient documentation

## 2021-03-04 DIAGNOSIS — M79671 Pain in right foot: Secondary | ICD-10-CM

## 2021-03-04 DIAGNOSIS — G8929 Other chronic pain: Secondary | ICD-10-CM

## 2021-03-04 DIAGNOSIS — T7840XA Allergy, unspecified, initial encounter: Secondary | ICD-10-CM

## 2021-03-04 MED ORDER — PREDNISONE 5 MG (21) PO TBPK: 5.0000 mg | ORAL_TABLET | ORAL | 0 refills | Status: DC

## 2021-03-04 NOTE — Assessment & Plan Note (Addendum)
Aprrox 2 months, awakens pt, no specific aggravating injury, refer Ortho

## 2021-03-04 NOTE — Progress Notes (Signed)
Stefanie Taylor     MRN: [id number]      DOB: [birth year]   HPI Stefanie Taylor is here for follow up and re-evaluation of chronic medical conditions, medication management and review of any available recent lab and radiology data.  Preventive health is updated, specifically  Cancer screening and Immunization.  Needs immunization for upcoming travel to [place] The PT denies any adverse reactions to current medications since the last visit.  2 month h/o right ankle pain and swelling up to an 8, almost every day, Also painful area under right foot  Right hip pain limiting mobility, up to an 8, for approx 2 months, awakens pt  Travelling, needs tetanus, yellow fever , typhoid , malaria tabs and pred for as needed  Needs script printed for bilateral compression hose There are no specific complaints   ROS Denies recent fever or chills. Denies sinus pressure, nasal congestion, ear pain or sore throat. Denies chest congestion, productive cough or wheezing. Denies chest pains, palpitations PND or orthopnea. C/o bilateral leg swelling and requests compression hose. Denies abdominal pain, nausea, vomiting,diarrhea or constipation.   Denies dysuria, frequency, hesitancy or incontinence.  Denies headaches, seizures, numbness, or tingling. Denies depression, anxiety or insomnia. Denies skin break down or rash.   PE  BP 122/82 (BP Location: Right Arm, Patient Position: Sitting, Cuff Size: Large)   Pulse 60   Temp 97.8 F (36.6 C)   Resp 20   Ht 5\' 5"  (1.651 m)   Wt 242 lb (109.8 kg)   SpO2 97%   BMI 40.27 kg/m   Patient alert and oriented and in no cardiopulmonary distress.  HEENT: No facial asymmetry, EOMI,     Neck supple .  Chest: Clear to auscultation bilaterally.  CVS: S1, S2 no murmurs, no S3.Regular rate.  ABD: Soft non tender.   Ext: No edema  MS: Adequate ROM spine, decreased ROM right hip and ankle with ankle swelling and deformity Skin: Intact, no ulcerations or  rash noted.Tender nodule under right foot  Psych: Good eye contact, normal affect. Memory intact not anxious or depressed appearing.  CNS: CN 2-12 intact, power,  normal throughout.no focal deficits noted.   Assessment & Plan  Right hip pain Aprrox 2 months, awakens pt, no specific aggravating injury, refer Ortho  Chronic pain of right ankle Debilitating pain and swelling of right ankle, progressively worsening, Ortho to eval nad manage  Bilateral leg edema Script for bilateral knee high compression hose provided  Venous insufficiency of both lower extremities Knee high com[pression hose prescribed  Allergic rhinitis Controlled, no change in medication   Allergic reaction H/o urticaria and angioedema, prednisone prescribed for travel for emegency use if needed  Morbid obesity, Weight - 324, BMI - 53.9.  Patient re-educated about  the importance of commitment to a  minimum of 150 minutes of exercise per week as able.  The importance of healthy food choices with portion control discussed, as well as eating regularly and within a 12 hour window most days. The need to choose "clean , green" food 50 to 75% of the time is discussed, as well as to make water the primary drink and set a goal of 64 ounces water daily.    Weight /BMI 03/04/2021 02/11/2021 10/24/2020  WEIGHT 242 lb - 245 lb  HEIGHT 5\' 5"  5\' 5"  5' .5"  BMI 40.27 kg/m2 40.77 kg/m2 47.06 kg/m2  Some encounter information is confidential and restricted. Go to Review Flowsheets activity to see all data.  Chronic foot pain, right Painful nodule of right foot refer to Podiatry  Need for immunization follow-up Directed to travel clinic either in [place] or [place] for immunization and medication needs for upcoming travel to [place], had planned to admin Td but none available

## 2021-03-04 NOTE — Patient Instructions (Addendum)
Follow-up in 4 months call if you need me sooner.  Please call the office near the end of August for your flu vaccine prior to departure it should be available for your age group at that time.  Td today in office.( Not available)  We will [facility] has  the vaccines you need to travel specifically yellow fever and typhoid as well as malaria tablets and get back in touch with you.  Prednisone is prescribed in case you need them for an y unexpected allergic reaction  A printed prescription for compression hose 15-19 will be  handed to you at the time of checkout.  You are referred to Orthopedics for your right hip and ankle pain.      You are to referred to podiatry be your right foot pain.  Labs today cBC, lipid, cmp and eGFR, tSH, Vit d

## 2021-03-05 LAB — LIPID PANEL
Chol/HDL Ratio: 2.8 ratio (ref 0.0–4.4)
Cholesterol, Total: 142 mg/dL (ref 100–199)
HDL: 51 mg/dL (ref >39)
LDL Chol Calc (NIH): 80 mg/dL (ref 0–99)
Triglycerides: 49 mg/dL (ref 0–149)
VLDL Cholesterol Cal: 11 mg/dL (ref 5–40)

## 2021-03-05 LAB — CBC
Hematocrit: 39.7 % (ref 34.0–46.6)
Hemoglobin: 13.1 g/dL (ref 11.1–15.9)
MCH: 29.2 pg (ref 26.6–33.0)
MCHC: 33 g/dL (ref 31.5–35.7)
MCV: 88 fL (ref 79–97)
Platelets: 171 10*3/uL (ref 150–450)
RBC: 4.49 x10E6/uL (ref 3.77–5.28)
RDW: 12.2 % (ref 11.7–15.4)
WBC: 3 10*3/uL — ABNORMAL LOW (ref 3.4–10.8)

## 2021-03-05 LAB — CMP14+EGFR
ALT: 30 IU/L (ref 0–32)
AST: 31 IU/L (ref 0–40)
Albumin/Globulin Ratio: 1.5 (ref 1.2–2.2)
Albumin: 4.1 g/dL (ref 3.8–4.8)
Alkaline Phosphatase: 91 IU/L (ref 44–121)
BUN/Creatinine Ratio: 19 (ref 12–28)
BUN: 11 mg/dL (ref 8–27)
Bilirubin Total: 1.1 mg/dL (ref 0.0–1.2)
CO2: 25 mmol/L (ref 20–29)
Calcium: 9.4 mg/dL (ref 8.7–10.3)
Chloride: 102 mmol/L (ref 96–106)
Creatinine, Ser: 0.58 mg/dL (ref 0.57–1.00)
Globulin, Total: 2.7 g/dL (ref 1.5–4.5)
Glucose: 87 mg/dL (ref 65–99)
Potassium: 4.3 mmol/L (ref 3.5–5.2)
Sodium: 145 mmol/L — ABNORMAL HIGH (ref 134–144)
Total Protein: 6.8 g/dL (ref 6.0–8.5)
eGFR: 100 mL/min/{1.73_m2} (ref >59)

## 2021-03-05 LAB — VITAMIN D 25 HYDROXY (VIT D DEFICIENCY, FRACTURES): Vit D, 25-Hydroxy: 38.2 ng/mL (ref 30.0–100.0)

## 2021-03-05 LAB — TSH: TSH: 1.76 u[IU]/mL (ref 0.450–4.500)

## 2021-03-08 ENCOUNTER — Encounter: Payer: Self-pay | Admitting: Family Medicine

## 2021-03-08 DIAGNOSIS — Z09 Encounter for follow-up examination after completed treatment for conditions other than malignant neoplasm: Secondary | ICD-10-CM | POA: Insufficient documentation

## 2021-03-08 DIAGNOSIS — G8929 Other chronic pain: Secondary | ICD-10-CM | POA: Insufficient documentation

## 2021-03-08 DIAGNOSIS — I872 Venous insufficiency (chronic) (peripheral): Secondary | ICD-10-CM | POA: Insufficient documentation

## 2021-03-08 NOTE — Assessment & Plan Note (Signed)
Directed to travel clinic either in [place] or [place] for immunization and medication needs for upcoming travel to [place], had planned to admin Td but none available

## 2021-03-08 NOTE — Assessment & Plan Note (Signed)
Painful nodule of right foot refer to Podiatry

## 2021-03-08 NOTE — Assessment & Plan Note (Signed)
Knee high com[pression hose prescribed

## 2021-03-08 NOTE — Assessment & Plan Note (Signed)
Debilitating pain and swelling of right ankle, progressively worsening, Ortho to eval nad manage

## 2021-03-08 NOTE — Assessment & Plan Note (Signed)
  Patient re-educated about  the importance of commitment to a  minimum of 150 minutes of exercise per week as able.  The importance of healthy food choices with portion control discussed, as well as eating regularly and within a 12 hour window most days. The need to choose "clean , green" food 50 to 75% of the time is discussed, as well as to make water the primary drink and set a goal of 64 ounces water daily.    Weight /BMI 03/04/2021 02/11/2021 10/24/2020  WEIGHT 242 lb - 245 lb  HEIGHT 5\' 5"  5\' 5"  5' .5"  BMI 40.27 kg/m2 40.77 kg/m2 47.06 kg/m2  Some encounter information is confidential and restricted. Go to Review Flowsheets activity to see all data.

## 2021-03-08 NOTE — Assessment & Plan Note (Signed)
Controlled, no change in medication  

## 2021-03-08 NOTE — Assessment & Plan Note (Signed)
H/o urticaria and angioedema, prednisone prescribed for travel for emegency use if needed

## 2021-03-08 NOTE — Assessment & Plan Note (Signed)
Script for bilateral knee high compression hose provided

## 2021-03-09 ENCOUNTER — Encounter: Payer: Self-pay | Admitting: Family Medicine

## 2021-03-09 ENCOUNTER — Other Ambulatory Visit: Payer: Self-pay

## 2021-03-09 ENCOUNTER — Telehealth (INDEPENDENT_AMBULATORY_CARE_PROVIDER_SITE_OTHER): Payer: Medicare PPO | Admitting: Family Medicine

## 2021-03-09 DIAGNOSIS — R110 Nausea: Secondary | ICD-10-CM

## 2021-03-09 NOTE — Telephone Encounter (Signed)
Pt made a virtual appt with simpson 03-09-21

## 2021-03-11 ENCOUNTER — Other Ambulatory Visit: Payer: Self-pay

## 2021-03-11 ENCOUNTER — Ambulatory Visit (INDEPENDENT_AMBULATORY_CARE_PROVIDER_SITE_OTHER): Payer: Medicare PPO

## 2021-03-11 DIAGNOSIS — L501 Idiopathic urticaria: Secondary | ICD-10-CM | POA: Diagnosis not present

## 2021-03-12 ENCOUNTER — Encounter: Payer: Self-pay | Admitting: Podiatry

## 2021-03-12 ENCOUNTER — Ambulatory Visit: Payer: Medicare PPO | Admitting: Podiatry

## 2021-03-12 ENCOUNTER — Ambulatory Visit (INDEPENDENT_AMBULATORY_CARE_PROVIDER_SITE_OTHER): Payer: Medicare PPO

## 2021-03-12 DIAGNOSIS — L84 Corns and callosities: Secondary | ICD-10-CM

## 2021-03-12 DIAGNOSIS — R600 Localized edema: Secondary | ICD-10-CM | POA: Diagnosis not present

## 2021-03-12 DIAGNOSIS — M7751 Other enthesopathy of right foot: Secondary | ICD-10-CM

## 2021-03-12 DIAGNOSIS — Z23 Encounter for immunization: Secondary | ICD-10-CM | POA: Diagnosis not present

## 2021-03-12 NOTE — Progress Notes (Signed)
Subjective:   Patient ID: Stefanie Taylor, female   DOB: 65 y.o.   MRN: [id number]   HPI Patient presents with a lot of discomfort in the right ankle x2 months swelling and states that its been hard for her to be active and she is trying to do more activity and lose weight.  Patient does not smoke   Review of Systems  All other systems reviewed and are negative.      Objective:  Physical Exam Vitals and nursing note reviewed.  Constitutional:      Appearance: She is well-developed.  Pulmonary:     Effort: Pulmonary effort is normal.  Musculoskeletal:        General: Normal range of motion.  Skin:    General: Skin is warm.  Neurological:     Mental Status: She is alert.    Neurovascular status intact muscle strength adequate range of motion adequate with exquisite discomfort around the sinus tarsi right with inflammation fluid and discomfort in the lateral ankle gutter.  There is moderate edema negative Bevelyn Buckles' sign was noted and the edema is not pitting at this time.  Patient has good digital perfusion well oriented and is moderately obese     Assessment:  Inflammatory capsulitis sinus tarsi right fluid buildup around the area with moderate edema present     Plan:  H&P educated her on condition sterile prep injected the sinus tarsi right 3 mg Kenalog 5 mg Xylocaine applied fascial brace to provide stabilization and dispensed ankle compression stocking to help reduce swelling.  Patient will be seen back 4 weeks may require other treatments in future  X-rays indicate that there is some arthritis flatfoot deformity spur formation no indication stress fracture or fracture

## 2021-03-15 NOTE — Progress Notes (Signed)
Appointment with MD canceled, pt reported to Nurse that she did not speak with MD

## 2021-03-17 ENCOUNTER — Other Ambulatory Visit: Payer: Self-pay | Admitting: *Deleted

## 2021-03-17 DIAGNOSIS — M7061 Trochanteric bursitis, right hip: Secondary | ICD-10-CM | POA: Diagnosis not present

## 2021-03-17 MED ORDER — OMALIZUMAB 150 MG/ML ~~LOC~~ SOSY
300.0000 mg | PREFILLED_SYRINGE | SUBCUTANEOUS | 11 refills | Status: DC
Start: 2021-03-17 — End: 2022-03-17

## 2021-03-23 ENCOUNTER — Other Ambulatory Visit: Payer: Self-pay | Admitting: Family Medicine

## 2021-03-23 DIAGNOSIS — Z1231 Encounter for screening mammogram for malignant neoplasm of breast: Secondary | ICD-10-CM

## 2021-04-02 ENCOUNTER — Encounter: Payer: Self-pay | Admitting: Podiatry

## 2021-04-02 ENCOUNTER — Ambulatory Visit: Payer: Medicare PPO | Admitting: Podiatry

## 2021-04-02 ENCOUNTER — Other Ambulatory Visit: Payer: Self-pay

## 2021-04-02 DIAGNOSIS — R600 Localized edema: Secondary | ICD-10-CM | POA: Diagnosis not present

## 2021-04-02 DIAGNOSIS — M7751 Other enthesopathy of right foot: Secondary | ICD-10-CM

## 2021-04-02 NOTE — Progress Notes (Signed)
Subjective:   Patient ID: Stefanie Taylor, female   DOB: 65 y.o.   MRN: [id number]   HPI Patient states she is improving but still having mild discomfort and states the swelling is moderately better with the compression and overall she is very pleased with how things are going near   ROS      Objective:  Physical Exam  Vascular status intact with patient found to have discomfort in the sinus tarsi right but significantly improved moderate swelling in both ankles but improved with utilization of ankle compression stockings     Assessment:  Sinus tarsitis improved right with mild discomfort along with chronic edema which is improved with compression     Plan:  H&P reviewed conditions recommended the continuation of compression stockings elevation as needed do not recommend current injection may be necessary at 1 point in future.  Pleased with how she is doing

## 2021-04-08 ENCOUNTER — Ambulatory Visit (INDEPENDENT_AMBULATORY_CARE_PROVIDER_SITE_OTHER): Payer: Medicare PPO | Admitting: *Deleted

## 2021-04-08 ENCOUNTER — Other Ambulatory Visit: Payer: Self-pay

## 2021-04-08 DIAGNOSIS — L501 Idiopathic urticaria: Secondary | ICD-10-CM

## 2021-04-08 MED ORDER — OMALIZUMAB 150 MG/ML ~~LOC~~ SOSY
300.0000 mg | PREFILLED_SYRINGE | SUBCUTANEOUS | Status: DC
  Administered 2021-04-08 – 2022-03-19 (×11): 300 mg via SUBCUTANEOUS

## 2021-05-08 ENCOUNTER — Other Ambulatory Visit: Payer: Self-pay

## 2021-05-08 ENCOUNTER — Telehealth: Payer: Self-pay

## 2021-05-08 ENCOUNTER — Encounter: Payer: Self-pay | Admitting: *Deleted

## 2021-05-08 ENCOUNTER — Ambulatory Visit (INDEPENDENT_AMBULATORY_CARE_PROVIDER_SITE_OTHER): Payer: Medicare PPO

## 2021-05-08 DIAGNOSIS — L501 Idiopathic urticaria: Secondary | ICD-10-CM

## 2021-05-08 MED ORDER — PREDNISONE 10 MG PO TABS: ORAL_TABLET | ORAL | 0 refills | Status: DC

## 2021-05-08 NOTE — Telephone Encounter (Signed)
Patient is going out of the country and would like a script for prednisone sent it just in case of a flare up.

## 2021-05-08 NOTE — Telephone Encounter (Signed)
Sent in prednisone taper. Please inform patient.   Salvatore Marvel, MD [facility]

## 2021-05-08 NOTE — Addendum Note (Signed)
Addended by: Valentina Shaggy on: 05/08/2021 02:33 PM   Modules accepted: Orders

## 2021-05-08 NOTE — Telephone Encounter (Signed)
Attempted to call patient but there was no answer and was unable to leave a voicemail. I did send a MyChart to the patient as well advising of medication being sent in.

## 2021-05-23 DIAGNOSIS — M545 Low back pain, unspecified: Secondary | ICD-10-CM | POA: Diagnosis not present

## 2021-05-29 DIAGNOSIS — Z01411 Encounter for gynecological examination (general) (routine) with abnormal findings: Secondary | ICD-10-CM | POA: Diagnosis not present

## 2021-05-29 DIAGNOSIS — Z6841 Body Mass Index (BMI) 40.0 and over, adult: Secondary | ICD-10-CM | POA: Diagnosis not present

## 2021-05-29 DIAGNOSIS — Z124 Encounter for screening for malignant neoplasm of cervix: Secondary | ICD-10-CM | POA: Diagnosis not present

## 2021-06-01 ENCOUNTER — Ambulatory Visit
Admission: RE | Admit: 2021-06-01 | Discharge: 2021-06-01 | Disposition: A | Discharge status: Home / Self Care | Payer: Medicare PPO | Source: Ambulatory Visit | Attending: Family Medicine | Admitting: Family Medicine

## 2021-06-01 ENCOUNTER — Other Ambulatory Visit: Payer: Self-pay

## 2021-06-01 DIAGNOSIS — Z1231 Encounter for screening mammogram for malignant neoplasm of breast: Secondary | ICD-10-CM

## 2021-06-05 ENCOUNTER — Ambulatory Visit: Payer: Medicare PPO

## 2021-06-10 ENCOUNTER — Other Ambulatory Visit: Payer: Self-pay

## 2021-06-10 ENCOUNTER — Ambulatory Visit (INDEPENDENT_AMBULATORY_CARE_PROVIDER_SITE_OTHER): Payer: Medicare PPO

## 2021-06-10 DIAGNOSIS — L501 Idiopathic urticaria: Secondary | ICD-10-CM | POA: Diagnosis not present

## 2021-07-08 ENCOUNTER — Ambulatory Visit (INDEPENDENT_AMBULATORY_CARE_PROVIDER_SITE_OTHER): Payer: Medicare PPO

## 2021-07-08 ENCOUNTER — Other Ambulatory Visit: Payer: Self-pay

## 2021-07-08 DIAGNOSIS — L501 Idiopathic urticaria: Secondary | ICD-10-CM | POA: Diagnosis not present

## 2021-08-14 ENCOUNTER — Ambulatory Visit (INDEPENDENT_AMBULATORY_CARE_PROVIDER_SITE_OTHER): Payer: Medicare PPO

## 2021-08-14 ENCOUNTER — Other Ambulatory Visit: Payer: Self-pay

## 2021-08-14 DIAGNOSIS — L501 Idiopathic urticaria: Secondary | ICD-10-CM

## 2021-08-19 ENCOUNTER — Other Ambulatory Visit: Payer: Self-pay | Admitting: Allergy & Immunology

## 2021-08-19 ENCOUNTER — Encounter: Payer: Self-pay | Admitting: Allergy & Immunology

## 2021-08-19 NOTE — Telephone Encounter (Signed)
I called the patient to see why she requested a refill on the prednisone and she stated she sent Dr. Ernst Bowler a message on why she needs it refilled. She told me she was doing well, but is waiting on Dr. Ernst Bowler to message her back/ approve the refill. Please advise.

## 2021-08-20 MED ORDER — HYDROCORTISONE 2.5 % EX OINT: TOPICAL_OINTMENT | Freq: Two times a day (BID) | CUTANEOUS | 2 refills | Status: DC

## 2021-08-20 MED ORDER — PREDNISONE 10 MG PO TABS: ORAL_TABLET | ORAL | 0 refills | Status: DC

## 2021-08-21 MED ORDER — PREDNISONE 10 MG PO TABS: ORAL_TABLET | ORAL | 0 refills | Status: DC

## 2021-09-03 ENCOUNTER — Ambulatory Visit: Payer: Medicare PPO

## 2021-09-03 DIAGNOSIS — Z6837 Body mass index (BMI) 37.0-37.9, adult: Secondary | ICD-10-CM | POA: Diagnosis not present

## 2021-09-03 DIAGNOSIS — Z833 Family history of diabetes mellitus: Secondary | ICD-10-CM | POA: Diagnosis not present

## 2021-09-03 DIAGNOSIS — Z791 Long term (current) use of non-steroidal anti-inflammatories (NSAID): Secondary | ICD-10-CM | POA: Diagnosis not present

## 2021-09-03 DIAGNOSIS — R030 Elevated blood-pressure reading, without diagnosis of hypertension: Secondary | ICD-10-CM | POA: Diagnosis not present

## 2021-09-03 DIAGNOSIS — Z8249 Family history of ischemic heart disease and other diseases of the circulatory system: Secondary | ICD-10-CM | POA: Diagnosis not present

## 2021-09-03 DIAGNOSIS — G8929 Other chronic pain: Secondary | ICD-10-CM | POA: Diagnosis not present

## 2021-09-03 DIAGNOSIS — M1990 Unspecified osteoarthritis, unspecified site: Secondary | ICD-10-CM | POA: Diagnosis not present

## 2021-09-03 DIAGNOSIS — J309 Allergic rhinitis, unspecified: Secondary | ICD-10-CM | POA: Diagnosis not present

## 2021-09-04 ENCOUNTER — Ambulatory Visit: Payer: Medicare PPO

## 2021-09-11 ENCOUNTER — Ambulatory Visit: Payer: Medicare PPO

## 2021-09-15 ENCOUNTER — Ambulatory Visit: Payer: Medicare PPO | Admitting: Family Medicine

## 2021-09-15 ENCOUNTER — Other Ambulatory Visit: Payer: Self-pay

## 2021-09-15 ENCOUNTER — Encounter: Payer: Self-pay | Admitting: Family Medicine

## 2021-09-15 VITALS — BP 127/80 | HR 63 | Ht 64.96 in

## 2021-09-15 DIAGNOSIS — J3089 Other allergic rhinitis: Secondary | ICD-10-CM

## 2021-09-15 DIAGNOSIS — M159 Polyosteoarthritis, unspecified: Secondary | ICD-10-CM | POA: Diagnosis not present

## 2021-09-15 DIAGNOSIS — E559 Vitamin D deficiency, unspecified: Secondary | ICD-10-CM

## 2021-09-15 MED ORDER — CHLORPHENIRAMINE MALEATE 4 MG PO TABS: 4.0000 mg | ORAL_TABLET | Freq: Two times a day (BID) | ORAL | 0 refills | Status: DC | PRN

## 2021-09-15 MED ORDER — CETIRIZINE HCL 10 MG PO TABS: 10.0000 mg | ORAL_TABLET | Freq: Every day | ORAL | 3 refills | Status: DC

## 2021-09-15 MED ORDER — PREDNISONE 10 MG PO TABS: ORAL_TABLET | ORAL | 0 refills | Status: DC

## 2021-09-15 MED ORDER — MONTELUKAST SODIUM 10 MG PO TABS: 10.0000 mg | ORAL_TABLET | Freq: Every day | ORAL | 3 refills | Status: DC

## 2021-09-15 MED ORDER — BENZONATATE 100 MG PO CAPS: 100.0000 mg | ORAL_CAPSULE | Freq: Two times a day (BID) | ORAL | 0 refills | Status: DC | PRN

## 2021-09-15 NOTE — Assessment & Plan Note (Signed)
°  Patient re-educated about  the importance of commitment to a  minimum of 150 minutes of exercise per week as able.  The importance of healthy food choices with portion control discussed, as well as eating regularly and within a 12 hour window most days. The need to choose "clean , green" food 50 to 75% of the time is discussed, as well as to make water the primary drink and set a goal of 64 ounces water daily.    Weight /BMI 09/15/2021 03/04/2021 02/11/2021  WEIGHT - 242 lb -  HEIGHT 5' 4.961" 5\' 5"  5\' 5"   BMI 40.32 kg/m2 40.27 kg/m2 40.77 kg/m2  Some encounter information is confidential and restricted. Go to Review Flowsheets activity to see all data.

## 2021-09-15 NOTE — Progress Notes (Addendum)
° °  Stefanie Taylor     MRN: [id number]      DOB: February 27, 1956   HPI Stefanie Taylor is here for follow up and re-evaluation of chronic medical conditions, medication management and review of any available recent lab and radiology data.  Preventive health is updated, specifically  Cancer screening and Immunization.  All are up to date C/o clear runny nose and cough x several weeks, no fever, chills, ear pain , sore throat or fatigue. [place] of the country next [place] for 1 month Recently on prednisone for facial rash Using OTC vit C for the cough No regular exercise as feet hurt and also poor eating over the holidays, does not wish to weigh today Sent in correction that feet do not hurt. She has had cold symptoms since 12/14, has been unable to exercise/walk as a result of that. Food intake was not excessive over the holidays, the food was not good, so did not wish to weigh because of inability to walk as had cold symptoms I have requested that she let me know of her response to med prescribed at visit if still symptomatic antibiotic course will be prescribed  ROS Denies recent fever or chills. Denies chest pains, palpitations and leg swelling Denies abdominal pain, nausea, vomiting,diarrhea or constipation.   Denies dysuria, frequency, hesitancy or incontinence. . Denies headaches, seizures, numbness, or tingling. Denies depression, anxiety or insomnia. Denies skin break down or rash.   PE  BP 127/80    Pulse 63    Ht 5' 4.96" (1.65 m)    SpO2 95%    BMI 40.32 kg/m   Patient alert and oriented and in no cardiopulmonary distress.  HEENT: No facial asymmetry, EOMI,     Neck supple .No sinus tenderness, no cervical adenopathy  Chest: Clear to auscultation bilaterally.  CVS: S1, S2 no murmurs, no S3.Regular rate.    Ext: No edema  MS: Adequate though reduced ROM spine, shoulders, hips and knees. Psych: Good eye contact, normal affect. Memory intact not anxious or  depressed appearing.  CNS: CN 2-12 intact, power,  normal throughout.no focal deficits noted.   Assessment & Plan  Allergic rhinitis Uncontrolled , commit to daily singulair and zyrtec. Short course of prednisone to start in am Chlorpheniramine 1 to 2 daily for excess congestion Tessalon perles when needed for chest congestion  Morbid obesity, Weight - 324, BMI - 53.9.  Patient re-educated about  the importance of commitment to a  minimum of 150 minutes of exercise per week as able.  The importance of healthy food choices with portion control discussed, as well as eating regularly and within a 12 hour window most days. The need to choose "clean , green" food 50 to 75% of the time is discussed, as well as to make water the primary drink and set a goal of 64 ounces water daily.    Weight /BMI 09/15/2021 03/04/2021 02/11/2021  WEIGHT - 242 lb -  HEIGHT 5' 4.961" 5\' 5"  5\' 5"   BMI 40.32 kg/m2 40.27 kg/m2 40.77 kg/m2  Some encounter information is confidential and restricted. Go to Review Flowsheets activity to see all data.      Osteoarthritis Limits ability to exercise will start back regular exercise as able

## 2021-09-15 NOTE — Assessment & Plan Note (Signed)
Limits ability to exercise will start back regular exercise as able

## 2021-09-15 NOTE — Patient Instructions (Addendum)
F/U in 6 months, call if you need me sooner  For head congestion and cough, START PREDNISONE  TOMORRROW, SHORT COURSE,   Commit to daily singulair and zyrtec effective today  For chest congestion, tessalon perles are prescribed  For runny nose, or increased drainage take chlorpheniramine one  to two daily, start this daily  Symptoms are due to allergy, no s/s of infection  Labs fasting tomorrow, CBC and diff, cmp and EGFr, Vit D  It is important that you exercise regularly at least 30 minutes 5 times a week. If you develop chest pain, have severe difficulty breathing, or feel very tired, stop exercising immediately and seek medical attention   All the best for 2023!  Thanks for choosing [facility], we consider it a privelige to serve you.

## 2021-09-15 NOTE — Assessment & Plan Note (Signed)
Uncontrolled , commit to daily singulair and zyrtec. Short course of prednisone to start in am Chlorpheniramine 1 to 2 daily for excess congestion Tessalon perles when needed for chest congestion

## 2021-09-16 ENCOUNTER — Ambulatory Visit (INDEPENDENT_AMBULATORY_CARE_PROVIDER_SITE_OTHER): Payer: Medicare PPO

## 2021-09-16 ENCOUNTER — Encounter: Payer: Self-pay | Admitting: Family Medicine

## 2021-09-16 DIAGNOSIS — E559 Vitamin D deficiency, unspecified: Secondary | ICD-10-CM | POA: Diagnosis not present

## 2021-09-16 DIAGNOSIS — L501 Idiopathic urticaria: Secondary | ICD-10-CM | POA: Diagnosis not present

## 2021-09-17 ENCOUNTER — Other Ambulatory Visit: Payer: Self-pay

## 2021-09-17 ENCOUNTER — Ambulatory Visit (INDEPENDENT_AMBULATORY_CARE_PROVIDER_SITE_OTHER): Payer: Medicare PPO

## 2021-09-17 ENCOUNTER — Other Ambulatory Visit: Payer: Self-pay | Admitting: Family Medicine

## 2021-09-17 DIAGNOSIS — Z0000 Encounter for general adult medical examination without abnormal findings: Secondary | ICD-10-CM | POA: Diagnosis not present

## 2021-09-17 LAB — CMP14+EGFR
ALT: 24 IU/L (ref 0–32)
AST: 27 IU/L (ref 0–40)
Albumin/Globulin Ratio: 1.7 (ref 1.2–2.2)
Albumin: 3.7 g/dL — ABNORMAL LOW (ref 3.8–4.8)
Alkaline Phosphatase: 106 IU/L (ref 44–121)
BUN/Creatinine Ratio: 19 (ref 12–28)
BUN: 11 mg/dL (ref 8–27)
Bilirubin Total: 0.6 mg/dL (ref 0.0–1.2)
CO2: 26 mmol/L (ref 20–29)
Calcium: 8.9 mg/dL (ref 8.7–10.3)
Chloride: 103 mmol/L (ref 96–106)
Creatinine, Ser: 0.59 mg/dL (ref 0.57–1.00)
Globulin, Total: 2.2 g/dL (ref 1.5–4.5)
Glucose: 86 mg/dL (ref 70–99)
Potassium: 4 mmol/L (ref 3.5–5.2)
Sodium: 140 mmol/L (ref 134–144)
Total Protein: 5.9 g/dL — ABNORMAL LOW (ref 6.0–8.5)
eGFR: 99 mL/min/{1.73_m2} (ref >59)

## 2021-09-17 LAB — CBC WITH DIFFERENTIAL/PLATELET
Basophils Absolute: 0 10*3/uL (ref 0.0–0.2)
Basos: 0 %
EOS (ABSOLUTE): 0.3 10*3/uL (ref 0.0–0.4)
Eos: 8 %
Hematocrit: 37.2 % (ref 34.0–46.6)
Hemoglobin: 12.2 g/dL (ref 11.1–15.9)
Immature Grans (Abs): 0 10*3/uL (ref 0.0–0.1)
Immature Granulocytes: 0 %
Lymphocytes Absolute: 1.3 10*3/uL (ref 0.7–3.1)
Lymphs: 36 %
MCH: 29 pg (ref 26.6–33.0)
MCHC: 32.8 g/dL (ref 31.5–35.7)
MCV: 89 fL (ref 79–97)
Monocytes Absolute: 0.4 10*3/uL (ref 0.1–0.9)
Monocytes: 12 %
Neutrophils Absolute: 1.6 10*3/uL (ref 1.4–7.0)
Neutrophils: 44 %
Platelets: 175 10*3/uL (ref 150–450)
RBC: 4.2 x10E6/uL (ref 3.77–5.28)
RDW: 13.3 % (ref 11.7–15.4)
WBC: 3.5 10*3/uL (ref 3.4–10.8)

## 2021-09-17 LAB — VITAMIN D 25 HYDROXY (VIT D DEFICIENCY, FRACTURES): Vit D, 25-Hydroxy: 30.4 ng/mL (ref 30.0–100.0)

## 2021-09-17 MED ORDER — ERGOCALCIFEROL 1.25 MG (50000 UT) PO CAPS: 50000.0000 [IU] | ORAL_CAPSULE | ORAL | 2 refills | Status: DC

## 2021-09-17 NOTE — Patient Instructions (Addendum)
Stefanie Taylor , Thank you for taking time to come for your Medicare Wellness Visit. I appreciate your ongoing commitment to your health goals. Please review the following plan we discussed and let me know if I can assist you in the future.   These are the goals we discussed:  Goals      Increase physical activity     Walk 5 days per week     Patient Stated     To retire        This is a list of the screening recommended for you and due dates:  Health Maintenance  Topic Date Due   Mammogram  06/02/2023   Tetanus Vaccine  02/13/2024   Colon Cancer Screening  06/13/2029   Pneumonia Vaccine  Completed   Flu Shot  Completed   DEXA scan (bone density measurement)  Completed   COVID-19 Vaccine  Completed   Hepatitis C Screening: USPSTF Recommendation to screen - Ages 106-79 yo.  Completed   Zoster (Shingles) Vaccine  Completed   HPV Vaccine  Aged Out    Health Maintenance, Female Adopting a healthy lifestyle and getting preventive care are important in promoting health and wellness. Ask your health care provider about: The right schedule for you to have regular tests and exams. Things you can do on your own to prevent diseases and keep yourself healthy. What should I know about diet, weight, and exercise? Eat a healthy diet  Eat a diet that includes plenty of vegetables, fruits, low-fat dairy products, and lean protein. Do not eat a lot of foods that are high in solid fats, added sugars, or sodium. Maintain a healthy weight Body mass index (BMI) is used to identify weight problems. It estimates body fat based on height and weight. Your health care provider can help determine your BMI and help you achieve or maintain a healthy weight. Get regular exercise Get regular exercise. This is one of the most important things you can do for your health. Most adults should: Exercise for at least 150 minutes each week. The exercise should increase your heart rate and make you sweat  (moderate-intensity exercise). Do strengthening exercises at least twice a week. This is in addition to the moderate-intensity exercise. Spend less time sitting. Even light physical activity can be beneficial. Watch cholesterol and blood lipids Have your blood tested for lipids and cholesterol at 67 years of age, then have this test every 5 years. Have your cholesterol levels checked more often if: Your lipid or cholesterol levels are high. You are older than 67 years of age. You are at high risk for heart disease. What should I know about cancer screening? Depending on your health history and family history, you may need to have cancer screening at various ages. This may include screening for: Breast cancer. Cervical cancer. Colorectal cancer. Skin cancer. Lung cancer. What should I know about heart disease, diabetes, and high blood pressure? Blood pressure and heart disease High blood pressure causes heart disease and increases the risk of stroke. This is more likely to develop in people who have high blood pressure readings or are overweight. Have your blood pressure checked: Every 3-5 years if you are 30-64 years of age. Every year if you are 2 years old or older. Diabetes Have regular diabetes screenings. This checks your fasting blood sugar level. Have the screening done: Once every three years after age 37 if you are at a normal weight and have a low risk for diabetes. More often  and at a younger age if you are overweight or have a high risk for diabetes. What should I know about preventing infection? Hepatitis B If you have a higher risk for hepatitis B, you should be screened for this virus. Talk with your health care provider to find out if you are at risk for hepatitis B infection. Hepatitis C Testing is recommended for: Everyone born from [birth year] through 1966. Anyone with known risk factors for hepatitis C. Sexually transmitted infections (STIs) Get screened for STIs,  including gonorrhea and chlamydia, if: You are sexually active and are younger than 67 years of age. You are older than 67 years of age and your health care provider tells you that you are at risk for this type of infection. Your sexual activity has changed since you were last screened, and you are at increased risk for chlamydia or gonorrhea. Ask your health care provider if you are at risk. Ask your health care provider about whether you are at high risk for HIV. Your health care provider may recommend a prescription medicine to help prevent HIV infection. If you choose to take medicine to prevent HIV, you should first get tested for HIV. You should then be tested every 3 months for as long as you are taking the medicine. Pregnancy If you are about to stop having your period (premenopausal) and you may become pregnant, seek counseling before you get pregnant. Take 400 to 800 micrograms (mcg) of folic acid every day if you become pregnant. Ask for birth control (contraception) if you want to prevent pregnancy. Osteoporosis and menopause Osteoporosis is a disease in which the bones lose minerals and strength with aging. This can result in bone fractures. If you are 35 years old or older, or if you are at risk for osteoporosis and fractures, ask your health care provider if you should: Be screened for bone loss. Take a calcium or vitamin D supplement to lower your risk of fractures. Be given hormone replacement therapy (HRT) to treat symptoms of menopause. Follow these instructions at home: Alcohol use Do not drink alcohol if: Your health care provider tells you not to drink. You are pregnant, may be pregnant, or are planning to become pregnant. If you drink alcohol: Limit how much you have to: 0-1 drink a day. Know how much alcohol is in your drink. In the [place] one drink equals one 12 oz bottle of beer (355 mL), one 5 oz glass of wine (148 mL), or one 1 oz glass of hard liquor (44  mL). Lifestyle Do not use any products that contain nicotine or tobacco. These products include cigarettes, chewing tobacco, and vaping devices, such as e-cigarettes. If you need help quitting, ask your health care provider. Do not use street drugs. Do not share needles. Ask your health care provider for help if you need support or information about quitting drugs. General instructions Schedule regular health, dental, and eye exams. Stay current with your vaccines. Tell your health care provider if: You often feel depressed. You have ever been abused or do not feel safe at home. Summary Adopting a healthy lifestyle and getting preventive care are important in promoting health and wellness. Follow your health care provider's instructions about healthy diet, exercising, and getting tested or screened for diseases. Follow your health care provider's instructions on monitoring your cholesterol and blood pressure. This information is not intended to replace advice given to you by your health care provider. Make sure you discuss any questions you have with  your health care provider. Document Revised: 01/19/2021 Document Reviewed: 01/19/2021 Elsevier Patient Education  [place].

## 2021-09-17 NOTE — Progress Notes (Signed)
Subjective:   Stefanie Taylor is a 66 y.o. female who presents for Medicare Annual (Subsequent) preventive examination. I connected with  Ruta Hinds Jones-Obeng on 09/17/21 by a audio enabled telemedicine application and verified that I am speaking with the correct person using two identifiers.  Patient Location: Home  Provider Location: Office/Clinic  I discussed the limitations of evaluation and management by telemedicine. The patient expressed understanding and agreed to proceed.  Review of Systems    Defer to PCP Cardiac Risk Factors include: advanced age (>78men, >30 women)     Objective:    Today's Vitals   09/17/21 0911  PainSc: 0-No pain   There is no height or weight on file to calculate BMI.  Advanced Directives 09/17/2021 12/25/2012  Does Patient Have a Medical Advance Directive? Yes Patient has advance directive, copy not in chart  Type of Advance Directive Living will;Healthcare Power of [place];Living will  Some encounter information is confidential and restricted. Go to Review Flowsheets activity to see all data.    Current Medications (verified) Outpatient Encounter Medications as of 09/17/2021  Medication Sig   benzonatate (TESSALON) 100 MG capsule Take 1 capsule (100 mg total) by mouth 2 (two) times daily as needed for cough.   cetirizine (ZYRTEC) 10 MG tablet Take 1 tablet (10 mg total) by mouth daily.   hydrocortisone 2.5 % ointment Apply topically 2 (two) times daily.   montelukast (SINGULAIR) 10 MG tablet Take 1 tablet (10 mg total) by mouth at bedtime.   Multiple Vitamin (MULTIVITAMIN ADULT PO)    omalizumab (XOLAIR) 150 MG/ML prefilled syringe Inject 300 mg into the skin every 28 (twenty-eight) days.   predniSONE (DELTASONE) 10 MG tablet Take two tablets (20mg ) twice daily for three days, then one tablet (10mg ) twice daily for three days, then STOP.   RESTASIS 0.05 % ophthalmic emulsion 1 drop 2 (two) times daily.    triamcinolone 0.1% oint-Eucerin equivalent cream 1:1 mixture Use 1 application twice a day as needed to red itchy areas. Do not use on face, neck, groin, or armpit region   triamcinolone ointment (KENALOG) 0.1 % Apply 1 application topically 2 (two) times daily as needed.   chlorpheniramine (CHLOR-TRIMETON) 4 MG tablet Take 1 tablet (4 mg total) by mouth 2 (two) times daily as needed for allergies. (Patient not taking: Reported on 09/17/2021)   EPINEPHrine (AUVI-Q) 0.3 mg/0.3 mL IJ SOAJ injection Inject 0.3 mg into the muscle as needed for anaphylaxis. (Patient not taking: Reported on 09/17/2021)   predniSONE (DELTASONE) 10 MG tablet Take one tablet by mouth three times daily for 2 days, then twice daily for two days , then once daily for 3 days, then stop (Patient not taking: Reported on 09/17/2021)   [DISCONTINUED] montelukast (SINGULAIR) 10 MG tablet Take 1 tablet (10 mg total) by mouth at bedtime.   Facility-Administered Encounter Medications as of 09/17/2021  Medication   omalizumab Arvid Right) injection 300 mg   omalizumab Arvid Right) prefilled syringe 300 mg    Allergies (verified) Elemental sulfur, Lasix [furosemide], Sulfamethoxazole, Ibuprofen, Latex, and Penicillins   History: Past Medical History:  Diagnosis Date   H/O sickle cell trait    Hypertension    Morbid obesity with BMI of 50.0-59.9, adult ([place]) 08/15/12   BMI: 53.1 kg/m^2   Past Surgical History:  Procedure Laterality Date   BIOPSY BREAST     BREAST BIOPSY     BREAST EXCISIONAL BIOPSY Right    BREAST SURGERY  1998  biopsy mole on right breast   BREATH TEK H PYLORI  09/04/2012   Procedure: BREATH TEK H PYLORI;  Surgeon: Shann Medal, MD;  Location: [facility];  Service: General;  Laterality: N/A;   BREATH TEK H PYLORI N/A 11/06/2012   Procedure: Lauris Chroman;  Surgeon: Shann Medal, MD;  Location: [facility];  Service: General;  Laterality: N/A;   COLONOSCOPY WITH PROPOFOL N/A 05/28/2016   Procedure:  COLONOSCOPY WITH PROPOFOL;  Surgeon: Carol Ada, MD;  Location: [facility];  Service: Endoscopy;  Laterality: N/A;   GASTRIC BYPASS  09/07/2017   TUBAL LIGATION     UMBILICAL HERNIA REPAIR     age 66   Family History  Problem Relation Age of Onset   Hypertension Mother    Heart disease Mother    Cancer Mother    Hypertension Father    Hypertension Sister    Alcohol abuse Sister    Diabetes Brother    Hypertension Brother    Diabetes Sister    Hypertension Sister    Diabetes Sister    Hypertension Sister    Diabetes Brother    Hypertension Brother    Diabetes Brother    Hypertension Brother    Diabetes Brother    Hypertension Brother    Hypertension Brother    Breast cancer Neg Hx    Allergic rhinitis Neg Hx    Asthma Neg Hx    Eczema Neg Hx    Urticaria Neg Hx    Social History   Socioeconomic History   Marital status: Married    Spouse name: Kofi   Number of children: 1   Years of education: 12   Highest education level: Master's degree (e.g., MA, MS, MEng, MEd, MSW, MBA)  Occupational History   Occupation: retired Forensic psychologist  Tobacco Use   Smoking status: Never   Smokeless tobacco: Never  Vaping Use   Vaping Use: Never used  Substance and Sexual Activity   Alcohol use: No    Comment: wine- occ.   Drug use: No   Sexual activity: Yes    Birth control/protection: Surgical    Comment: tubaligation  Other Topics Concern   Not on file  Social History Narrative   Not on file   Social Determinants of Health   Financial Resource Strain: Low Risk    Difficulty of Paying Living Expenses: Not hard at all  Food Insecurity: No Food Insecurity   Worried About Charity fundraiser in the Last Year: Never true   [place] in the Last Year: Never true  Transportation Needs: No Transportation Needs   Lack of Transportation (Medical): No   Lack of Transportation (Non-Medical): No  Physical Activity: Sufficiently Active   Days of Exercise per Week: 4 days    Minutes of Exercise per Session: 80 min  Stress: No Stress Concern Present   Feeling of Stress : Not at all  Social Connections: Socially Integrated   Frequency of Communication with Friends and Family: More than three times a week   Frequency of Social Gatherings with Friends and Family: Once a week   Attends Religious Services: More than 4 times per year   Active Member of [employer] or Organizations: Yes   Attends Music therapist: More than 4 times per year   Marital Status: Married    Tobacco Counseling Counseling given: Not Answered   Clinical Intake:     Pain : No/denies pain Pain Score: 0-No pain  Nutritional Risks: None Diabetes: No  How often do you need to have someone help you when you read instructions, pamphlets, or other written materials from your doctor or pharmacy?: 1 - Never What is the last grade level you completed in school?: 12  Diabetic?no  Interpreter Needed?: No  Information entered by :: [place] of Daily Living In your present state of health, do you have any difficulty performing the following activities: 09/17/2021 09/15/2021  Hearing? N N  Vision? N N  Difficulty concentrating or making decisions? N N  Walking or climbing stairs? Y Y  Dressing or bathing? N N  Doing errands, shopping? N N  Preparing Food and eating ? N N  Using the Toilet? N N  In the past six months, have you accidently leaked urine? N N  Do you have problems with loss of bowel control? N N  Managing your Medications? N N  Managing your Finances? N N  Housekeeping or managing your Housekeeping? N N  Some recent data might be hidden    Patient Care Team: Fayrene Helper, MD as PCP - General (Family Medicine) Josue Hector, MD as Attending Physician (Cardiology) Himmelrich, Bryson Ha, RD (Inactive) as Dietitian (Bariatrics)  Indicate any recent Medical Services you may have received from other than [facility] providers in the past year (date may  be approximate).     Assessment:   This is a routine wellness examination for [place].  Hearing/Vision screen No results found.  Dietary issues and exercise activities discussed: Current Exercise Habits: Home exercise routine, Type of exercise: walking, Time (Minutes): > 60, Frequency (Times/Week): 4, Weekly Exercise (Minutes/Week): 0, Intensity: Moderate   Goals Addressed             This Visit's Progress    Increase physical activity   On track    Walk 5 days per week     Patient Stated   On track    To retire      Depression Screen PHQ 2/9 Scores 09/17/2021 09/15/2021 03/04/2021 09/02/2020  PHQ - 2 Score 0 0 0 0  Some encounter information is confidential and restricted. Go to Review Flowsheets activity to see all data.    Fall Risk Fall Risk  09/17/2021 09/15/2021 09/15/2021 09/15/2021 03/04/2021  Falls in the past year? 0 0 0 0 0  Number falls in past yr: 0 0 0 0 0  Injury with Fall? 0 0 0 0 0  Risk for fall due to : Orthopedic patient - - No Fall Risks No Fall Risks  Follow up Falls evaluation completed - - Falls evaluation completed Falls evaluation completed  Some encounter information is confidential and restricted. Go to Review Flowsheets activity to see all data.    FALL RISK PREVENTION PERTAINING TO THE HOME:  Any stairs in or around the home? Yes  If so, are there any without handrails? Yes  Home free of loose throw rugs in walkways, pet beds, electrical cords, etc? No  Adequate lighting in your home to reduce risk of falls? Yes   ASSISTIVE DEVICES UTILIZED TO PREVENT FALLS:  Life alert? No  Use of a cane, walker or w/c? No  Grab bars in the bathroom? No  Shower chair or bench in shower? Yes  Elevated toilet seat or a handicapped toilet? Yes    Cognitive Function:     6CIT Screen 09/17/2021 09/02/2020  What Year? 0 points 0 points  What month? 0 points 0 points  What  time? 0 points 0 points  Count back from 20 0 points 0 points  Months in reverse 0 points  2 points  Repeat phrase 0 points 0 points  Total Score 0 2    Immunizations Immunization History  Administered Date(s) Administered   Fluad Quad(high Dose 65+) 09/02/2020   Influenza,inj,Quad PF,6+ Mos 09/04/2015, 07/14/2016, 07/20/2018, 05/03/2019   PFIZER(Purple Top)SARS-COV-2 Vaccination 11/05/2019, 11/26/2019, 06/17/2020   Pfizer Covid-19 Vaccine Bivalent Booster 26yrs & up 07/27/2021   Pneumococcal Conjugate-13 04/29/2014   Pneumococcal Polysaccharide-23 03/06/2020   Tdap 02/12/2014   Zoster Recombinat (Shingrix) 09/22/2018, 01/03/2019   Zoster, Live 10/31/2014    TDAP status: Up to date  Flu Vaccine status: Up to date  Pneumococcal vaccine status: Up to date  Covid-19 vaccine status: Information provided on how to obtain vaccines.   Qualifies for Shingles Vaccine? Yes   Zostavax completed No   Shingrix Completed?: Yes  Screening Tests Health Maintenance  Topic Date Due   MAMMOGRAM  06/02/2023   TETANUS/TDAP  02/13/2024   COLONOSCOPY (Pts 45-9yrs Insurance coverage will need to be confirmed)  06/13/2029   Pneumonia Vaccine 51+ Years old  Completed   INFLUENZA VACCINE  Completed   DEXA SCAN  Completed   COVID-19 Vaccine  Completed   Hepatitis C Screening  Completed   Zoster Vaccines- Shingrix  Completed   HPV VACCINES  Aged Out    Health Maintenance  There are no preventive care reminders to display for this patient.  Colorectal cancer screening: Type of screening: Colonoscopy. Completed 06/14/2019. Repeat every 10 years  Mammogram status: Completed 06/01/2021. Repeat every year  Bone Density status: Completed 03/13/2020. Results reflect: Bone density results: NORMAL. Repeat every 2 years.  Lung Cancer Screening: (Low Dose CT Chest recommended if Age 83-80 years, 30 pack-year currently smoking OR have quit w/in 15years.) does not qualify.   Lung Cancer Screening Referral: n/a  Additional Screening:  Hepatitis C Screening: does qualify; Completed  09/02/2015  Vision Screening: Recommended annual ophthalmology exams for early detection of glaucoma and other disorders of the eye. Is the patient up to date with their annual eye exam?  Yes  Who is the provider or what is the name of the office in which the patient attends annual eye exams? [facility] If pt is not established with a provider, would they like to be referred to a provider to establish care? No .   Dental Screening: Recommended annual dental exams for proper oral hygiene  Community Resource Referral / Chronic Care Management: CRR required this visit?  No   CCM required this visit?  No      Plan:     I have personally reviewed and noted the following in the patients chart:   Medical and social history Use of alcohol, tobacco or illicit drugs  Current medications and supplements including opioid prescriptions.  Functional ability and status Nutritional status Physical activity Advanced directives List of other physicians Hospitalizations, surgeries, and ER visits in previous 12 months Vitals Screenings to include cognitive, depression, and falls Referrals and appointments  In addition, I have reviewed and discussed with patient certain preventive protocols, quality metrics, and best practice recommendations. A written personalized care plan for preventive services as well as general preventive health recommendations were provided to patient.     Earline Mayotte, Lead   09/17/2021   Nurse Notes:  Ms. Janota , Thank you for taking time to come for your Medicare Wellness Visit. I appreciate your ongoing commitment to  your health goals. Please review the following plan we discussed and let me know if I can assist you in the future.   These are the goals we discussed:  Goals      Increase physical activity     Walk 5 days per week     Patient Stated     To retire        This is a list of the screening recommended for you and due dates:   Health Maintenance  Topic Date Due   Mammogram  06/02/2023   Tetanus Vaccine  02/13/2024   Colon Cancer Screening  06/13/2029   Pneumonia Vaccine  Completed   Flu Shot  Completed   DEXA scan (bone density measurement)  Completed   COVID-19 Vaccine  Completed   Hepatitis C Screening: USPSTF Recommendation to screen - Ages 13-79 yo.  Completed   Zoster (Shingles) Vaccine  Completed   HPV Vaccine  Aged Out

## 2021-09-18 ENCOUNTER — Ambulatory Visit: Payer: Medicare PPO

## 2021-09-19 DIAGNOSIS — Z20822 Contact with and (suspected) exposure to covid-19: Secondary | ICD-10-CM | POA: Diagnosis not present

## 2021-09-21 ENCOUNTER — Encounter: Payer: Self-pay | Admitting: Family Medicine

## 2021-10-23 ENCOUNTER — Other Ambulatory Visit: Payer: Self-pay

## 2021-10-23 ENCOUNTER — Ambulatory Visit (INDEPENDENT_AMBULATORY_CARE_PROVIDER_SITE_OTHER): Payer: Medicare PPO | Admitting: *Deleted

## 2021-10-23 DIAGNOSIS — L501 Idiopathic urticaria: Secondary | ICD-10-CM

## 2021-11-18 ENCOUNTER — Other Ambulatory Visit: Payer: Self-pay

## 2021-11-18 ENCOUNTER — Ambulatory Visit (INDEPENDENT_AMBULATORY_CARE_PROVIDER_SITE_OTHER): Payer: Medicare PPO

## 2021-11-18 DIAGNOSIS — L501 Idiopathic urticaria: Secondary | ICD-10-CM | POA: Diagnosis not present

## 2021-12-02 DIAGNOSIS — M25552 Pain in left hip: Secondary | ICD-10-CM | POA: Diagnosis not present

## 2021-12-16 ENCOUNTER — Ambulatory Visit (INDEPENDENT_AMBULATORY_CARE_PROVIDER_SITE_OTHER): Payer: Medicare PPO

## 2021-12-16 DIAGNOSIS — L501 Idiopathic urticaria: Secondary | ICD-10-CM

## 2022-01-06 DIAGNOSIS — H2513 Age-related nuclear cataract, bilateral: Secondary | ICD-10-CM | POA: Diagnosis not present

## 2022-01-06 DIAGNOSIS — H35033 Hypertensive retinopathy, bilateral: Secondary | ICD-10-CM | POA: Diagnosis not present

## 2022-01-06 DIAGNOSIS — H04123 Dry eye syndrome of bilateral lacrimal glands: Secondary | ICD-10-CM | POA: Diagnosis not present

## 2022-01-06 DIAGNOSIS — H524 Presbyopia: Secondary | ICD-10-CM | POA: Diagnosis not present

## 2022-01-13 ENCOUNTER — Ambulatory Visit: Payer: Medicare PPO

## 2022-01-20 ENCOUNTER — Ambulatory Visit (INDEPENDENT_AMBULATORY_CARE_PROVIDER_SITE_OTHER): Payer: Medicare PPO

## 2022-01-20 DIAGNOSIS — L501 Idiopathic urticaria: Secondary | ICD-10-CM | POA: Diagnosis not present

## 2022-02-12 ENCOUNTER — Ambulatory Visit: Payer: Medicare PPO | Admitting: Allergy & Immunology

## 2022-02-16 NOTE — Patient Instructions (Incomplete)
1. Angioedema - unknown trigger with normal lab workup - Continue with Xolair every four weeks. -Consider changing Xolair to every 2 weeks when it works better with your schedule -Increase Zyrtec (cetirizine) 10 mg 1 tablet twice a day.  If insurance does not cover this you will need to buy this over-the-counter. -Continue Singulair (montelukast) 10 mg once a day             2. Flexural atopic dermatitis -Continue triamcinolone 0.1% ointment twice a day as needed to red itchy areas.  Do not use triamcinolone on your face, neck, groin, or armpit region.  Do not use consistently more than 3 weeks in a row.   Schedule a follow up appointment in 4 to 6 weeks or sooner if needed

## 2022-02-17 ENCOUNTER — Encounter: Payer: Self-pay | Admitting: Family

## 2022-02-17 ENCOUNTER — Ambulatory Visit: Payer: Medicare PPO | Admitting: Family

## 2022-02-17 ENCOUNTER — Ambulatory Visit (INDEPENDENT_AMBULATORY_CARE_PROVIDER_SITE_OTHER): Payer: Medicare PPO

## 2022-02-17 VITALS — BP 136/78 | HR 55 | Temp 98.0°F | Resp 16 | Ht 63.8 in | Wt 241.6 lb

## 2022-02-17 DIAGNOSIS — T783XXD Angioneurotic edema, subsequent encounter: Secondary | ICD-10-CM

## 2022-02-17 DIAGNOSIS — L501 Idiopathic urticaria: Secondary | ICD-10-CM | POA: Diagnosis not present

## 2022-02-17 NOTE — Progress Notes (Signed)
[place], [address] [place] [address] Dept: [phone]  FOLLOW UP NOTE  Patient ID: Stefanie Taylor, female    DOB: October 09, 1955  Age: 66 y.o. MRN: [id number] Date of Office Visit: 02/17/2022  Assessment  Chief Complaint: Rash (Saturday started a breakout on wrist and back of hands, used triamcinolone cream .1%)  HPI Stefanie Taylor is a 66 year old female who presents today for follow-up of angioedema with urticaria and flexural atopic dermatitis.  She was last seen on February 11, 2021 by Dr. Ernst Bowler.  Since her last office visit she denies any new diagnosis or surgeries.  Idiopathic urticaria and angioedema is reported as not well controlled with Xolair injections every 4 weeks, Zyrtec 10 mg in the morning, and Singulair 10 mg once a day.  She reports that she breaks out so often it is hard to keep track of how frequently this is occurring.  She is not sure if Xolair helps, but mentions at one time before starting Xolair her eyes were swollen and beet red and she has not had any of that since starting Xolair.  She had a recent outbreak this Saturday and reports that she was not doing anything differently.  She has been using triamcinolone cream as needed and this helps.  The rash is not itchy all the time.  Discussed increasing her Xolair injections to every 2 weeks and she does not wish to do this at this time due to her brother being in hospice in [place] and she travels to see him every 2 weeks.  She is willing to try to increase her Zyrtec to twice a day and continue her Singulair in the morning.  Discussed how Zyrtec may make her sleepy and to caution with driving.  She verbalizes understanding.  She wonders if she needs a steroid.  Discussed the side effects with steroid use and from how her rash is just on her wrist and hands and is not itchy all the time I would like to hold off on starting a steroid for now.  Instructed her that if her hives get worse to contact our  office.  She thinks she has been on 1 or 2 rounds of steroids due to her hives since her last office visit.   Drug Allergies:  Allergies  Allergen Reactions   Elemental Sulfur    Lasix [Furosemide] Itching and Swelling   Sulfamethoxazole Itching   Ibuprofen Itching and Rash   Latex Rash   Penicillins Rash    Review of Systems: Review of Systems  Constitutional:  Negative for chills and fever.  HENT:         Reports a little bit of clear rhinorrhea and denies nasal congestion and postnasal drip  Eyes:        Reports occasional itchy watery eyes for which she has eyedrops for  Respiratory:  Negative for cough, shortness of breath and wheezing.   Cardiovascular:  Negative for chest pain and palpitations.  Gastrointestinal:        Denies heartburn or reflux symptoms  Genitourinary:  Negative for frequency.  Skin:  Positive for itching and rash.       Reports on Saturday that she broke out.  The rash does not itch all the time.  She has been using triamcinolone cream as needed and taking Zyrtec 10 mg and Singulair 10 mg once a day  Neurological:  Negative for headaches.    Physical Exam: BP 136/78 (BP Location: Left Arm, Patient Position: Sitting,  Cuff Size: Large)   Pulse (!) 55   Temp 98 F (36.7 C) (Temporal)   Resp 16   Ht 5' 3.8" (1.621 m)   Wt 241 lb 9.6 oz (109.6 kg)   SpO2 98%   BMI 41.73 kg/m    Physical Exam Constitutional:      Appearance: Normal appearance.  HENT:     Head: Normocephalic and atraumatic.     Comments: Pharynx normal, eyes normal, ears normal, nose normal    Right Ear: Tympanic membrane, ear canal and external ear normal.     Left Ear: Tympanic membrane, ear canal and external ear normal.     Nose: Nose normal.     Mouth/Throat:     Mouth: Mucous membranes are moist.     Pharynx: Oropharynx is clear.  Eyes:     Conjunctiva/sclera: Conjunctivae normal.  Cardiovascular:     Rate and Rhythm: Regular rhythm. Bradycardia present.     Heart  sounds: Normal heart sounds.  Pulmonary:     Effort: Pulmonary effort is normal.     Breath sounds: Normal breath sounds.     Comments: Lungs clear to auscultation Musculoskeletal:     Cervical back: Neck supple.  Skin:    General: Skin is warm.     Comments: Hyperpigmented papules noted on bilateral wrist.  No angioedema present  Neurological:     Mental Status: She is alert and oriented to person, place, and time.  Psychiatric:        Mood and Affect: Mood normal.        Behavior: Behavior normal.        Thought Content: Thought content normal.        Judgment: Judgment normal.    Diagnostics: None  Assessment and Plan: 1. Chronic idiopathic urticaria   2. Angioedema, subsequent encounter     No orders of the defined types were placed in this encounter.   Patient Instructions  1. Angioedema - unknown trigger with normal lab workup - Continue with Xolair every four weeks. -Consider changing Xolair to every 2 weeks when it works better with your schedule -Increase Zyrtec (cetirizine) 10 mg 1 tablet twice a day.  If insurance does not cover this you will need to buy this over-the-counter. -Continue Singulair (montelukast) 10 mg once a day             2. Flexural atopic dermatitis -Continue triamcinolone 0.1% ointment twice a day as needed to red itchy areas.  Do not use triamcinolone on your face, neck, groin, or armpit region.  Do not use consistently more than 3 weeks in a row.   Schedule a follow up appointment in 4 to 6 weeks or sooner if needed      Return in about 4 weeks (around 03/17/2022), or if symptoms worsen or fail to improve.    Thank you for the opportunity to care for this patient.  Please do not hesitate to contact me with questions.  Althea Charon, FNP [facility] [place] [facility] [place]

## 2022-03-17 ENCOUNTER — Encounter: Payer: Self-pay | Admitting: Family Medicine

## 2022-03-17 ENCOUNTER — Ambulatory Visit: Payer: Medicare PPO | Admitting: Family Medicine

## 2022-03-17 ENCOUNTER — Encounter: Payer: Self-pay | Admitting: Family

## 2022-03-17 ENCOUNTER — Other Ambulatory Visit: Payer: Self-pay | Admitting: *Deleted

## 2022-03-17 ENCOUNTER — Ambulatory Visit: Payer: Medicare PPO

## 2022-03-17 ENCOUNTER — Ambulatory Visit: Payer: Medicare PPO | Admitting: Family

## 2022-03-17 VITALS — BP 134/84 | HR 61 | Resp 16 | Ht 65.0 in | Wt 233.0 lb

## 2022-03-17 VITALS — BP 132/72 | HR 61 | Temp 98.0°F | Resp 20

## 2022-03-17 DIAGNOSIS — M5432 Sciatica, left side: Secondary | ICD-10-CM | POA: Diagnosis not present

## 2022-03-17 DIAGNOSIS — Z1231 Encounter for screening mammogram for malignant neoplasm of breast: Secondary | ICD-10-CM | POA: Diagnosis not present

## 2022-03-17 DIAGNOSIS — M159 Polyosteoarthritis, unspecified: Secondary | ICD-10-CM

## 2022-03-17 DIAGNOSIS — L501 Idiopathic urticaria: Secondary | ICD-10-CM | POA: Diagnosis not present

## 2022-03-17 DIAGNOSIS — Z1211 Encounter for screening for malignant neoplasm of colon: Secondary | ICD-10-CM

## 2022-03-17 DIAGNOSIS — Z13220 Encounter for screening for lipoid disorders: Secondary | ICD-10-CM | POA: Diagnosis not present

## 2022-03-17 DIAGNOSIS — L2089 Other atopic dermatitis: Secondary | ICD-10-CM

## 2022-03-17 DIAGNOSIS — T783XXD Angioneurotic edema, subsequent encounter: Secondary | ICD-10-CM | POA: Diagnosis not present

## 2022-03-17 DIAGNOSIS — Z6841 Body Mass Index (BMI) 40.0 and over, adult: Secondary | ICD-10-CM | POA: Diagnosis not present

## 2022-03-17 DIAGNOSIS — M5442 Lumbago with sciatica, left side: Secondary | ICD-10-CM | POA: Diagnosis not present

## 2022-03-17 DIAGNOSIS — G8929 Other chronic pain: Secondary | ICD-10-CM | POA: Diagnosis not present

## 2022-03-17 MED ORDER — MELOXICAM 15 MG PO TABS: ORAL_TABLET | ORAL | 0 refills | Status: DC

## 2022-03-17 MED ORDER — METHYLPREDNISOLONE ACETATE 80 MG/ML IJ SUSP
80.0000 mg | Freq: Once | INTRAMUSCULAR | Status: AC
  Administered 2022-03-17: 80 mg via INTRAMUSCULAR

## 2022-03-17 MED ORDER — PREDNISONE 10 MG PO TABS: 10.0000 mg | ORAL_TABLET | Freq: Two times a day (BID) | ORAL | 0 refills | Status: DC

## 2022-03-17 MED ORDER — OMALIZUMAB 150 MG/ML ~~LOC~~ SOSY: 300.0000 mg | PREFILLED_SYRINGE | SUBCUTANEOUS | 11 refills | Status: DC

## 2022-03-17 NOTE — Telephone Encounter (Signed)
Tried to call patient to advise will send in new Rx to increase dose but unable to reach patient

## 2022-03-17 NOTE — Patient Instructions (Addendum)
Annual exam in November, flu vaccine at visit, call if you need me sooner  Depo medrol  80 mg IM in office for left sciatic pain, and short course of oral prednisone is prescribed  Meloxicam is refilled for as needed use , sparingly  Covid booster is due, please get this  CONGRATS on weight loss, good exercise habits and good food choice, keep it up!  Chem 7 and EGFr, lipid and TSH today  Nurse please arrange cologuard  Please schedule [facility] appt [facility] , morning appointment preferred  Thanks for choosing [facility], we consider it a privelige to serve you.

## 2022-03-17 NOTE — Assessment & Plan Note (Addendum)
currennt flare , depo medrol 80 mg IM followed by short course of oral prednisone

## 2022-03-17 NOTE — Progress Notes (Signed)
[address], [address] [place] [address] Dept: [phone]  FOLLOW UP NOTE  Patient ID: Stefanie Taylor, female    DOB: 1956/07/19  Age: 66 y.o. MRN: [id number] Date of Office Visit: 03/17/2022  Assessment  Chief Complaint: Urticaria (Hives are still going on. Has some blisters/ dry skin on the fingers. ), Eczema (Has dryness. ), Angioedema (Wrist is swollen ), and Other (Feels some dryness in the left ear. )  HPI Stefanie Taylor is a 66 year old female who presents today for an acute visit.  She was last seen on February 17, 2022 by myself for chronic idiopathic urticaria, angioedema, and flexural atopic dermatitis.  Since her last office visit she denies any new diagnoses or surgeries.  She reports that since June 6 she has had a blistery rash that has been on her hands.  It was also on her neck, but she used triamcinolone and it helped.  The rash is described as burning, itchy, and painful.  She has tried using triamcinolone multiple times throughout the day, lotion, Benadryl, and Zyrtec.  When she uses the triamcinolone it relieves her symptoms temporarily.  The Benadryl helps her sleep.  She also saw her primary care physician today and was given a steroid injection for her sciatica and prednisone by mouth.  She continues to get her Xolair injections every 4 weeks.  She is interested in trying to increase her Xolair injections to every 2 weeks to see if this helps.  She denies any new medications, products, or foods since this last office visit.  No one else in the household has this rash.  She reports that the presentation of this rash is different from previous rashes she has had in the past.  The previous rashes she has had have not been blistered.  She reports that the blisters do not break or ooze.  Discussed that I was not sure what was the cause of this different rash, but that hopefully the steroid injection and prednisone given by her primary care physician would  help.  She  has seen dermatology in the past, but this was before COVID-19.  She was seen by dermatology for flexural atopic dermatitis and she uses triamcinolone as needed.  She also reports that her left ear feels different than her right ear.  She feels dryness in her left ear.  She reports that her husband did not notice anything differently when he looked.   Drug Allergies:  Allergies  Allergen Reactions   Elemental Sulfur    Lasix [Furosemide] Itching and Swelling   Sulfamethoxazole Itching   Ibuprofen Itching and Rash   Latex Rash   Penicillins Rash    Review of Systems: Review of Systems  Constitutional:  Negative for chills and fever.  HENT:         Denies rhinorrhea, nasal congestion, and postnasal drip  Eyes:        Denies itchy watery eyes  Respiratory:  Negative for cough, shortness of breath and wheezing.   Cardiovascular:  Negative for chest pain and palpitations.  Gastrointestinal:        Denies heartburn or reflux symptoms  Genitourinary:  Negative for frequency.  Skin:  Positive for itching and rash.       Reports rash since June 6 that is painful, burning, and itchy  Neurological:  Negative for headaches.     Physical Exam: BP 132/72   Pulse 61   Temp 98 F (36.7 C)   Resp 20  SpO2 98%    Physical Exam Constitutional:      Appearance: Normal appearance.  HENT:     Head: Normocephalic and atraumatic.     Comments: Pharynx normal, eyes normal, ears normal, nose normal    Right Ear: Tympanic membrane, ear canal and external ear normal.     Left Ear: Tympanic membrane, ear canal and external ear normal.     Nose: Nose normal.     Mouth/Throat:     Mouth: Mucous membranes are moist.     Pharynx: Oropharynx is clear.  Eyes:     Conjunctiva/sclera: Conjunctivae normal.  Cardiovascular:     Rate and Rhythm: Normal rate and regular rhythm.     Heart sounds: Normal heart sounds.  Pulmonary:     Effort: Pulmonary effort is normal.     Breath  sounds: Normal breath sounds.     Comments: Lungs clear to auscultation Musculoskeletal:     Cervical back: Neck supple.  Skin:    General: Skin is warm.     Comments: Papular lesions noted on bilateral wrist and dorsal aspect of hands.  Dry skin noted in the right palmar region.  No erythema, bleeding or oozing noted  Neurological:     Mental Status: She is alert and oriented to person, place, and time.  Psychiatric:        Mood and Affect: Mood normal.        Behavior: Behavior normal.        Thought Content: Thought content normal.        Judgment: Judgment normal.     Diagnostics:  None  Assessment and Plan: 1. Chronic idiopathic urticaria   2. Angioedema, subsequent encounter   3. Flexural atopic dermatitis     No orders of the defined types were placed in this encounter.   Patient Instructions  1. Angioedema/chronic idiopathic urticaria - unknown trigger with normal lab workup -I will send a message to Tammy, our biologics coordinator, about changing Xolair to every 2 weeks  - I will also put in a referral for dermatology due to different presentation of rash. She also received a steroid injection today and prednisone from her primary care physician -Continue Zyrtec (cetirizine) 10 mg 1 tablet twice a day.  I-Continue Singulair (montelukast) 10 mg once a day             2. Flexural atopic dermatitis -Decrease  triamcinolone 0.1% ointment twice a day as needed to red itchy areas.  Do not use triamcinolone on your face, neck, groin, or armpit region.  Do not use consistently more than 3 weeks in a row. - Continue daily moisturizing. May try a thicker cream such as Eucerin cream.    Schedule a follow up appointment in 4 to 6 weeks with Dr. Ernst Bowler or sooner if needed     Return in about 6 weeks (around 04/28/2022), or if symptoms worsen or fail to improve.    Thank you for the opportunity to care for this patient.  Please do not hesitate to contact me with  questions.  Althea Charon, FNP [facility]

## 2022-03-17 NOTE — Progress Notes (Signed)
   DEOLINDA FRID     MRN: [id number]      DOB: 12/12/1955   HPI [name] is here for follow up and re-evaluation of chronic medical conditions, medication management and review of any available recent lab and radiology data.  Preventive health is updated, specifically  Cancer screening and Immunization.   Current flare of inflammation of skin, will see allergist later today C/o increased and uncontrolled left buttock pain radiating to posterior left knee ,unprovoked, has h/o sciatic pain in the past , and has benefited from steroids  , short term [place] in the past 1 month off her bilke, minor injury reported and has reverted to her trike and also walks and does water aerobics   ROS Denies recent fever or chills. Denies sinus pressure, nasal congestion, ear pain or sore throat. Denies chest congestion, productive cough or wheezing. Denies chest pains, palpitations and leg swelling Denies abdominal pain, nausea, vomiting,diarrhea or constipation.   Denies dysuria, frequency, hesitancy or incontinence. Denies headaches, seizures, numbness, or tingling. Denies depression, anxiety or insomnia.    PE  BP 134/84   Pulse 61   Resp 16   Ht '5\' 5"'$  (1.651 m)   Wt 233 lb (105.7 kg)   SpO2 98%   BMI 38.77 kg/m   Patient alert and oriented and in no cardiopulmonary distress.  HEENT: No facial asymmetry, EOMI,      Chest: Clear to auscultation bilaterally.  CVS: S1, S2 no murmurs, no S3.Regular rate.  .   Ext: No edema  MS: Adequate, though mildly decreased  ROM  lumbar spine, normal in shoulders, hips and knees.  Skin: Intact, erythema and swelling noted on forearms Psych: Good eye contact, normal affect. Memory intact not anxious or depressed appearing.  CNS: CN 2-12 intact, power,  normal throughout.no focal deficits noted.   Assessment & Plan Low back pain with left-sided sciatica currennt flare , depo medrol 80 mg IM followed by short course of oral  prednisone  Morbid obesity, Weight - 324, BMI - 53.9. Improved , applauded on this and encouraged to continuesame  Patient re-educated about  the importance of commitment to a  minimum of 150 minutes of exercise per week as able.  The importance of healthy food choices with portion control discussed, as well as eating regularly and within a 12 hour window most days. The need to choose "clean , green" food 50 to 75% of the time is discussed, as well as to make water the primary drink and set a goal of 64 ounces water daily.       03/17/2022    8:11 AM 02/17/2022   11:36 AM 09/15/2021    8:12 AM  Weight /BMI  Weight 233 lb 241 lb 9.6 oz   Height '5\' 5"'$  (1.651 m) 5' 3.8" (1.621 m) 5' 4.96" (1.65 m)  BMI 38.77 kg/m2 41.73 kg/m2 40.32 kg/m2      Osteoarthritis Uses meloxicam as needed, for pain effectively  Chronic idiopathic urticaria Managed by Allergist, current flare has appt later today

## 2022-03-17 NOTE — Assessment & Plan Note (Addendum)
Improved , applauded on this and encouraged to continuesame  Patient re-educated about  the importance of commitment to a  minimum of 150 minutes of exercise per week as able.  The importance of healthy food choices with portion control discussed, as well as eating regularly and within a 12 hour window most days. The need to choose "clean , green" food 50 to 75% of the time is discussed, as well as to make water the primary drink and set a goal of 64 ounces water daily.       03/17/2022    8:11 AM 02/17/2022   11:36 AM 09/15/2021    8:12 AM  Weight /BMI  Weight 233 lb 241 lb 9.6 oz   Height '5\' 5"'$  (1.651 m) 5' 3.8" (1.621 m) 5' 4.96" (1.65 m)  BMI 38.77 kg/m2 41.73 kg/m2 40.32 kg/m2

## 2022-03-17 NOTE — Patient Instructions (Addendum)
1. Angioedema/chronic idiopathic urticaria - unknown trigger with normal lab workup -I will send a message to Tammy, our biologics coordinator, about changing Xolair to every 2 weeks  - I will also put in a referral for dermatology due to different presentation of rash. She also received a steroid injection today and prednisone from her primary care physician -Continue Zyrtec (cetirizine) 10 mg 1 tablet twice a day.  I-Continue Singulair (montelukast) 10 mg once a day             2. Flexural atopic dermatitis -Decrease  triamcinolone 0.1% ointment twice a day as needed to red itchy areas.  Do not use triamcinolone on your face, neck, groin, or armpit region.  Do not use consistently more than 3 weeks in a row. - Continue daily moisturizing. May try a thicker cream such as Eucerin cream.    Schedule a follow up appointment in 4 to 6 weeks with Dr. Ernst Bowler or sooner if needed

## 2022-03-18 LAB — BMP8+EGFR
BUN/Creatinine Ratio: 22 (ref 12–28)
BUN: 17 mg/dL (ref 8–27)
CO2: 25 mmol/L (ref 20–29)
Calcium: 9.5 mg/dL (ref 8.7–10.3)
Chloride: 100 mmol/L (ref 96–106)
Creatinine, Ser: 0.76 mg/dL (ref 0.57–1.00)
Glucose: 95 mg/dL (ref 70–99)
Potassium: 4.8 mmol/L (ref 3.5–5.2)
Sodium: 138 mmol/L (ref 134–144)
eGFR: 86 mL/min/{1.73_m2} (ref >59)

## 2022-03-18 LAB — LIPID PANEL
Chol/HDL Ratio: 1.9 ratio (ref 0.0–4.4)
Cholesterol, Total: 151 mg/dL (ref 100–199)
HDL: 79 mg/dL (ref >39)
LDL Chol Calc (NIH): 60 mg/dL (ref 0–99)
Triglycerides: 60 mg/dL (ref 0–149)
VLDL Cholesterol Cal: 12 mg/dL (ref 5–40)

## 2022-03-18 LAB — TSH: TSH: 1.54 u[IU]/mL (ref 0.450–4.500)

## 2022-03-19 ENCOUNTER — Ambulatory Visit: Payer: Medicare PPO | Admitting: Family Medicine

## 2022-03-19 ENCOUNTER — Ambulatory Visit (INDEPENDENT_AMBULATORY_CARE_PROVIDER_SITE_OTHER): Payer: Medicare PPO

## 2022-03-19 DIAGNOSIS — L501 Idiopathic urticaria: Secondary | ICD-10-CM

## 2022-03-21 ENCOUNTER — Encounter: Payer: Self-pay | Admitting: Family Medicine

## 2022-03-21 DIAGNOSIS — L501 Idiopathic urticaria: Secondary | ICD-10-CM | POA: Insufficient documentation

## 2022-03-21 NOTE — Assessment & Plan Note (Signed)
Uses meloxicam as needed, for pain effectively

## 2022-03-21 NOTE — Assessment & Plan Note (Signed)
Managed by Allergist, current flare has appt later today

## 2022-03-23 ENCOUNTER — Telehealth: Payer: Self-pay

## 2022-03-23 NOTE — Telephone Encounter (Signed)
-----   Message from Stefanie Taylor, [place] sent at 03/17/2022  9:57 AM EDT ----- Please refer to dermatology due to rash. Has seen Sydnee Levans with [facility] in past

## 2022-03-30 ENCOUNTER — Encounter: Payer: Self-pay | Admitting: Family Medicine

## 2022-03-30 DIAGNOSIS — Z6841 Body Mass Index (BMI) 40.0 and over, adult: Secondary | ICD-10-CM | POA: Diagnosis not present

## 2022-03-30 DIAGNOSIS — Z7952 Long term (current) use of systemic steroids: Secondary | ICD-10-CM | POA: Diagnosis not present

## 2022-03-30 DIAGNOSIS — L308 Other specified dermatitis: Secondary | ICD-10-CM | POA: Diagnosis not present

## 2022-03-30 DIAGNOSIS — Z79620 Long term (current) use of immunosuppressive biologic: Secondary | ICD-10-CM | POA: Diagnosis not present

## 2022-03-30 DIAGNOSIS — Z811 Family history of alcohol abuse and dependence: Secondary | ICD-10-CM | POA: Diagnosis not present

## 2022-03-30 DIAGNOSIS — L309 Dermatitis, unspecified: Secondary | ICD-10-CM | POA: Diagnosis not present

## 2022-03-30 DIAGNOSIS — Z809 Family history of malignant neoplasm, unspecified: Secondary | ICD-10-CM | POA: Diagnosis not present

## 2022-03-30 DIAGNOSIS — M792 Neuralgia and neuritis, unspecified: Secondary | ICD-10-CM | POA: Diagnosis not present

## 2022-03-30 DIAGNOSIS — R030 Elevated blood-pressure reading, without diagnosis of hypertension: Secondary | ICD-10-CM | POA: Diagnosis not present

## 2022-03-30 DIAGNOSIS — L988 Other specified disorders of the skin and subcutaneous tissue: Secondary | ICD-10-CM | POA: Diagnosis not present

## 2022-03-30 DIAGNOSIS — R21 Rash and other nonspecific skin eruption: Secondary | ICD-10-CM | POA: Diagnosis not present

## 2022-04-01 ENCOUNTER — Other Ambulatory Visit: Payer: Self-pay | Admitting: Family Medicine

## 2022-04-01 DIAGNOSIS — R21 Rash and other nonspecific skin eruption: Secondary | ICD-10-CM

## 2022-04-01 NOTE — Progress Notes (Signed)
Referral to [facility], derm clinic per specific pt request

## 2022-04-02 NOTE — Telephone Encounter (Signed)
Spoke with Stefanie Taylor on the phone and discussed that I was not certain that the rash that was going on on her hands/wrist was eczema due to how she was describing these areas as being blisters and burning. These areas did not respond to the steroids given by her primary care physician, steroid injection, and triamcinolone. She reports that dermatology did get a biopsy and she is waiting on results. She is now on a higher dose of steroids and a stronger strength of cream. These medications are helping but the areas are not gone.   Clarified that these areas on her hand and wrist are different from the hives that she has been having. She reports that they are different. She reports that she is not having hives right now, just the rash on her hands.She started out with the hives plus the rash on her hands. She is trying to modify her diet to see if it helps. She is avoiding gluten and not drinking wine.  She has not heard whether she has been approved for her Xolair to be increased to every 2 weeks. Discussed how I do not think that Xolair will help with the different rash on her hands,but that we were going to try to increase Xolair to every 2 weeks rather than 4 weeks due to still having hives. She verbalizes understanding.

## 2022-04-16 ENCOUNTER — Ambulatory Visit (INDEPENDENT_AMBULATORY_CARE_PROVIDER_SITE_OTHER): Payer: Medicare PPO

## 2022-04-16 DIAGNOSIS — L501 Idiopathic urticaria: Secondary | ICD-10-CM | POA: Diagnosis not present

## 2022-04-16 MED ORDER — OMALIZUMAB 150 MG/ML ~~LOC~~ SOSY
150.0000 mg | PREFILLED_SYRINGE | SUBCUTANEOUS | Status: DC
  Administered 2022-04-16: 150 mg via SUBCUTANEOUS

## 2022-04-28 ENCOUNTER — Ambulatory Visit: Payer: Medicare PPO

## 2022-04-28 ENCOUNTER — Ambulatory Visit: Payer: Medicare PPO | Admitting: Family

## 2022-05-26 ENCOUNTER — Ambulatory Visit (INDEPENDENT_AMBULATORY_CARE_PROVIDER_SITE_OTHER): Payer: Medicare PPO

## 2022-05-26 DIAGNOSIS — Z23 Encounter for immunization: Secondary | ICD-10-CM | POA: Diagnosis not present

## 2022-05-30 IMAGING — MG MM DIGITAL SCREENING BILAT W/ TOMO AND CAD
6 of 12 series · 6 of 36 positions shown · non-contrast
Comparison: Previous exam(s).

CLINICAL DATA: Screening.

EXAM:
DIGITAL SCREENING BILATERAL MAMMOGRAM WITH TOMOSYNTHESIS AND CAD
TECHNIQUE: Bilateral screening digital craniocaudal and mediolateral oblique
mammograms were obtained. Bilateral screening digital breast
tomosynthesis was performed. The images were evaluated with
computer-aided detection.

[L MLO synth-2D (1 of 2)]
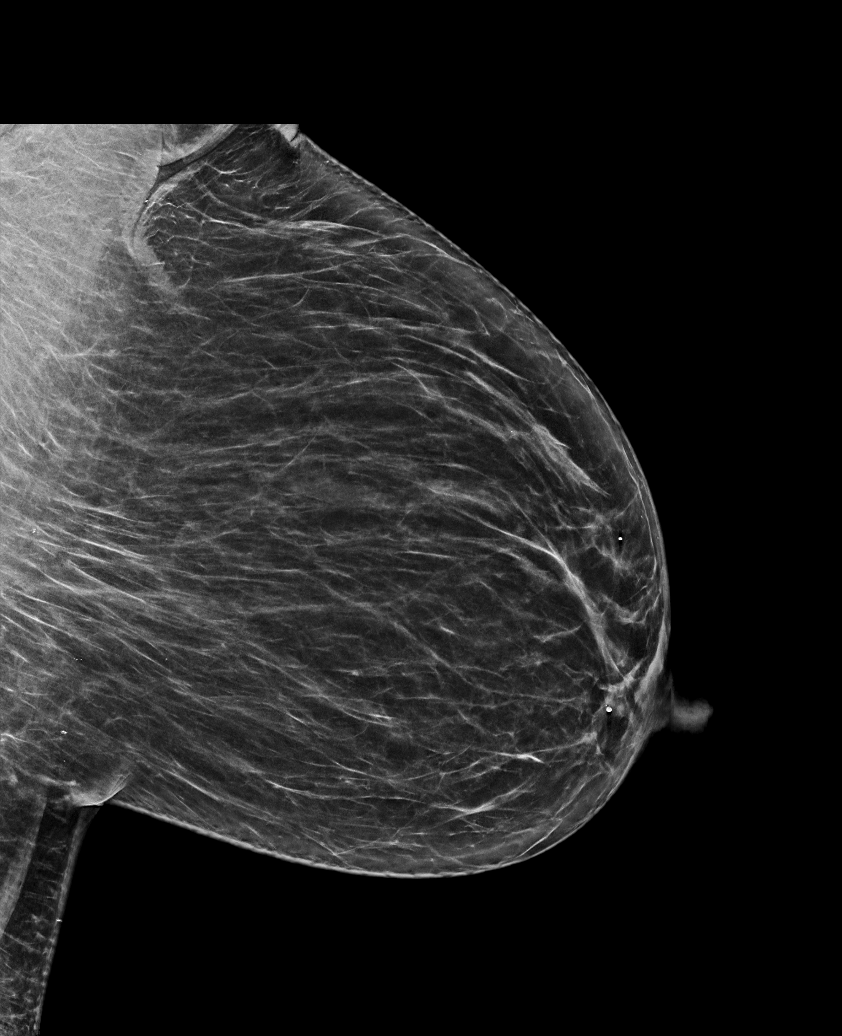

[R MLO synth-2D (1 of 2)]
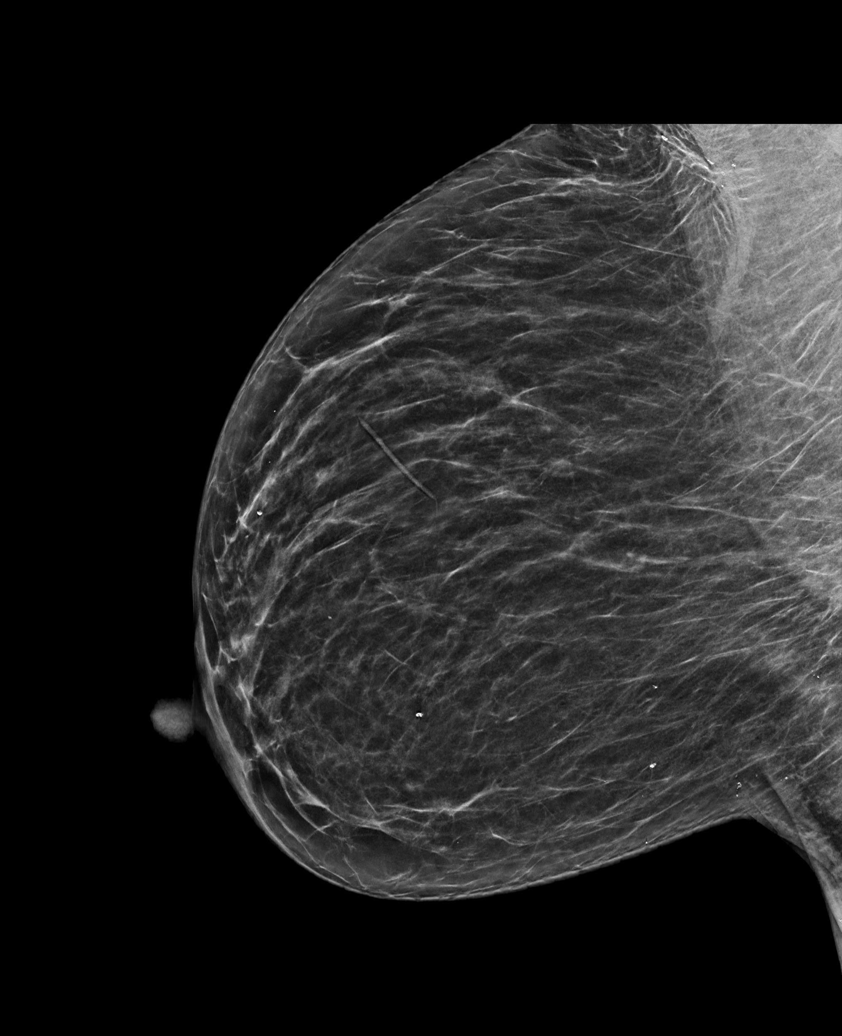

[R CC synth-2D]
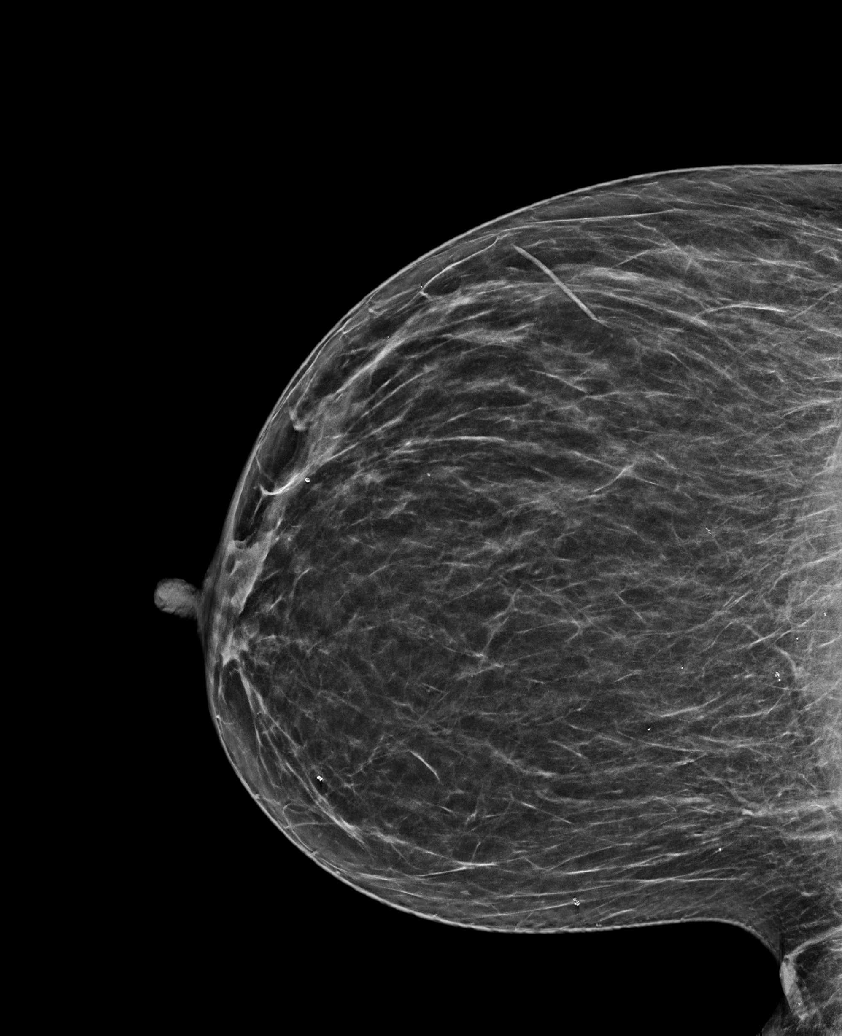

[L MLO synth-2D (2 of 2)]
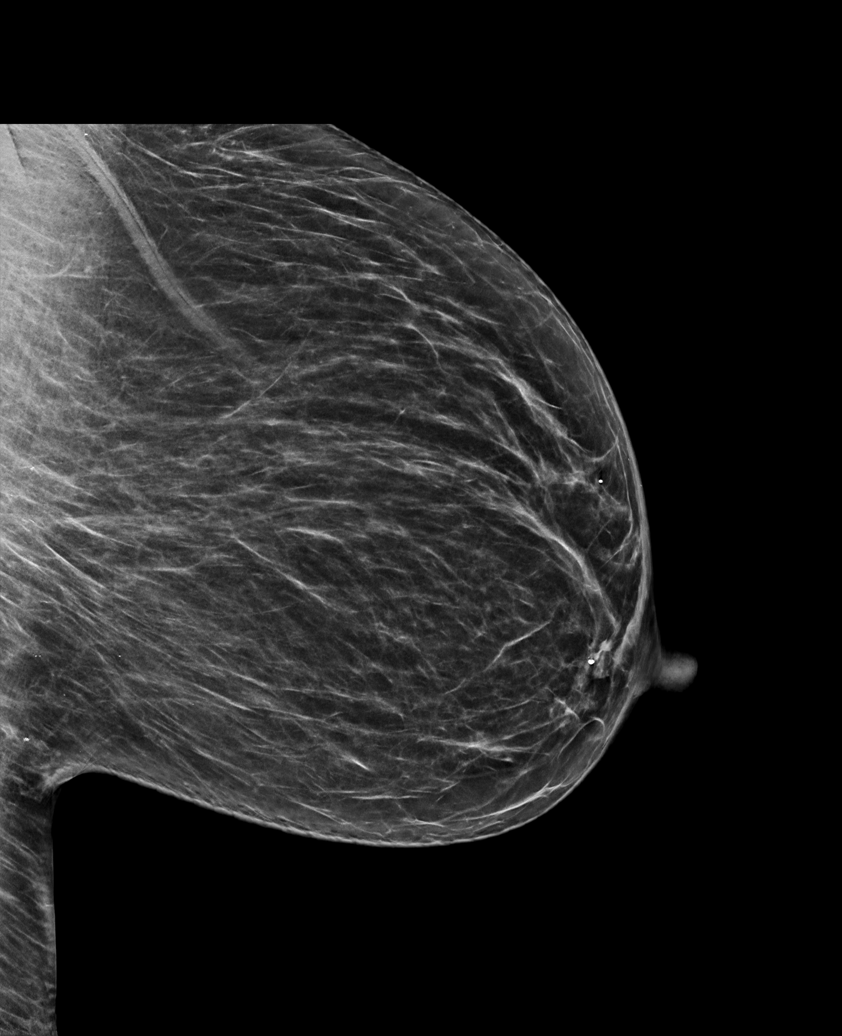

[R MLO synth-2D (2 of 2)]
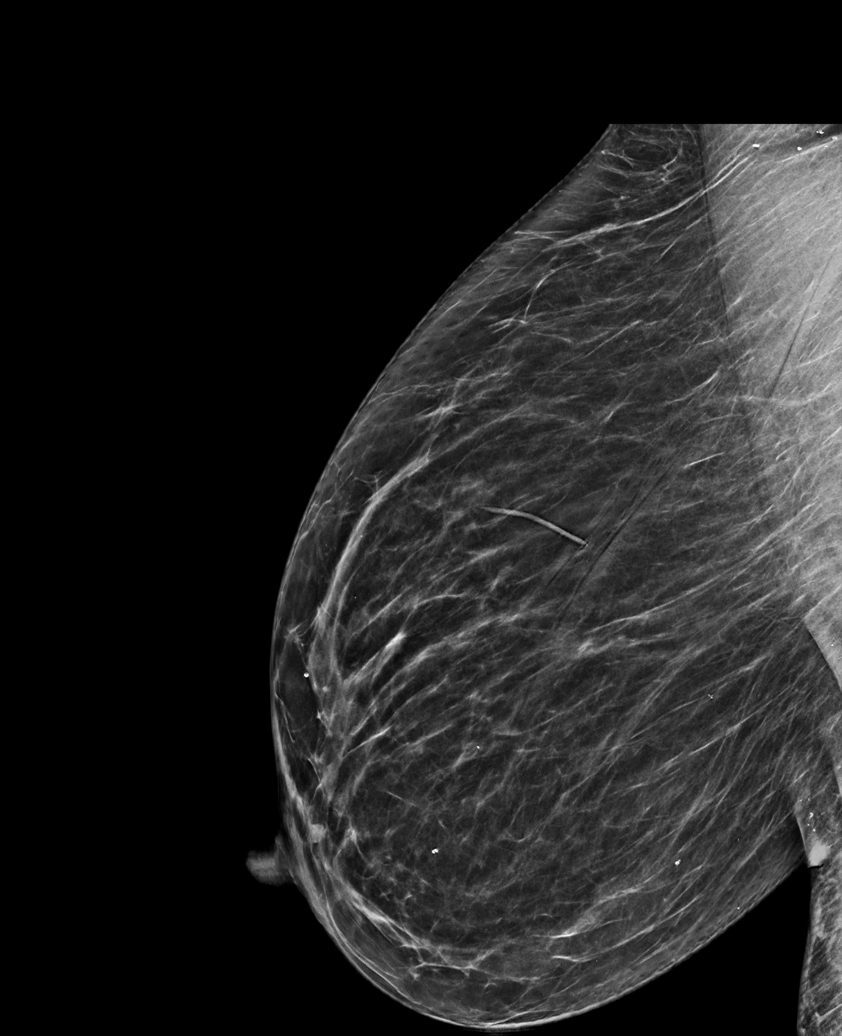

[L CC synth-2D]
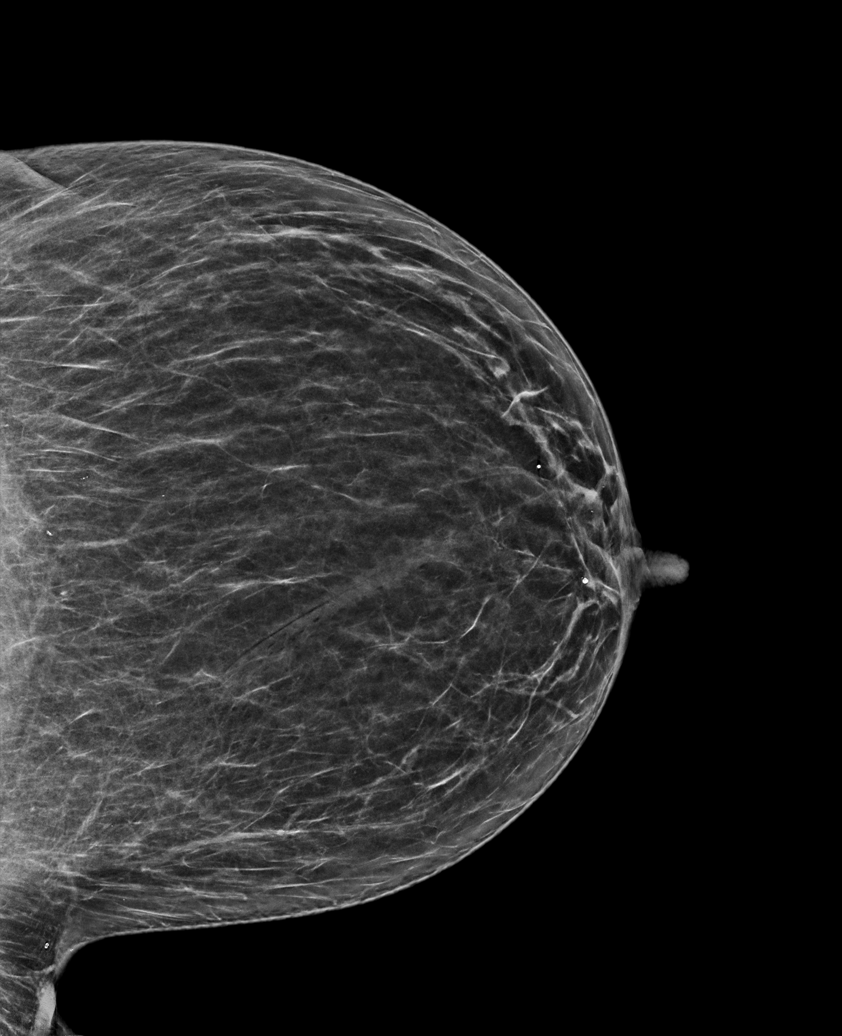

[6 of 36 positions shown; findings below may reference images not displayed]

ACR Breast Density Category b: There are scattered areas of
fibroglandular density.
FINDINGS: There are no findings suspicious for malignancy.
IMPRESSION: No mammographic evidence of malignancy. A result letter of this
screening mammogram will be mailed directly to the patient.

RECOMMENDATION:
Screening mammogram in one year. (Code:51-O-LD2)

BI-RADS CATEGORY  1: Negative.

## 2022-06-02 ENCOUNTER — Ambulatory Visit
Admission: RE | Admit: 2022-06-02 | Discharge: 2022-06-02 | Disposition: A | Discharge status: Home / Self Care | Payer: Medicare PPO | Source: Ambulatory Visit | Attending: Family Medicine | Admitting: Family Medicine

## 2022-06-02 DIAGNOSIS — Z1231 Encounter for screening mammogram for malignant neoplasm of breast: Secondary | ICD-10-CM | POA: Diagnosis not present

## 2022-06-16 ENCOUNTER — Emergency Department (HOSPITAL_BASED_OUTPATIENT_CLINIC_OR_DEPARTMENT_OTHER)
Admission: EM | Admit: 2022-06-16 | Discharge: 2022-06-16 | Disposition: A | Discharge status: Home / Self Care | Payer: Medicare PPO | Attending: Emergency Medicine | Admitting: Emergency Medicine

## 2022-06-16 ENCOUNTER — Emergency Department (HOSPITAL_BASED_OUTPATIENT_CLINIC_OR_DEPARTMENT_OTHER): Payer: Medicare PPO | Admitting: Radiology

## 2022-06-16 ENCOUNTER — Emergency Department (HOSPITAL_BASED_OUTPATIENT_CLINIC_OR_DEPARTMENT_OTHER): Payer: Medicare PPO

## 2022-06-16 ENCOUNTER — Encounter (HOSPITAL_BASED_OUTPATIENT_CLINIC_OR_DEPARTMENT_OTHER): Payer: Self-pay

## 2022-06-16 ENCOUNTER — Other Ambulatory Visit: Payer: Self-pay

## 2022-06-16 DIAGNOSIS — Z6839 Body mass index (BMI) 39.0-39.9, adult: Secondary | ICD-10-CM | POA: Diagnosis not present

## 2022-06-16 DIAGNOSIS — M25552 Pain in left hip: Secondary | ICD-10-CM | POA: Diagnosis not present

## 2022-06-16 DIAGNOSIS — M5442 Lumbago with sciatica, left side: Secondary | ICD-10-CM | POA: Diagnosis not present

## 2022-06-16 DIAGNOSIS — M5432 Sciatica, left side: Secondary | ICD-10-CM | POA: Diagnosis not present

## 2022-06-16 DIAGNOSIS — M4316 Spondylolisthesis, lumbar region: Secondary | ICD-10-CM | POA: Diagnosis not present

## 2022-06-16 DIAGNOSIS — Z01419 Encounter for gynecological examination (general) (routine) without abnormal findings: Secondary | ICD-10-CM | POA: Diagnosis not present

## 2022-06-16 DIAGNOSIS — M1612 Unilateral primary osteoarthritis, left hip: Secondary | ICD-10-CM | POA: Diagnosis not present

## 2022-06-16 DIAGNOSIS — Z124 Encounter for screening for malignant neoplasm of cervix: Secondary | ICD-10-CM | POA: Diagnosis not present

## 2022-06-16 MED ORDER — METHOCARBAMOL 500 MG PO TABS
500.0000 mg | ORAL_TABLET | Freq: Once | ORAL | Status: AC
  Administered 2022-06-16: 500 mg via ORAL
  Filled 2022-06-16: qty 1

## 2022-06-16 MED ORDER — METHYLPREDNISOLONE 4 MG PO TBPK: ORAL_TABLET | ORAL | 0 refills | Status: DC

## 2022-06-16 MED ORDER — KETOROLAC TROMETHAMINE 30 MG/ML IJ SOLN
15.0000 mg | Freq: Once | INTRAMUSCULAR | Status: AC
  Administered 2022-06-16: 15 mg via INTRAMUSCULAR
  Filled 2022-06-16: qty 1

## 2022-06-16 MED ORDER — METHOCARBAMOL 500 MG PO TABS: 500.0000 mg | ORAL_TABLET | Freq: Two times a day (BID) | ORAL | 0 refills | Status: DC

## 2022-06-16 NOTE — ED Notes (Signed)
Pt is able to ambulate with her cane without assistance from staff

## 2022-06-16 NOTE — ED Provider Notes (Signed)
[facility] EMERGENCY DEPT Provider Note   CSN: [id number] Arrival date & time: 06/16/22  1630     History {Add pertinent medical, surgical, social history, OB history to HPI:1} Chief Complaint  Patient presents with   Hip Pain    Stefanie Taylor is a 66 y.o. female.  Patient with intermittent left hip pain for the past several weeks but worse in the past 2 days.  Denies any fall or trauma.  States she has had sciatica many years ago and this feels similar.  Pain starts in her left buttock and radiates down her leg to her knee but does not go past the knee.  She notices increased pain when she tries to walk around the park and today had to end her walk early due to pain in her leg and states it was "dragging".  Denies any weakness in her leg currently.  No numbness or tingling.  No pain with urination or blood in the urine.  No bowel or bladder incontinence.  No fever.  No history of IV drug abuse or cancer.  She has required cortisone injections to her back in the past but denies any back pain currently.  No previous back surgeries.  No abdominal pain or chest pain.  No fever.  The history is provided by the patient.  Hip Pain Pertinent negatives include no abdominal pain and no headaches.       Home Medications Prior to Admission medications   Medication Sig Start Date End Date Taking? Authorizing Provider  cetirizine (ZYRTEC) 10 MG tablet Take 1 tablet (10 mg total) by mouth daily. 09/15/21   Fayrene Helper, MD  EPINEPHrine (AUVI-Q) 0.3 mg/0.3 mL IJ SOAJ injection Inject 0.3 mg into the muscle as needed for anaphylaxis. 02/11/21   Valentina Shaggy, MD  ergocalciferol (VITAMIN D2) 1.25 MG (50000 UT) capsule Take 1 capsule (50,000 Units total) by mouth once a week. One capsule once weekly 09/17/21   Fayrene Helper, MD  hydrocortisone 2.5 % ointment Apply topically 2 (two) times daily. 08/20/21   Valentina Shaggy, MD  meloxicam [facility]) 15 MG tablet  Take one tablet once daily, as needed, for pain 03/17/22   Fayrene Helper, MD  montelukast (SINGULAIR) 10 MG tablet Take 1 tablet (10 mg total) by mouth at bedtime. 09/15/21   Fayrene Helper, MD  Multiple Vitamin (MULTIVITAMIN ADULT PO)     [provider]  omalizumab Arvid Right) 150 MG/ML prefilled syringe Inject 300 mg into the skin every 14 (fourteen) days. 03/17/22   Althea Charon, FNP  predniSONE (DELTASONE) 10 MG tablet Take 1 tablet (10 mg total) by mouth 2 (two) times daily with a meal. Patient not taking: Reported on 03/17/2022 03/17/22   Fayrene Helper, MD  RESTASIS 0.05 % ophthalmic emulsion 1 drop 2 (two) times daily. 09/23/20   [provider]  triamcinolone 0.1% oint-Eucerin equivalent cream 1:1 mixture Use 1 application twice a day as needed to red itchy areas. Do not use on face, neck, groin, or armpit region 10/24/20   Althea Charon, FNP  triamcinolone ointment (KENALOG) 0.1 % Apply 1 application topically 2 (two) times daily as needed. 02/11/21   Valentina Shaggy, MD      Allergies    Elemental sulfur, Lasix [furosemide], Sulfamethoxazole, Ibuprofen, Latex, and Penicillins    Review of Systems   Review of Systems  Constitutional:  Negative for activity change, appetite change and fever.  Gastrointestinal:  Negative for abdominal pain, nausea  and vomiting.  Musculoskeletal:  Positive for back pain and myalgias.  Skin:  Negative for rash.  Neurological:  Negative for weakness and headaches.    all other systems are negative except as noted in the HPI and PMH.   Physical Exam Updated Vital Signs BP 100/68 (BP Location: Left Arm)   Pulse 70   Temp 98.2 F (36.8 C) (Oral)   Resp 18   Ht '5\' 5"'$  (1.651 m)   Wt 105.7 kg   SpO2 100%   BMI 38.77 kg/m  Physical Exam Vitals and nursing note reviewed.  Constitutional:      General: She is not in acute distress.    Appearance: She is well-developed.  HENT:     Head: Normocephalic and atraumatic.      Mouth/Throat:     Pharynx: No oropharyngeal exudate.  Eyes:     Conjunctiva/sclera: Conjunctivae normal.     Pupils: Pupils are equal, round, and reactive to light.  Neck:     Comments: No meningismus. Cardiovascular:     Rate and Rhythm: Normal rate and regular rhythm.     Heart sounds: Normal heart sounds. No murmur heard. Pulmonary:     Effort: Pulmonary effort is normal. No respiratory distress.     Breath sounds: Normal breath sounds.  Abdominal:     Palpations: Abdomen is soft.     Tenderness: There is no abdominal tenderness. There is no guarding or rebound.  Musculoskeletal:        General: Tenderness present. Normal range of motion.     Cervical back: Normal range of motion and neck supple.     Comments: Chaperone present Conservation officer, historic buildings.  There is tenderness to her left buttock near piriformis insertion site.  5/5 strength in bilateral lower extremities. Ankle plantar and dorsiflexion intact. Great toe extension intact bilaterally. +2 DP and PT pulses.  Unable to elicit patellar reflexes bilaterally.  Antalgic gait with a walking stick.  Skin:    General: Skin is warm.  Neurological:     Mental Status: She is alert and oriented to person, place, and time.     Cranial Nerves: No cranial nerve deficit.     Motor: No abnormal muscle tone.     Coordination: Coordination normal.     Comments:  5/5 strength throughout. CN 2-12 intact.Equal grip strength.   Psychiatric:        Behavior: Behavior normal.     ED Results / Procedures / Treatments   Labs (all labs ordered are listed, but only abnormal results are displayed) Labs Reviewed - No data to display  EKG None  Radiology DG Hip Unilat With Pelvis 2-3 Views Left  Result Date: 06/16/2022 CLINICAL DATA:  Hip pain EXAM: DG HIP (WITH OR WITHOUT PELVIS) 3V LEFT COMPARISON:  None Available. FINDINGS: There is no evidence of hip fracture or dislocation. 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 Degenerative changes  of the partially visualized lower lumbar spine. IMPRESSION: 1. No acute osseous abnormality. 2. 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 Electronically Signed   By: Yetta Glassman M.D.   On: 06/16/2022 17:14    Procedures Procedures  {Document cardiac monitor, telemetry assessment procedure when appropriate:1}  Medications Ordered in ED Medications  ketorolac (TORADOL) 30 MG/ML injection 15 mg (has no administration in time range)  methocarbamol (ROBAXIN) tablet 500 mg (has no administration in time range)    ED Course/ Medical Decision Making/ A&P  Medical Decision Making Amount and/or Complexity of Data Reviewed Labs: ordered. Decision-making details documented in ED Course. Radiology: ordered and independent interpretation performed. Decision-making details documented in ED Course. ECG/medicine tests: ordered and independent interpretation performed. Decision-making details documented in ED Course.  Risk Prescription drug management.  Acute on chronic left hip pain that radiates down left leg concerning for sciatica.  Intact distal strength, sensation, pulses.  Unable to elicit reflexes but low suspicion for cord compression or cauda equina.  {Document critical care time when appropriate:1} {Document review of labs and clinical decision tools ie heart score, Chads2Vasc2 etc:1}  {Document your independent review of radiology images, and any outside records:1} {Document your discussion with family members, caretakers, and with consultants:1} {Document social determinants of health affecting pt's care:1} {Document your decision making why or why not admission, treatments were needed:1} Final Clinical Impression(s) / ED Diagnoses Final diagnoses:  None    Rx / DC Orders ED Discharge Orders     None

## 2022-06-16 NOTE — Discharge Instructions (Signed)
Take the steroids and muscle relaxers as prescribed.  Follow-up with your primary doctor for MRI of your back.  Return to the ED sooner with worsening pain, weakness, numbness, tingling, bowel or bladder incontinence or any other concerns.

## 2022-06-16 NOTE — ED Triage Notes (Signed)
Pt c/o left hip pain that radiates, since January but worse the last few days. Pt states she has hx of sciatica, but never had it "debilitating" where she needs a walking stick to get around.   No injury/trauma.

## 2022-07-06 ENCOUNTER — Other Ambulatory Visit: Payer: Self-pay

## 2022-07-06 ENCOUNTER — Telehealth: Payer: Self-pay | Admitting: Family Medicine

## 2022-07-06 DIAGNOSIS — G8929 Other chronic pain: Secondary | ICD-10-CM

## 2022-07-06 NOTE — Telephone Encounter (Signed)
Patient called in regard to MRI orders.  Patient states that Dr. Wyvonnia Dusky recommended patient have MRI  Patient wants a cll back in regard.

## 2022-07-06 NOTE — Telephone Encounter (Signed)
[place].

## 2022-07-15 ENCOUNTER — Encounter: Payer: Self-pay | Admitting: Family Medicine

## 2022-07-15 ENCOUNTER — Ambulatory Visit (INDEPENDENT_AMBULATORY_CARE_PROVIDER_SITE_OTHER): Payer: Medicare PPO | Admitting: Family Medicine

## 2022-07-15 VITALS — BP 134/82 | HR 62 | Ht 65.0 in | Wt 225.1 lb

## 2022-07-15 DIAGNOSIS — Z0000 Encounter for general adult medical examination without abnormal findings: Secondary | ICD-10-CM | POA: Insufficient documentation

## 2022-07-15 DIAGNOSIS — Z13220 Encounter for screening for lipoid disorders: Secondary | ICD-10-CM

## 2022-07-15 DIAGNOSIS — E559 Vitamin D deficiency, unspecified: Secondary | ICD-10-CM

## 2022-07-15 NOTE — Progress Notes (Signed)
    Stefanie Taylor     MRN: [id number]      DOB: May 18, 1956  HPI: Patient is in for annual physical exam. No other health concerns are expressed or addressed at the visit. Recent labs,  are reviewed. Immunization is reviewed , and  updated if needed.   PE: BP 134/82   Pulse 62   Ht '5\' 5"'$  (1.651 m)   Wt 225 lb 1.9 oz (102.1 kg)   SpO2 98%   BMI 37.46 kg/m   Pleasant  female, alert and oriented x 3, in no cardio-pulmonary distress. Afebrile. HEENT No facial trauma or asymetry. Sinuses non tender.  Extra occullar muscles intact.. External ears normal, . Neck: supple, no adenopathy,JVD or thyromegaly.No bruits.  Chest: Clear to ascultation bilaterally.No crackles or wheezes. Non tender to palpation   Cardiovascular system; Heart sounds normal,  S1 and  S2 ,no S3.  No murmur, or thrill. Apical beat not displaced Peripheral pulses normal.  Abdomen: Soft, non tender      Musculoskeletal exam:  Decreased though adequate ROM spine, shoulders, hi[ps and kneees deformity ,swelling and  crepitus noted.in knees No muscle wasting or atrophy.   Neurologic: Cranial nerves 2 to 12 intact. Power, tone ,sensation  normal throughout.  disturbance in gait. No tremor.  Skin: Intact, no ulceration, erythema , scaling or rash noted.  Psych; Normal mood and affect. Judgement and concentration normal, currently grieving   Assessment & Plan:  Annual physical exam Annual exam as documented. Counseling done  re healthy lifestyle involving commitment to 150 minutes exercise per week, heart healthy diet, and attaining healthy weight.The importance of adequate sleep also discussed. Regular seat belt use and home safety, is also discussed. Changes in health habits are decided on by the patient with goals and time frames  set for achieving them. Immunization and cancer screening needs are specifically addressed at this visit.

## 2022-07-15 NOTE — Assessment & Plan Note (Signed)

## 2022-07-15 NOTE — Patient Instructions (Addendum)
Follow-up in 6 months, re evaluate blood pressure, call if you need me sooner.  Please do not get the RSV vaccine this is indicated for everyone 60 and over.  Fasting Chem-7 and EGFR CBC TSH and vitamin D second week in January.  Best with physical therapy for left knee.  I will be in touch with you regarding MRI results.  Reduce salt intake and increase vegetables and fruit to help to lower blood pressure, and continue to work on weight loss.  Since your convalescence and peripheral support regarding your recent loss.  Thanks for choosing [facility], we consider it a privelige to serve you.   Blood pressure is above normal range, of less than 120/80 so I am providing you with information to help to lower blood pressure.  No medications needed at this time  Hypertension, Adult High blood pressure (hypertension) is when the force of blood pumping through the arteries is too strong. The arteries are the blood vessels that carry blood from the heart throughout the body. Hypertension forces the heart to work harder to pump blood and may cause arteries to become narrow or stiff. Untreated or uncontrolled hypertension can lead to a heart attack, heart failure, a stroke, kidney disease, and other problems. A blood pressure reading consists of a higher number over a lower number. Ideally, your blood pressure should be below 120/80. The first ("top") number is called the systolic pressure. It is a measure of the pressure in your arteries as your heart beats. The second ("bottom") number is called the diastolic pressure. It is a measure of the pressure in your arteries as the heart relaxes. What are the causes? The exact cause of this condition is not known. There are some conditions that result in high blood pressure. What increases the risk? Certain factors may make you more likely to develop high blood pressure. Some of these risk factors are under your control,  including: Smoking. Not getting enough exercise or physical activity. Being overweight. Having too much fat, sugar, calories, or salt (sodium) in your diet. Drinking too much alcohol. Other risk factors include: Having a personal history of heart disease, diabetes, high cholesterol, or kidney disease. Stress. Having a family history of high blood pressure and high cholesterol. Having obstructive sleep apnea. Age. The risk increases with age. What are the signs or symptoms? High blood pressure may not cause symptoms. Very high blood pressure (hypertensive crisis) may cause: Headache. Fast or irregular heartbeats (palpitations). Shortness of breath. Nosebleed. Nausea and vomiting. Vision changes. Severe chest pain, dizziness, and seizures. How is this diagnosed? This condition is diagnosed by measuring your blood pressure while you are seated, with your arm resting on a flat surface, your legs uncrossed, and your feet flat on the floor. The cuff of the blood pressure monitor will be placed directly against the skin of your upper arm at the level of your heart. Blood pressure should be measured at least twice using the same arm. Certain conditions can cause a difference in blood pressure between your right and left arms. If you have a high blood pressure reading during one visit or you have normal blood pressure with other risk factors, you may be asked to: Return on a different day to have your blood pressure checked again. Monitor your blood pressure at home for 1 week or longer. If you are diagnosed with hypertension, you may have other blood or imaging tests to help your health care provider understand your overall risk for  other conditions. How is this treated? This condition is treated by making healthy lifestyle changes, such as eating healthy foods, exercising more, and reducing your alcohol intake. You may be referred for counseling on a healthy diet and physical activity. Your  health care provider may prescribe medicine if lifestyle changes are not enough to get your blood pressure under control and if: Your systolic blood pressure is above 130. Your diastolic blood pressure is above 80. Your personal target blood pressure may vary depending on your medical conditions, your age, and other factors. Follow these instructions at home: Eating and drinking  Eat a diet that is high in fiber and potassium, and low in sodium, added sugar, and fat. An example of this eating plan is called the DASH diet. DASH stands for Dietary Approaches to Stop Hypertension. To eat this way: Eat plenty of fresh fruits and vegetables. Try to fill one half of your plate at each meal with fruits and vegetables. Eat whole grains, such as whole-wheat pasta, brown rice, or whole-grain bread. Fill about one fourth of your plate with whole grains. Eat or drink low-fat dairy products, such as skim milk or low-fat yogurt. Avoid fatty cuts of meat, processed or cured meats, and poultry with skin. Fill about one fourth of your plate with lean proteins, such as fish, chicken without skin, beans, eggs, or tofu. Avoid pre-made and processed foods. These tend to be higher in sodium, added sugar, and fat. Reduce your daily sodium intake. Many people with hypertension should eat less than 1,500 mg of sodium a day. Do not drink alcohol if: Your health care provider tells you not to drink. You are pregnant, may be pregnant, or are planning to become pregnant. If you drink alcohol: Limit how much you have to: 0-1 drink a day for women. 0-2 drinks a day for men. Know how much alcohol is in your drink. In the [place] one drink equals one 12 oz bottle of beer (355 mL), one 5 oz glass of wine (148 mL), or one 1 oz glass of hard liquor (44 mL). Lifestyle  Work with your health care provider to maintain a healthy body weight or to lose weight. Ask what an ideal weight is for you. Get at least 30 minutes of exercise  that causes your heart to beat faster (aerobic exercise) most days of the week. Activities may include walking, swimming, or biking. Include exercise to strengthen your muscles (resistance exercise), such as Pilates or lifting weights, as part of your weekly exercise routine. Try to do these types of exercises for 30 minutes at least 3 days a week. Do not use any products that contain nicotine or tobacco. These products include cigarettes, chewing tobacco, and vaping devices, such as e-cigarettes. If you need help quitting, ask your health care provider. Monitor your blood pressure at home as told by your health care provider. Keep all follow-up visits. This is important. Medicines Take over-the-counter and prescription medicines only as told by your health care provider. Follow directions carefully. Blood pressure medicines must be taken as prescribed. Do not skip doses of blood pressure medicine. Doing this puts you at risk for problems and can make the medicine less effective. Ask your health care provider about side effects or reactions to medicines that you should watch for. Contact a health care provider if you: Think you are having a reaction to a medicine you are taking. Have headaches that keep coming back (recurring). Feel dizzy. Have swelling in your ankles. Have  trouble with your vision. Get help right away if you: Develop a severe headache or confusion. Have unusual weakness or numbness. Feel faint. Have severe pain in your chest or abdomen. Vomit repeatedly. Have trouble breathing. These symptoms may be an emergency. Get help right away. Call 911. Do not wait to see if the symptoms will go away. Do not drive yourself to the hospital. Summary Hypertension is when the force of blood pumping through your arteries is too strong. If this condition is not controlled, it may put you at risk for serious complications. Your personal target blood pressure may vary depending on your  medical conditions, your age, and other factors. For most people, a normal blood pressure is less than 120/80. Hypertension is treated with lifestyle changes, medicines, or a combination of both. Lifestyle changes include losing weight, eating a healthy, low-sodium diet, exercising more, and limiting alcohol. This information is not intended to replace advice given to you by your health care provider. Make sure you discuss any questions you have with your health care provider. Document Revised: 07/07/2021 Document Reviewed: 07/07/2021 Elsevier Patient Education  [place].

## 2022-07-26 ENCOUNTER — Ambulatory Visit (HOSPITAL_COMMUNITY)
Admission: RE | Admit: 2022-07-26 | Discharge: 2022-07-26 | Disposition: A | Discharge status: Home / Self Care | Payer: Medicare PPO | Source: Ambulatory Visit | Attending: Family Medicine | Admitting: Family Medicine

## 2022-07-26 DIAGNOSIS — M545 Low back pain, unspecified: Secondary | ICD-10-CM | POA: Diagnosis not present

## 2022-07-26 DIAGNOSIS — M5442 Lumbago with sciatica, left side: Secondary | ICD-10-CM | POA: Insufficient documentation

## 2022-07-26 DIAGNOSIS — M5136 Other intervertebral disc degeneration, lumbar region: Secondary | ICD-10-CM | POA: Diagnosis not present

## 2022-07-26 DIAGNOSIS — G8929 Other chronic pain: Secondary | ICD-10-CM | POA: Diagnosis not present

## 2022-07-27 ENCOUNTER — Ambulatory Visit (HOSPITAL_COMMUNITY): Admission: RE | Admit: 2022-07-27 | Payer: Medicare PPO | Source: Ambulatory Visit

## 2022-07-28 ENCOUNTER — Telehealth: Payer: Self-pay | Admitting: Family Medicine

## 2022-07-28 NOTE — Telephone Encounter (Signed)
I just reviewed the mRI lumbar report with the pt who wants the soonest available appt with Neurosurg for her back and leg pain with possible weakness in the leg Pls call me so I can  determine which Neuroosurgeon I will refer her to before I put in the referral I know you do not do those appts , will then take next step to [facility] for appt  scheduling

## 2022-07-29 ENCOUNTER — Other Ambulatory Visit: Payer: Self-pay | Admitting: Family Medicine

## 2022-07-29 DIAGNOSIS — G8929 Other chronic pain: Secondary | ICD-10-CM

## 2022-08-03 NOTE — Telephone Encounter (Signed)
Referred pt to [facility] neurosurgery

## 2022-08-05 ENCOUNTER — Other Ambulatory Visit: Payer: Self-pay | Admitting: Family Medicine

## 2022-09-20 ENCOUNTER — Ambulatory Visit (INDEPENDENT_AMBULATORY_CARE_PROVIDER_SITE_OTHER): Payer: Medicare PPO | Admitting: Internal Medicine

## 2022-09-20 ENCOUNTER — Encounter: Payer: Self-pay | Admitting: Internal Medicine

## 2022-09-20 VITALS — BP 124/64 | HR 40 | Resp 16 | Ht 65.0 in | Wt 223.0 lb

## 2022-09-20 DIAGNOSIS — Z0000 Encounter for general adult medical examination without abnormal findings: Secondary | ICD-10-CM

## 2022-09-20 DIAGNOSIS — R001 Bradycardia, unspecified: Secondary | ICD-10-CM

## 2022-09-20 NOTE — Assessment & Plan Note (Addendum)
Here for AWV. Patient bradycardic on exam. Will obtain EKG. She is asymptomatic.   Addendum: Sinus bradycardia with PAC's . Review of previous EKG showed sinus bradycardia. Will have patient come by for BMP and Mg. No obvious medications or other causes.

## 2022-09-20 NOTE — Patient Instructions (Signed)
  Ms. Lamson , Thank you for taking time to come for your Medicare Wellness Visit. I appreciate your ongoing commitment to your health goals. Please review the following plan we discussed and let me know if I can assist you in the future.   These are the goals we discussed: She has appointment with neurosurgeon. Once her pain has improved she would like to walk 5 days a week in order to lose approximately 25 lbs.   This is a list of the screening recommended for you and due dates:  Health Maintenance  Topic Date Due   COVID-19 Vaccine (6 - 2023-24 season) 09/03/2022   Medicare Annual Wellness Visit  09/17/2022   Cologuard (Stool DNA test)  09/24/2023   DTaP/Tdap/Td vaccine (2 - Td or Tdap) 02/13/2024   Mammogram  06/02/2024   Pneumonia Vaccine  Completed   Flu Shot  Completed   DEXA scan (bone density measurement)  Completed   Hepatitis C Screening: USPSTF Recommendation to screen - Ages 20-79 yo.  Completed   Zoster (Shingles) Vaccine  Completed   HPV Vaccine  Aged Out   Colon Cancer Screening  Discontinued

## 2022-09-20 NOTE — Progress Notes (Signed)
Subjective:   Stefanie Taylor is a 67 y.o. female who presents for Medicare Annual (Subsequent) preventive examination.  Review of Systems  All other systems reviewed and are negative.    Objective:    Today's Vitals   09/20/22 1405 09/20/22 1408  BP: (!) 122/58 124/64  Pulse: (!) 40   Resp: 16   SpO2: 96%   Weight: 223 lb (101.2 kg)   Height: '5\' 5"'$  (1.651 m)   PainSc:  3    Body mass index is 37.11 kg/m.     09/20/2022    2:10 PM 06/16/2022    4:44 PM 09/17/2021    9:28 AM 05/28/2016    7:39 AM 12/25/2012    8:08 AM  Advanced Directives  Does Patient Have a Medical Advance Directive? Yes No Yes Yes Patient has advance directive, copy not in chart  Type of Advance Directive   Living will;Healthcare Power of Stefanie Taylor;Living will Stefanie Taylor;Living will  Does patient want to make changes to medical advance directive? No - Patient declined      Copy of Stefanie Taylor in Chart?    No - copy requested     Current Medications (verified) Outpatient Encounter Medications as of 09/20/2022  Medication Sig   cetirizine (ZYRTEC) 10 MG tablet TAKE 1 TABLET(10 MG) BY MOUTH DAILY   EPINEPHrine (AUVI-Q) 0.3 mg/0.3 mL IJ SOAJ injection Inject 0.3 mg into the muscle as needed for anaphylaxis.   hydrocortisone 2.5 % ointment Apply topically 2 (two) times daily.   montelukast (SINGULAIR) 10 MG tablet Take 1 tablet (10 mg total) by mouth at bedtime.   Multiple Vitamin (MULTIVITAMIN ADULT PO)    triamcinolone 0.1% oint-Eucerin equivalent cream 1:1 mixture Use 1 application twice a day as needed to red itchy areas. Do not use on face, neck, groin, or armpit region   triamcinolone ointment (KENALOG) 0.1 % Apply 1 application topically 2 (two) times daily as needed.   Facility-Administered Encounter Medications as of 09/20/2022  Medication   omalizumab Arvid Right) prefilled syringe 150 mg    Allergies (verified) Elemental sulfur,  Lasix [furosemide], Sulfamethoxazole, Ibuprofen, Latex, and Penicillins   History: Past Medical History:  Diagnosis Date   H/O sickle cell trait    Hypertension    Morbid obesity with BMI of 50.0-59.9, adult ([place]) 08/15/12   BMI: 53.1 kg/m^2   Past Surgical History:  Procedure Laterality Date   BIOPSY BREAST     BREAST BIOPSY     BREAST EXCISIONAL BIOPSY Right    BREAST SURGERY  1998   biopsy mole on right breast   BREATH TEK H PYLORI  09/04/2012   Procedure: BREATH TEK H PYLORI;  Surgeon: Shann Medal, MD;  Location: [facility];  Service: General;  Laterality: N/A;   BREATH TEK H PYLORI N/A 11/06/2012   Procedure: Lauris Chroman;  Surgeon: Shann Medal, MD;  Location: [facility];  Service: General;  Laterality: N/A;   COLONOSCOPY WITH PROPOFOL N/A 05/28/2016   Procedure: COLONOSCOPY WITH PROPOFOL;  Surgeon: Carol Ada, MD;  Location: [facility];  Service: Endoscopy;  Laterality: N/A;   GASTRIC BYPASS  09/07/2017   TUBAL LIGATION     UMBILICAL HERNIA REPAIR     age 46   Family History  Problem Relation Age of Onset   Hypertension Mother    Heart disease Mother    Cancer Mother    Hypertension Father    Hypertension Sister  Alcohol abuse Sister    Diabetes Brother    Hypertension Brother    Diabetes Sister    Hypertension Sister    Diabetes Sister    Hypertension Sister    Diabetes Brother    Hypertension Brother    Diabetes Brother    Hypertension Brother    Diabetes Brother    Hypertension Brother    Hypertension Brother    Breast cancer Neg Hx    Allergic rhinitis Neg Hx    Asthma Neg Hx    Eczema Neg Hx    Urticaria Neg Hx    Social History   Socioeconomic History   Marital status: Married    Spouse name: Kofi   Number of children: 1   Years of education: 12   Highest education level: Master's degree (e.g., MA, MS, MEng, MEd, MSW, MBA)  Occupational History   Occupation: retired Forensic psychologist  Tobacco Use   Smoking status: Never    Smokeless tobacco: Never  Vaping Use   Vaping Use: Never used  Substance and Sexual Activity   Alcohol use: No    Comment: wine- occ.   Drug use: No   Sexual activity: Yes    Birth control/protection: Surgical    Comment: tubaligation  Other Topics Concern   Not on file  Social History Narrative   Not on file   Social Determinants of Health   Financial Resource Strain: Low Risk  (09/17/2021)   Overall Financial Resource Strain (CARDIA)    Difficulty of Paying Living Expenses: Not hard at all  Food Insecurity: No Food Insecurity (09/17/2021)   Hunger Vital Sign    Worried About Running Out of Food in the Last Year: Never true    Ran Out of Food in the Last Year: Never true  Transportation Needs: No Transportation Needs (09/17/2021)   PRAPARE - Hydrologist (Medical): No    Lack of Transportation (Non-Medical): No  Physical Activity: Sufficiently Active (09/17/2021)   Exercise Vital Sign    Days of Exercise per Week: 4 days    Minutes of Exercise per Session: 80 min  Stress: No Stress Concern Present (09/17/2021)   [place]    Feeling of Stress : Not at all  Social Connections: [place] (09/17/2021)   Social Connection and Isolation Panel [NHANES]    Frequency of Communication with Friends and Family: More than three times a week    Frequency of Social Gatherings with Friends and Family: Once a week    Attends Religious Services: More than 4 times per year    Active Member of [employer] or Organizations: Yes    Attends Music therapist: More than 4 times per year    Marital Status: Married    Tobacco Counseling Counseling given: Not Answered   Clinical Intake:  Pre-visit preparation completed: Yes  Pain : 0-10 Pain Score: 3  (Left side sciatica, has follow up for this)     Diabetes: No  How often do you need to have someone help you when you read  instructions, pamphlets, or other written materials from your doctor or pharmacy?: 1 - Never What is the last grade level you completed in school?: [place] of Daily Living    09/20/2022    2:28 PM 09/19/2022    4:59 PM  In your present state of health, do you have any difficulty performing the following activities:  Hearing?  0 0  Vision? 0 0  Difficulty concentrating or making decisions? 0 0  Walking or climbing stairs? 0 1  Dressing or bathing? 0 0  Doing errands, shopping? 0 0  Preparing Food and eating ?  N  Using the Toilet?  N  In the past six months, have you accidently leaked urine?  N  Do you have problems with loss of bowel control?  N  Managing your Medications?  N  Managing your Finances?  N  Housekeeping or managing your Housekeeping?  N    Patient Care Team: Fayrene Helper, MD as PCP - General (Family Medicine) Josue Hector, MD as Attending Physician (Cardiology) Himmelrich, Bryson Ha, RD (Inactive) as Dietitian Tia Masker)  Indicate any recent Medical Services you may have received from other than [facility] providers in the past year (date may be approximate).     Assessment:   This is a routine wellness examination for [place].  Hearing/Vision screen No results found.  Dietary issues and exercise activities discussed:     Goals Addressed   None    Depression Screen    09/20/2022    2:28 PM 07/15/2022    8:12 AM 09/17/2021    9:22 AM 09/15/2021    8:12 AM 03/04/2021    8:10 AM 09/02/2020    8:54 AM 05/03/2019    8:31 AM  PHQ 2/9 Scores  PHQ - 2 Score 0 0 0 0 0 0 0    Fall Risk    09/19/2022    4:59 PM 07/15/2022    8:12 AM 03/17/2022    8:12 AM 09/17/2021    9:31 AM 09/15/2021   10:25 AM  [place] in the past year? 0 0 1 0 0  Number falls in past yr: 0 0 0 0 0  Injury with Fall? 0 0 0 0 0  Risk for fall due to :  No Fall Risks  Orthopedic patient   Follow up  Falls evaluation completed  Falls evaluation completed      FALL RISK PREVENTION PERTAINING TO THE HOME:  Any stairs in or around the home? Yes  If so, are there any without handrails? Yes  Home free of loose throw rugs in walkways, pet beds, electrical cords, etc? Yes  Adequate lighting in your home to reduce risk of falls? Yes   ASSISTIVE DEVICES UTILIZED TO PREVENT FALLS:  Life alert? No  Use of a cane, walker or w/c? No  Grab bars in the bathroom? No  Shower chair or bench in shower? Yes  Elevated toilet seat or a handicapped toilet? No    Cognitive Function:        09/20/2022    2:28 PM 09/17/2021    9:34 AM 09/02/2020    9:03 AM  6CIT Screen  What Year? 0 points 0 points 0 points  What month? 0 points 0 points 0 points  What time? 0 points 0 points 0 points  Count back from 20 0 points 0 points 0 points  Months in reverse 0 points 0 points 2 points  Repeat phrase 0 points 0 points 0 points  Total Score 0 points 0 points 2 points    Immunizations Immunization History  Administered Date(s) Administered   Fluad Quad(high Dose 65+) 09/02/2020, 05/26/2022   Influenza,inj,Quad PF,6+ Mos 09/04/2015, 07/14/2016, 09/09/2017, 07/20/2018, 05/03/2019   Influenza-Unspecified 04/18/2019, 07/28/2021   Moderna Covid-19 Vaccine Bivalent Booster 33yr & up 07/09/2022   PFIZER(Purple Top)SARS-COV-2 Vaccination 11/05/2019, 11/26/2019,  06/17/2020   Pfizer Covid-19 Vaccine Bivalent Booster 35yr & up 07/27/2021   Pneumococcal Conjugate-13 04/29/2014   Pneumococcal Polysaccharide-23 03/06/2020   Tdap 02/12/2014   Varicella 04/13/2016   Zoster Recombinat (Shingrix) 09/22/2018, 01/03/2019   Zoster, Live 10/17/2014, 10/31/2014    TDAP status: Up to date  Flu Vaccine status: Up to date  Pneumococcal vaccine status: Up to date  Covid-19 vaccine status: Completed vaccines  Qualifies for Shingles Vaccine? Yes   Zostavax completed No   Shingrix Completed?: Yes  Screening Tests Health Maintenance  Topic Date Due   COVID-19 Vaccine (6  - 2023-24 season) 09/03/2022   Medicare Annual Wellness (AWV)  09/17/2022   Fecal DNA (Cologuard)  09/24/2023   DTaP/Tdap/Td (2 - Td or Tdap) 02/13/2024   MAMMOGRAM  06/02/2024   Pneumonia Vaccine 68 Years old  Completed   INFLUENZA VACCINE  Completed   DEXA SCAN  Completed   Hepatitis C Screening  Completed   Zoster Vaccines- Shingrix  Completed   HPV VACCINES  Aged Out   COLONOSCOPY (Pts 45-443yrInsurance coverage will need to be confirmed)  Discontinued    Health Maintenance  Health Maintenance Due  Topic Date Due   COVID-19 Vaccine (6 - 2023-24 season) 09/03/2022   Medicare Annual Wellness (AWV)  09/17/2022    Colorectal cancer screening: Type of screening: Cologuard. Completed 2022.   Mammogram status: Completed 06/02/2022. Repeat every year  Bone Density status: Completed 7/. Results reflect: Bone density results: NORMAL  Lung Cancer Screening: (Low Dose CT Chest recommended if Age 68-80ears, 30 pack-year currently smoking OR have quit w/in 15years.) does not qualify.    Additional Screening:  Hepatitis C Screening: does qualify; Completed   Vision Screening: Recommended annual ophthalmology exams for early detection of glaucoma and other disorders of the eye. Is the patient up to date with their annual eye exam?  Yes  Who is the provider or what is the name of the office in which the patient attends annual eye exams? [facility] pt is not established with a provider, would they like to be referred to a provider to establish care? No .   Dental Screening: Recommended annual dental exams for proper oral hygiene  Community Resource Referral / Chronic Care Management: CRR required this visit?  No   CCM required this visit?  No      Plan:     I have personally reviewed and noted the following in the patient's chart:   Medical and social history Use of alcohol, tobacco or illicit drugs  Current medications and supplements including opioid prescriptions.  Patient is not currently taking opioid prescriptions. Functional ability and status Nutritional status Physical activity Advanced directives List of other physicians Hospitalizations, surgeries, and ER visits in previous 12 months Vitals Screenings to include cognitive, depression, and falls Referrals and appointments  In addition, I have reviewed and discussed with patient certain preventive protocols, quality metrics, and best practice recommendations. A written personalized care plan for preventive services as well as general preventive health recommendations were provided to patient.     JeLorene DyMD   09/20/2022

## 2022-09-21 DIAGNOSIS — R001 Bradycardia, unspecified: Secondary | ICD-10-CM | POA: Diagnosis not present

## 2022-09-22 ENCOUNTER — Encounter: Payer: Self-pay | Admitting: Family Medicine

## 2022-09-22 ENCOUNTER — Other Ambulatory Visit: Payer: Self-pay

## 2022-09-22 DIAGNOSIS — R001 Bradycardia, unspecified: Secondary | ICD-10-CM

## 2022-09-22 LAB — BMP8+EGFR
BUN/Creatinine Ratio: 24 (ref 12–28)
BUN: 15 mg/dL (ref 8–27)
CO2: 23 mmol/L (ref 20–29)
Calcium: 9 mg/dL (ref 8.7–10.3)
Chloride: 103 mmol/L (ref 96–106)
Creatinine, Ser: 0.63 mg/dL (ref 0.57–1.00)
Glucose: 90 mg/dL (ref 70–99)
Potassium: 4 mmol/L (ref 3.5–5.2)
Sodium: 140 mmol/L (ref 134–144)
eGFR: 97 mL/min/{1.73_m2} (ref >59)

## 2022-09-22 LAB — MAGNESIUM: Magnesium: 2.1 mg/dL (ref 1.6–2.3)

## 2022-09-22 NOTE — Telephone Encounter (Signed)
Referral sent 

## 2022-09-28 DIAGNOSIS — M4316 Spondylolisthesis, lumbar region: Secondary | ICD-10-CM | POA: Diagnosis not present

## 2022-09-28 DIAGNOSIS — M48061 Spinal stenosis, lumbar region without neurogenic claudication: Secondary | ICD-10-CM | POA: Diagnosis not present

## 2022-09-28 DIAGNOSIS — I739 Peripheral vascular disease, unspecified: Secondary | ICD-10-CM | POA: Diagnosis not present

## 2022-09-28 DIAGNOSIS — M549 Dorsalgia, unspecified: Secondary | ICD-10-CM | POA: Diagnosis not present

## 2022-09-28 DIAGNOSIS — M5416 Radiculopathy, lumbar region: Secondary | ICD-10-CM | POA: Diagnosis not present

## 2022-10-07 NOTE — Progress Notes (Signed)
CARDIOLOGY CONSULT NOTE       Patient ID: Stefanie Taylor MRN: [id number] DOB/AGE: 1956/02/18 67 y.o.  Admit date: (Not on file) Referring Physician: Moshe Cipro Primary Physician: Fayrene Helper, MD Primary Cardiologist: New Reason for Consultation: Bradycardia   HPI:  67 y.o. referred by Dr Moshe Cipro for bradycardia. Office visit 09/20/22 pulse recorded at 40 bpm with normal BP History of obesity with gastric bypass. Chronic back pain Severe spinal stenosis at L3-5 referred to neurosurgery No renal failure K 4 Not on any AV nodal blocking drugs TSH normal PMH includies HTN but currently on no medication ECG done in office did not show pulse of 40 Showed SR rate 78 with PAC;s No AV block Susppect CNA did not auscultate pulse and noted a pulse deficit from PAC;s  NO history of chest pain, syncope Functional dyspnea   Seen by Dr Geraldo Pitter [phone] with normal echo 06/02/17 and normal myovue 06/01/17 with EF 66%  She is out of the house daily Goes to gym and cycles bike and is in pool. Her sciatica limits her more than anything No chest pain , dyspnea, palpitations or syncope She was having bigeminal rhythm today and was unaware of it. There was a pulse deficit at the radial level with the skipped beats not palpable  ROS All other systems reviewed and negative except as noted above  Past Medical History:  Diagnosis Date   Bradycardia    H/O sickle cell trait    Hypertension    Morbid obesity with BMI of 50.0-59.9, adult ([place]) 08/15/2012   BMI: 53.1 kg/m^2    Family History  Problem Relation Age of Onset   Hypertension Mother    Heart disease Mother    Cancer Mother    Hypertension Father    Hypertension Sister    Alcohol abuse Sister    Diabetes Brother    Hypertension Brother    Diabetes Sister    Hypertension Sister    Diabetes Sister    Hypertension Sister    Diabetes Brother    Hypertension Brother    Diabetes Brother    Hypertension Brother    Diabetes Brother     Hypertension Brother    Hypertension Brother    Breast cancer Neg Hx    Allergic rhinitis Neg Hx    Asthma Neg Hx    Eczema Neg Hx    Urticaria Neg Hx     Social History   Socioeconomic History   Marital status: Married    Spouse name: Kofi   Number of children: 1   Years of education: 12   Highest education level: Master's degree (e.g., MA, MS, MEng, MEd, MSW, MBA)  Occupational History   Occupation: retired Forensic psychologist  Tobacco Use   Smoking status: Never   Smokeless tobacco: Never  Vaping Use   Vaping Use: Never used  Substance and Sexual Activity   Alcohol use: No    Comment: wine- occ.   Drug use: No   Sexual activity: Yes    Birth control/protection: Surgical    Comment: tubaligation  Other Topics Concern   Not on file  Social History Narrative   Not on file   Social Determinants of Health   Financial Resource Strain: Low Risk  (09/17/2021)   Overall Financial Resource Strain (CARDIA)    Difficulty of Paying Living Expenses: Not hard at all  Food Insecurity: No Food Insecurity (09/17/2021)   Hunger Vital Sign    Worried About Charity fundraiser in  the Last Year: Never true    [place] in the Last Year: Never true  Transportation Needs: No Transportation Needs (09/17/2021)   PRAPARE - Hydrologist (Medical): No    Lack of Transportation (Non-Medical): No  Physical Activity: Sufficiently Active (09/17/2021)   Exercise Vital Sign    Days of Exercise per Week: 4 days    Minutes of Exercise per Session: 80 min  Stress: No Stress Concern Present (09/17/2021)   [place]    Feeling of Stress : Not at all  Social Connections: [place] (09/17/2021)   Social Connection and Isolation Panel [NHANES]    Frequency of Communication with Friends and Family: More than three times a week    Frequency of Social Gatherings with Friends and Family: Once a week    Attends  Religious Services: More than 4 times per year    Active Member of [employer] or Organizations: Yes    Attends Archivist Meetings: More than 4 times per year    Marital Status: Married  Human resources officer Violence: Not At Risk (09/17/2021)   Humiliation, Afraid, Rape, and Kick questionnaire    Fear of Current or Ex-Partner: No    Emotionally Abused: No    Physically Abused: No    Sexually Abused: No    Past Surgical History:  Procedure Laterality Date   BIOPSY BREAST     BREAST BIOPSY     BREAST EXCISIONAL BIOPSY Right    BREAST SURGERY  1998   biopsy mole on right breast   BREATH TEK H PYLORI  09/04/2012   Procedure: BREATH TEK H PYLORI;  Surgeon: Shann Medal, MD;  Location: [facility];  Service: General;  Laterality: N/A;   BREATH TEK H PYLORI N/A 11/06/2012   Procedure: Lauris Chroman;  Surgeon: Shann Medal, MD;  Location: [facility];  Service: General;  Laterality: N/A;   COLONOSCOPY WITH PROPOFOL N/A 05/28/2016   Procedure: COLONOSCOPY WITH PROPOFOL;  Surgeon: Carol Ada, MD;  Location: [facility];  Service: Endoscopy;  Laterality: N/A;   GASTRIC BYPASS  09/07/2017   TUBAL LIGATION     UMBILICAL HERNIA REPAIR     age 4      Current Outpatient Medications:    cetirizine (ZYRTEC) 10 MG tablet, TAKE 1 TABLET(10 MG) BY MOUTH DAILY, Disp: 90 tablet, Rfl: 3   gabapentin (NEURONTIN) 100 MG capsule, Take 100 mg by mouth at bedtime., Disp: , Rfl:    hydrocortisone 2.5 % ointment, Apply topically 2 (two) times daily., Disp: 30 g, Rfl: 2   montelukast (SINGULAIR) 10 MG tablet, Take 1 tablet (10 mg total) by mouth at bedtime., Disp: 90 tablet, Rfl: 3   Multiple Vitamin (MULTIVITAMIN ADULT PO), , Disp: , Rfl:    triamcinolone 0.1% oint-Eucerin equivalent cream 1:1 mixture, Use 1 application twice a day as needed to red itchy areas. Do not use on face, neck, groin, or armpit region, Disp: 45 g, Rfl: 3   triamcinolone ointment (KENALOG) 0.1 %, Apply 1 application  topically 2 (two) times daily as needed., Disp: 454 g, Rfl: 3  Current Facility-Administered Medications:    omalizumab Arvid Right) prefilled syringe 150 mg, 150 mg, Subcutaneous, Q14 Days, Valentina Shaggy, MD, 150 mg at 04/16/22 1344  omalizumab  150 mg Subcutaneous Q14 Days     Physical Exam: Blood pressure 124/70, pulse (!) 36, height '5\' 5"'$  (1.651 m), weight 237  lb 12.8 oz (107.9 kg), SpO2 98 %.    Over weight female Lungs clear Normal heart sounds Abdomen benign  Trace edema Palpable pedal pulses   Labs:   Lab Results  Component Value Date   WBC 3.5 09/16/2021   HGB 12.2 09/16/2021   HCT 37.2 09/16/2021   MCV 89 09/16/2021   PLT 175 09/16/2021   No results for input(s): "NA", "K", "CL", "CO2", "BUN", "CREATININE", "CALCIUM", "PROT", "BILITOT", "ALKPHOS", "ALT", "AST", "GLUCOSE" in the last 168 hours.  Invalid input(s): "LABALBU" No results found for: "CKTOTAL", "CKMB", "CKMBINDEX", "TROPONINI"  Lab Results  Component Value Date   CHOL 151 03/17/2022   CHOL 142 03/04/2021   CHOL 131 03/13/2020   Lab Results  Component Value Date   HDL 79 03/17/2022   HDL 51 03/04/2021   HDL 62 03/13/2020   Lab Results  Component Value Date   LDLCALC 60 03/17/2022   LDLCALC 80 03/04/2021   LDLCALC 57 03/13/2020   Lab Results  Component Value Date   TRIG 60 03/17/2022   TRIG 49 03/04/2021   TRIG 42 03/13/2020   Lab Results  Component Value Date   CHOLHDL 1.9 03/17/2022   CHOLHDL 2.8 03/04/2021   CHOLHDL 2.1 03/13/2020   No results found for: "LDLDIRECT"    Radiology: No results found.  EKG: see HPI   ASSESSMENT AND PLAN:   Bradycardia:  none - pulse deficit from PACls ECG with no AV block Labs ok Not on nodal drugs Will order 48 hour holter to assess frequency of PACls and make sure no nocturnal bradycardia from OSA Prior echo and myovue normal will update TTE She thinks she can walk far enough on treadmill to see if any chronotropic incompetence and for me  to see if ectopy/bigeminal rhythm Can consider MRI to r/o sarcoid if TTE abnormal in any way disappears with higher sympathetic tone  HTN:  Diet and weight loss currently not on medication Spinal Stenosis:  referred to neurosurgery by primary Clear to have general anesthesia and proceed from cardiac standpoint She is now on gabapentin and has f/u at [facility]  48 hour holter TTE ETT  F/U after testing  Signed: Jenkins Rouge 10/18/2022, 8:29 AM

## 2022-10-18 ENCOUNTER — Ambulatory Visit: Payer: Medicare PPO | Attending: Cardiovascular Disease | Admitting: Cardiovascular Disease

## 2022-10-18 ENCOUNTER — Ambulatory Visit: Payer: Medicare PPO | Attending: Cardiovascular Disease

## 2022-10-18 ENCOUNTER — Encounter: Payer: Self-pay | Admitting: Cardiovascular Disease

## 2022-10-18 VITALS — BP 124/70 | HR 36 | Ht 65.0 in | Wt 237.8 lb

## 2022-10-18 DIAGNOSIS — I10 Essential (primary) hypertension: Secondary | ICD-10-CM | POA: Diagnosis not present

## 2022-10-18 DIAGNOSIS — M5442 Lumbago with sciatica, left side: Secondary | ICD-10-CM

## 2022-10-18 DIAGNOSIS — R001 Bradycardia, unspecified: Secondary | ICD-10-CM

## 2022-10-18 NOTE — Patient Instructions (Addendum)
Medication Instructions:  Your physician recommends that you continue on your current medications as directed. Please refer to the Current Medication list given to you today.  *If you need a refill on your cardiac medications before your next appointment, please call your pharmacy*  Lab Work: If you have labs (blood work) drawn today and your tests are completely normal, you will receive your results only by: [place] (if you have MyChart) OR A paper copy in the mail If you have any lab test that is abnormal or we need to change your treatment, we will call you to review the results.  Testing/Procedures: Your physician has requested that you have an echocardiogram. Echocardiography is a painless test that uses sound waves to create images of your heart. It provides your doctor with information about the size and shape of your heart and how well your heart's chambers and valves are working. This procedure takes approximately one hour. There are no restrictions for this procedure. Please do NOT wear cologne, perfume, aftershave, or lotions (deodorant is allowed). Please arrive 15 minutes prior to your appointment time.  Your physician has recommended that you wear an event monitor. Event monitors are medical devices that record the heart's electrical activity. Doctors most often [place] these monitors to diagnose arrhythmias. Arrhythmias are problems with the speed or rhythm of the heartbeat. The monitor is a small, portable device. You can wear one while you do your normal daily activities. This is usually used to diagnose what is causing palpitations/syncope (passing out).  Your physician has requested that you have an exercise tolerance test. For further information please visit [email or web address]. Please also follow instruction sheet, as given.  Follow-Up: At [facility], you and your health needs are our priority.  As part of our continuing mission to provide you with exceptional  heart care, we have created designated Provider Care Teams.  These Care Teams include your primary Cardiologist (physician) and Advanced Practice Providers (APPs -  Physician Assistants and Nurse Practitioners) who all work together to provide you with the care you need, when you need it.  We recommend signing up for the patient portal called "MyChart".  Sign up information is provided on this After Visit Summary.  MyChart is used to connect with patients for Virtual Visits (Telemedicine).  Patients are able to view lab/test results, encounter notes, upcoming appointments, etc.  Non-urgent messages can be sent to your provider as well.   To learn more about what you can do with MyChart, go to [email or web address].    Your next appointment:   6 weeks after test complete  Provider:   Jenkins Rouge, MD

## 2022-10-18 NOTE — Progress Notes (Unsigned)
Enrolled for [employer] to mail a ZIO XT long term holter monitor to the patients address on file.  

## 2022-10-19 DIAGNOSIS — M170 Bilateral primary osteoarthritis of knee: Secondary | ICD-10-CM | POA: Diagnosis not present

## 2022-10-22 ENCOUNTER — Telehealth: Payer: Self-pay | Admitting: Cardiovascular Disease

## 2022-10-22 DIAGNOSIS — R001 Bradycardia, unspecified: Secondary | ICD-10-CM

## 2022-10-22 NOTE — Telephone Encounter (Signed)
Returned call to patient she will come today at 4 pm to have monitor applied

## 2022-10-22 NOTE — Telephone Encounter (Signed)
Pt called in and stated she rec'd the heart monitor and would like to come to the office or have someone show her how to put it on.    Best number [phone]

## 2022-10-29 ENCOUNTER — Other Ambulatory Visit: Payer: Self-pay | Admitting: Family Medicine

## 2022-10-29 DIAGNOSIS — R001 Bradycardia, unspecified: Secondary | ICD-10-CM | POA: Diagnosis not present

## 2022-11-09 ENCOUNTER — Ambulatory Visit: Payer: Medicare PPO | Attending: Cardiovascular Disease

## 2022-11-09 ENCOUNTER — Ambulatory Visit (INDEPENDENT_AMBULATORY_CARE_PROVIDER_SITE_OTHER): Payer: Medicare PPO

## 2022-11-09 DIAGNOSIS — R001 Bradycardia, unspecified: Secondary | ICD-10-CM | POA: Diagnosis not present

## 2022-11-09 DIAGNOSIS — I361 Nonrheumatic tricuspid (valve) insufficiency: Secondary | ICD-10-CM | POA: Diagnosis not present

## 2022-11-09 LAB — EXERCISE TOLERANCE TEST
Angina Index: 0
Duke Treadmill Score: 5
Estimated workload: 6.3
Exercise duration (min): 5 min
Exercise duration (sec): 7 s
MPHR: 153 {beats}/min
Peak HR: 148 {beats}/min
Percent HR: 96 %
RPE: 15
Rest HR: 72 {beats}/min
ST Depression (mm): 0 mm

## 2022-11-09 LAB — ECHOCARDIOGRAM COMPLETE
Area-P 1/2: 2.75 cm2
S' Lateral: 2.7 cm

## 2022-11-10 ENCOUNTER — Telehealth: Payer: Self-pay

## 2022-11-10 NOTE — Telephone Encounter (Signed)
-----   Message from Josue Hector, MD sent at 11/10/2022  7:41 AM EST ----- Echo looks great normal EF no bad valves

## 2022-11-10 NOTE — Telephone Encounter (Signed)
Patient notified and verbalized understanding. Patient had no questions or concerns at this time. 

## 2022-11-10 NOTE — Telephone Encounter (Signed)
-----   Message from Josue Hector, MD sent at 11/10/2022  7:38 AM EST ----- Normal stress test

## 2022-12-01 NOTE — Addendum Note (Signed)
Addended by: Lyndal Pulley on: 12/01/2022 10:26 AM   Modules accepted: Orders, Level of Service

## 2022-12-27 ENCOUNTER — Telehealth: Payer: Self-pay

## 2022-12-27 NOTE — Telephone Encounter (Signed)
Called and left a message for patient to call our office back to see when she wanted to reschedule her appointments for her Xolair and to see the provider as she is overdue. I also wanted to inform her that her Xolair will expires in July of this year. If patient wants to stop she would need to sign discontinuation forms.

## 2023-01-03 NOTE — Progress Notes (Signed)
CARDIOLOGY CONSULT NOTE       Patient ID: Stefanie Taylor MRN: [id number] DOB/AGE: 68-Feb-1957 68 y.o.  Admit date: (Not on file) Referring Physician: Lodema Hong Primary Physician: Kerri Perches, MD Primary Cardiologist: Eden Emms Reason for Consultation: Bradycardia   HPI:  67 y.o. referred by Dr Lodema Hong for bradycardia. Office visit 09/20/22 pulse recorded at 40 bpm with normal BP  First seen by me 10/18/22 History of obesity with gastric bypass. Chronic back pain Severe spinal stenosis at L3-5 referred to neurosurgery No renal failure K 4 Not on any AV nodal blocking drugs TSH normal PMH includies HTN but currently on no medication ECG done in office did not show pulse of 40 Showed SR rate 78 with PAC;s No AV block Susppect CNA did not auscultate pulse and noted a pulse deficit from PAC;s  NO history of chest pain, syncope Functional dyspnea   Seen by Dr Tomie China [phone] with normal echo 06/02/17 and normal myovue 06/01/17 with EF 66%  She is out of the house daily Goes to gym and cycles bike and is in pool. Her sciatica limits her more than anything No chest pain , dyspnea, palpitations or syncope She was having bigeminal rhythm today and was unaware of it. There was a pulse deficit at the radial level with the skipped beats not palpable  Monitor 11/01/22 average HR 63 bpm no AV block TTE done 11/09/22 normal EF 60-65%  ETT 10/20/22 max HR 148 bpm no ischemia  Patient has pulse deficit today from PACls She went to [place] on her own this year She goes to [place] With her husband as that's where she is from Has family reunion in [place] in July   ROS All other systems reviewed and negative except as noted above  Past Medical History:  Diagnosis Date   Bradycardia    H/O sickle cell trait    Hypertension    Morbid obesity with BMI of 50.0-59.9, adult ([facility]) 08/15/2012   BMI: 53.1 kg/m^2    Family History  Problem Relation Age of Onset   Hypertension Mother    Heart disease Mother     Cancer Mother    Hypertension Father    Hypertension Sister    Alcohol abuse Sister    Diabetes Brother    Hypertension Brother    Diabetes Sister    Hypertension Sister    Diabetes Sister    Hypertension Sister    Diabetes Brother    Hypertension Brother    Diabetes Brother    Hypertension Brother    Diabetes Brother    Hypertension Brother    Hypertension Brother    Breast cancer Neg Hx    Allergic rhinitis Neg Hx    Asthma Neg Hx    Eczema Neg Hx    Urticaria Neg Hx     Social History   Socioeconomic History   Marital status: Married    Spouse name: Kofi   Number of children: 1   Years of education: 12   Highest education level: Master's degree (e.g., MA, MS, MEng, MEd, MSW, MBA)  Occupational History   Occupation: retired Pensions consultant  Tobacco Use   Smoking status: Never   Smokeless tobacco: Never  Vaping Use   Vaping Use: Never used  Substance and Sexual Activity   Alcohol use: No    Comment: wine- occ.   Drug use: No   Sexual activity: Yes    Birth control/protection: Surgical    Comment: tubaligation  Other Topics Concern  Not on file  Social History Narrative   Not on file   Social Determinants of Health   Financial Resource Strain: Low Risk  (09/17/2021)   Overall Financial Resource Strain (CARDIA)    Difficulty of Paying Living Expenses: Not hard at all  Food Insecurity: No Food Insecurity (09/17/2021)   Hunger Vital Sign    Worried About Running Out of Food in the Last Year: Never true    Ran Out of Food in the Last Year: Never true  Transportation Needs: No Transportation Needs (09/17/2021)   PRAPARE - Administrator, Civil Service (Medical): No    Lack of Transportation (Non-Medical): No  Physical Activity: Sufficiently Active (09/17/2021)   Exercise Vital Sign    Days of Exercise per Week: 4 days    Minutes of Exercise per Session: 80 min  Stress: No Stress Concern Present (09/17/2021)   [employer] of Occupational Health -  Occupational Stress Questionnaire    Feeling of Stress : Not at all  Social Connections: Socially Integrated (09/17/2021)   Social Connection and Isolation Panel [NHANES]    Frequency of Communication with Friends and Family: More than three times a week    Frequency of Social Gatherings with Friends and Family: Once a week    Attends Religious Services: More than 4 times per year    Active Member of [employer] or Organizations: Yes    Attends Banker Meetings: More than 4 times per year    Marital Status: Married  Catering manager Violence: Not At Risk (09/17/2021)   Humiliation, Afraid, Rape, and Kick questionnaire    Fear of Current or Ex-Partner: No    Emotionally Abused: No    Physically Abused: No    Sexually Abused: No    Past Surgical History:  Procedure Laterality Date   BIOPSY BREAST     BREAST BIOPSY     BREAST EXCISIONAL BIOPSY Right    BREAST SURGERY  1998   biopsy mole on right breast   BREATH TEK H PYLORI  09/04/2012   Procedure: BREATH TEK H PYLORI;  Surgeon: Kandis Cocking, MD;  Location: [facility];  Service: General;  Laterality: N/A;   BREATH TEK H PYLORI N/A 11/06/2012   Procedure: [place];  Surgeon: Kandis Cocking, MD;  Location: [facility];  Service: General;  Laterality: N/A;   COLONOSCOPY WITH PROPOFOL N/A 05/28/2016   Procedure: COLONOSCOPY WITH PROPOFOL;  Surgeon: Jeani Hawking, MD;  Location: [facility];  Service: Endoscopy;  Laterality: N/A;   GASTRIC BYPASS  09/07/2017   TUBAL LIGATION     UMBILICAL HERNIA REPAIR     age 6      Current Outpatient Medications:    cetirizine (ZYRTEC) 10 MG tablet, TAKE 1 TABLET(10 MG) BY MOUTH DAILY, Disp: 90 tablet, Rfl: 3   gabapentin (NEURONTIN) 100 MG capsule, Take 100 mg by mouth daily at 6 (six) AM., Disp: , Rfl:    hydrocortisone 2.5 % ointment, Apply topically 2 (two) times daily., Disp: 30 g, Rfl: 2   montelukast (SINGULAIR) 10 MG tablet, TAKE 1 TABLET(10 MG) BY MOUTH AT BEDTIME, Disp:  90 tablet, Rfl: 3   Multiple Vitamin (MULTIVITAMIN ADULT PO), , Disp: , Rfl:    triamcinolone 0.1% oint-Eucerin equivalent cream 1:1 mixture, Use 1 application twice a day as needed to red itchy areas. Do not use on face, neck, groin, or armpit region, Disp: 45 g, Rfl: 3   triamcinolone ointment (KENALOG) 0.1 %,  Apply 1 application topically 2 (two) times daily as needed., Disp: 454 g, Rfl: 3  Current Facility-Administered Medications:    omalizumab Geoffry Paradise) prefilled syringe 150 mg, 150 mg, Subcutaneous, Q14 Days, Alfonse Spruce, MD, 150 mg at 04/16/22 1344  omalizumab  150 mg Subcutaneous Q14 Days     Physical Exam: There were no vitals taken for this visit.    Over weight female Lungs clear Normal heart sounds Abdomen benign  Trace edema Palpable pedal pulses   Labs:   Lab Results  Component Value Date   WBC 3.5 09/16/2021   HGB 12.2 09/16/2021   HCT 37.2 09/16/2021   MCV 89 09/16/2021   PLT 175 09/16/2021   No results for input(s): "NA", "K", "CL", "CO2", "BUN", "CREATININE", "CALCIUM", "PROT", "BILITOT", "ALKPHOS", "ALT", "AST", "GLUCOSE" in the last 168 hours.  Invalid input(s): "LABALBU" No results found for: "CKTOTAL", "CKMB", "CKMBINDEX", "TROPONINI"  Lab Results  Component Value Date   CHOL 151 03/17/2022   CHOL 142 03/04/2021   CHOL 131 03/13/2020   Lab Results  Component Value Date   HDL 79 03/17/2022   HDL 51 03/04/2021   HDL 62 03/13/2020   Lab Results  Component Value Date   LDLCALC 60 03/17/2022   LDLCALC 80 03/04/2021   LDLCALC 57 03/13/2020   Lab Results  Component Value Date   TRIG 60 03/17/2022   TRIG 49 03/04/2021   TRIG 42 03/13/2020   Lab Results  Component Value Date   CHOLHDL 1.9 03/17/2022   CHOLHDL 2.8 03/04/2021   CHOLHDL 2.1 03/13/2020   No results found for: "LDLDIRECT"    Radiology: No results found.  EKG: see HPI  01/11/2023 SR rate 63 atrial bigeminny otherwise normal   ASSESSMENT AND PLAN:   Bradycardia:   none - pulse deficit from PACls ECG with no AV block Labs ok Not on nodal drugs Monitor with average HR 63 bpm  and peak HR with exercise 148 bpm No indication for PPM  HTN:  Diet and weight loss currently not on medication Spinal Stenosis:  referred to neurosurgery by primary Clear to have general anesthesia and proceed from cardiac standpoint She is now on gabapentin and has f/u at [facility]   F/U in a year   Signed: Charlton Haws 01/11/2023, 8:34 AM

## 2023-01-11 ENCOUNTER — Ambulatory Visit: Payer: Medicare PPO | Attending: Cardiovascular Disease | Admitting: Cardiovascular Disease

## 2023-01-11 ENCOUNTER — Encounter: Payer: Self-pay | Admitting: Cardiovascular Disease

## 2023-01-11 VITALS — BP 112/74 | HR 63 | Ht 65.0 in | Wt 224.0 lb

## 2023-01-11 DIAGNOSIS — R001 Bradycardia, unspecified: Secondary | ICD-10-CM

## 2023-01-11 DIAGNOSIS — I10 Essential (primary) hypertension: Secondary | ICD-10-CM | POA: Diagnosis not present

## 2023-01-11 DIAGNOSIS — M5442 Lumbago with sciatica, left side: Secondary | ICD-10-CM | POA: Diagnosis not present

## 2023-01-11 NOTE — Patient Instructions (Signed)
Medication Instructions:  Your physician recommends that you continue on your current medications as directed. Please refer to the Current Medication list given to you today.  *If you need a refill on your cardiac medications before your next appointment, please call your pharmacy*  Lab Work: If you have labs (blood work) drawn today and your tests are completely normal, you will receive your results only by: MyChart Message (if you have MyChart) OR A paper copy in the mail If you have any lab test that is abnormal or we need to change your treatment, we will call you to review the results.  Testing/Procedures: None ordered today.  Follow-Up: At [facility], you and your health needs are our priority.  As part of our continuing mission to provide you with exceptional heart care, we have created designated Provider Care Teams.  These Care Teams include your primary Cardiologist (physician) and Advanced Practice Providers (APPs -  Physician Assistants and Nurse Practitioners) who all work together to provide you with the care you need, when you need it.  We recommend signing up for the patient portal called "MyChart".  Sign up information is provided on this After Visit Summary.  MyChart is used to connect with patients for Virtual Visits (Telemedicine).  Patients are able to view lab/test results, encounter notes, upcoming appointments, etc.  Non-urgent messages can be sent to your provider as well.   To learn more about what you can do with MyChart, go to [email or web address].    Your next appointment:   1 year(s)  Provider:   Peter Nishan, MD      

## 2023-01-12 DIAGNOSIS — M4316 Spondylolisthesis, lumbar region: Secondary | ICD-10-CM | POA: Diagnosis not present

## 2023-01-12 DIAGNOSIS — M48061 Spinal stenosis, lumbar region without neurogenic claudication: Secondary | ICD-10-CM | POA: Diagnosis not present

## 2023-01-12 DIAGNOSIS — M5416 Radiculopathy, lumbar region: Secondary | ICD-10-CM | POA: Diagnosis not present

## 2023-01-13 ENCOUNTER — Ambulatory Visit: Payer: Medicare PPO | Admitting: Family Medicine

## 2023-01-13 ENCOUNTER — Encounter: Payer: Self-pay | Admitting: Family Medicine

## 2023-01-13 VITALS — BP 128/72 | HR 68 | Ht 65.0 in | Wt 224.0 lb

## 2023-01-13 DIAGNOSIS — G8929 Other chronic pain: Secondary | ICD-10-CM

## 2023-01-13 DIAGNOSIS — E0789 Other specified disorders of thyroid: Secondary | ICD-10-CM | POA: Diagnosis not present

## 2023-01-13 DIAGNOSIS — Z13220 Encounter for screening for lipoid disorders: Secondary | ICD-10-CM | POA: Diagnosis not present

## 2023-01-13 DIAGNOSIS — J3089 Other allergic rhinitis: Secondary | ICD-10-CM | POA: Diagnosis not present

## 2023-01-13 DIAGNOSIS — E559 Vitamin D deficiency, unspecified: Secondary | ICD-10-CM | POA: Diagnosis not present

## 2023-01-13 DIAGNOSIS — M5442 Lumbago with sciatica, left side: Secondary | ICD-10-CM | POA: Diagnosis not present

## 2023-01-13 DIAGNOSIS — E669 Obesity, unspecified: Secondary | ICD-10-CM | POA: Diagnosis not present

## 2023-01-13 DIAGNOSIS — Z1231 Encounter for screening mammogram for malignant neoplasm of breast: Secondary | ICD-10-CM

## 2023-01-13 DIAGNOSIS — Z1329 Encounter for screening for other suspected endocrine disorder: Secondary | ICD-10-CM

## 2023-01-13 NOTE — Assessment & Plan Note (Signed)
Controlled, no change in medication Oral meds only

## 2023-01-13 NOTE — Patient Instructions (Addendum)
Annual exam in 6 months, call if you need me sooner  You may take zyrtec twice daily if allergies are uncontrolled   Labs today CBC, lipid, cmp and EGFr, TSH and Vit D today   Mammogram to be schedule at checkout   It is important that you exercise regularly at least 30 minutes 5 times a week. If you develop chest pain, have severe difficulty breathing, or feel very tired, stop exercising immediately and seek medical attention    Thanks for choosing [facility], we consider it a privelige to serve you.

## 2023-01-13 NOTE — Progress Notes (Signed)
   Stefanie Taylor     MRN: [id number]      DOB: 21-Dec-1955  Chief Complaint  Patient presents with   Chronic Care Management    6 month f/u.     HPI Stefanie Taylor is here for follow up and re-evaluation of chronic medical conditions, medication management and review of any available recent lab and radiology data.  Preventive health is updated, specifically  Cancer screening and Immunization.   Has had 12 weeks of Pt for chronic back pain and is doing better as far as that is concerned Has to resume exercise and work on diet for 10 pound weight loss goal in next 6 months The PT denies any adverse reactions to current medications since the last visit.  There are no new concerns.  There are no specific complaints   ROS Denies recent fever or chills. Denies sinus pressure, nasal congestion, ear pain or sore throat. Denies chest congestion, productive cough or wheezing. Denies chest pains, palpitations and leg swelling Denies abdominal pain, nausea, vomiting,diarrhea or constipation.   Denies dysuria, frequency, hesitancy or incontinence. Denies joint pain, swelling and limitation in mobility. Denies headaches, seizures, numbness, or tingling. Denies depression, anxiety or insomnia. Denies skin break down or rash.   PE  BP 128/72   Pulse 68   Ht 5\' 5"  (1.651 m)   Wt 224 lb 0.6 oz (101.6 kg)   SpO2 97%   BMI 37.28 kg/m   Patient alert and oriented and in no cardiopulmonary distress.  HEENT: No facial asymmetry, EOMI,     Neck supple .  Chest: Clear to auscultation bilaterally.  CVS: S1, S2 no murmurs, no S3.Regular rate.  ABD: Soft non tender.   Ext: No edema  MS: Adequate ROM spine, shoulders, hips and knees.  Skin: Intact, no ulcerations or rash noted.  Psych: Good eye contact, normal affect. Memory intact not anxious or depressed appearing.  CNS: CN 2-12 intact, power,  normal throughout.no focal deficits noted.   Assessment & Plan  Low back pain  with left-sided sciatica Improved with PT, no plan on surgery takes gabapentin 100 mg at bedtime would has been evaluated by neurosurgery  Allergic rhinitis Controlled, no change in medication Oral meds only  Obesity (BMI 30-39.9) Unchanged  Patient re-educated about  the importance of commitment to a  minimum of 150 minutes of exercise per week as able.  The importance of healthy food choices with portion control discussed, as well as eating regularly and within a 12 hour window most days. The need to choose "clean , green" food 50 to 75% of the time is discussed, as well as to make water the primary drink and set a goal of 64 ounces water daily.       01/13/2023    8:12 AM 01/11/2023    8:34 AM 10/18/2022    8:11 AM  Weight /BMI  Weight 224 lb 0.6 oz 224 lb 237 lb 12.8 oz  Height 5\' 5"  (1.651 m) 5\' 5"  (1.651 m) 5\' 5"  (1.651 m)  BMI 37.28 kg/m2 37.28 kg/m2 39.57 kg/m2    10 pound weight loss target for 6 monhts

## 2023-01-13 NOTE — Assessment & Plan Note (Signed)
Improved with PT, no plan on surgery takes gabapentin 100 mg at bedtime would has been evaluated by neurosurgery

## 2023-01-13 NOTE — Assessment & Plan Note (Signed)
Unchanged  Patient re-educated about  the importance of commitment to a  minimum of 150 minutes of exercise per week as able.  The importance of healthy food choices with portion control discussed, as well as eating regularly and within a 12 hour window most days. The need to choose "clean , green" food 50 to 75% of the time is discussed, as well as to make water the primary drink and set a goal of 64 ounces water daily.       01/13/2023    8:12 AM 01/11/2023    8:34 AM 10/18/2022    8:11 AM  Weight /BMI  Weight 224 lb 0.6 oz 224 lb 237 lb 12.8 oz  Height 5\' 5"  (1.651 m) 5\' 5"  (1.651 m) 5\' 5"  (1.651 m)  BMI 37.28 kg/m2 37.28 kg/m2 39.57 kg/m2    10 pound weight loss target for 6 monhts

## 2023-01-14 DIAGNOSIS — H524 Presbyopia: Secondary | ICD-10-CM | POA: Diagnosis not present

## 2023-01-14 DIAGNOSIS — H35033 Hypertensive retinopathy, bilateral: Secondary | ICD-10-CM | POA: Diagnosis not present

## 2023-01-14 DIAGNOSIS — H04123 Dry eye syndrome of bilateral lacrimal glands: Secondary | ICD-10-CM | POA: Diagnosis not present

## 2023-01-14 DIAGNOSIS — H2513 Age-related nuclear cataract, bilateral: Secondary | ICD-10-CM | POA: Diagnosis not present

## 2023-01-14 LAB — CBC WITH DIFFERENTIAL/PLATELET
Basophils Absolute: 0 10*3/uL (ref 0.0–0.2)
Basos: 1 %
EOS (ABSOLUTE): 0.2 10*3/uL (ref 0.0–0.4)
Eos: 5 %
Hematocrit: 38.1 % (ref 34.0–46.6)
Hemoglobin: 12.7 g/dL (ref 11.1–15.9)
Immature Grans (Abs): 0 10*3/uL (ref 0.0–0.1)
Immature Granulocytes: 0 %
Lymphocytes Absolute: 1.7 10*3/uL (ref 0.7–3.1)
Lymphs: 38 %
MCH: 30.4 pg (ref 26.6–33.0)
MCHC: 33.3 g/dL (ref 31.5–35.7)
MCV: 91 fL (ref 79–97)
Monocytes Absolute: 0.5 10*3/uL (ref 0.1–0.9)
Monocytes: 12 %
Neutrophils Absolute: 2 10*3/uL (ref 1.4–7.0)
Neutrophils: 44 %
Platelets: 199 10*3/uL (ref 150–450)
RBC: 4.18 x10E6/uL (ref 3.77–5.28)
RDW: 12.9 % (ref 11.7–15.4)
WBC: 4.5 10*3/uL (ref 3.4–10.8)

## 2023-01-14 LAB — CMP14+EGFR
ALT: 31 IU/L (ref 0–32)
AST: 27 IU/L (ref 0–40)
Albumin/Globulin Ratio: 1.9 (ref 1.2–2.2)
Albumin: 3.8 g/dL — ABNORMAL LOW (ref 3.9–4.9)
Alkaline Phosphatase: 108 IU/L (ref 44–121)
BUN/Creatinine Ratio: 10 — ABNORMAL LOW (ref 12–28)
BUN: 7 mg/dL — ABNORMAL LOW (ref 8–27)
Bilirubin Total: 0.8 mg/dL (ref 0.0–1.2)
CO2: 23 mmol/L (ref 20–29)
Calcium: 9.2 mg/dL (ref 8.7–10.3)
Chloride: 106 mmol/L (ref 96–106)
Creatinine, Ser: 0.72 mg/dL (ref 0.57–1.00)
Globulin, Total: 2 g/dL (ref 1.5–4.5)
Glucose: 89 mg/dL (ref 70–99)
Potassium: 4.4 mmol/L (ref 3.5–5.2)
Sodium: 143 mmol/L (ref 134–144)
Total Protein: 5.8 g/dL — ABNORMAL LOW (ref 6.0–8.5)
eGFR: 91 mL/min/{1.73_m2} (ref >59)

## 2023-01-14 LAB — VITAMIN D 25 HYDROXY (VIT D DEFICIENCY, FRACTURES): Vit D, 25-Hydroxy: 37.6 ng/mL (ref 30.0–100.0)

## 2023-01-14 LAB — LIPID PANEL
Chol/HDL Ratio: 2.1 ratio (ref 0.0–4.4)
Cholesterol, Total: 142 mg/dL (ref 100–199)
HDL: 67 mg/dL (ref >39)
LDL Chol Calc (NIH): 66 mg/dL (ref 0–99)
Triglycerides: 38 mg/dL (ref 0–149)
VLDL Cholesterol Cal: 9 mg/dL (ref 5–40)

## 2023-01-14 LAB — TSH: TSH: 1.97 u[IU]/mL (ref 0.450–4.500)

## 2023-03-07 ENCOUNTER — Emergency Department (HOSPITAL_COMMUNITY): Payer: Medicare PPO

## 2023-03-07 ENCOUNTER — Emergency Department (HOSPITAL_COMMUNITY)
Admission: EM | Admit: 2023-03-07 | Discharge: 2023-03-07 | Disposition: A | Discharge status: Home / Self Care | Payer: Medicare PPO | Attending: Emergency Medicine | Admitting: Emergency Medicine

## 2023-03-07 ENCOUNTER — Encounter (HOSPITAL_COMMUNITY): Payer: Self-pay

## 2023-03-07 ENCOUNTER — Other Ambulatory Visit: Payer: Self-pay

## 2023-03-07 DIAGNOSIS — I517 Cardiomegaly: Secondary | ICD-10-CM | POA: Diagnosis not present

## 2023-03-07 DIAGNOSIS — R42 Dizziness and giddiness: Secondary | ICD-10-CM | POA: Insufficient documentation

## 2023-03-07 DIAGNOSIS — Z91040 Latex allergy status: Secondary | ICD-10-CM | POA: Diagnosis not present

## 2023-03-07 DIAGNOSIS — R531 Weakness: Secondary | ICD-10-CM | POA: Diagnosis not present

## 2023-03-07 DIAGNOSIS — I6521 Occlusion and stenosis of right carotid artery: Secondary | ICD-10-CM | POA: Diagnosis not present

## 2023-03-07 LAB — CBC WITH DIFFERENTIAL/PLATELET
Abs Immature Granulocytes: 0.01 10*3/uL (ref 0.00–0.07)
Basophils Absolute: 0 10*3/uL (ref 0.0–0.1)
Basophils Relative: 1 %
Eosinophils Absolute: 0.1 10*3/uL (ref 0.0–0.5)
Eosinophils Relative: 3 %
HCT: 37.4 % (ref 36.0–46.0)
Hemoglobin: 12.3 g/dL (ref 12.0–15.0)
Immature Granulocytes: 0 %
Lymphocytes Relative: 41 %
Lymphs Abs: 1.4 10*3/uL (ref 0.7–4.0)
MCH: 29.7 pg (ref 26.0–34.0)
MCHC: 32.9 g/dL (ref 30.0–36.0)
MCV: 90.3 fL (ref 80.0–100.0)
Monocytes Absolute: 0.3 10*3/uL (ref 0.1–1.0)
Monocytes Relative: 9 %
Neutro Abs: 1.6 10*3/uL — ABNORMAL LOW (ref 1.7–7.7)
Neutrophils Relative %: 46 %
Platelets: 156 10*3/uL (ref 150–400)
RBC: 4.14 MIL/uL (ref 3.87–5.11)
RDW: 12.4 % (ref 11.5–15.5)
WBC: 3.5 10*3/uL — ABNORMAL LOW (ref 4.0–10.5)
nRBC: 0 % (ref 0.0–0.2)

## 2023-03-07 LAB — COMPREHENSIVE METABOLIC PANEL
ALT: 27 U/L (ref 0–44)
AST: 30 U/L (ref 15–41)
Albumin: 3.4 g/dL — ABNORMAL LOW (ref 3.5–5.0)
Alkaline Phosphatase: 80 U/L (ref 38–126)
Anion gap: 6 (ref 5–15)
BUN: 13 mg/dL (ref 8–23)
CO2: 26 mmol/L (ref 22–32)
Calcium: 8.7 mg/dL — ABNORMAL LOW (ref 8.9–10.3)
Chloride: 108 mmol/L (ref 98–111)
Creatinine, Ser: 0.59 mg/dL (ref 0.44–1.00)
GFR, Estimated: >60 mL/min (ref >60)
Glucose, Bld: 96 mg/dL (ref 70–99)
Potassium: 3.7 mmol/L (ref 3.5–5.1)
Sodium: 140 mmol/L (ref 135–145)
Total Bilirubin: 0.9 mg/dL (ref 0.3–1.2)
Total Protein: 6 g/dL — ABNORMAL LOW (ref 6.5–8.1)

## 2023-03-07 LAB — URINALYSIS, W/ REFLEX TO CULTURE (INFECTION SUSPECTED)
Bacteria, UA: NONE SEEN
Bilirubin Urine: NEGATIVE
Glucose, UA: NEGATIVE mg/dL
Hgb urine dipstick: NEGATIVE
Ketones, ur: NEGATIVE mg/dL
Leukocytes,Ua: NEGATIVE
Nitrite: NEGATIVE
Protein, ur: NEGATIVE mg/dL
Specific Gravity, Urine: 1.024 (ref 1.005–1.030)
pH: 5 (ref 5.0–8.0)

## 2023-03-07 LAB — TROPONIN I (HIGH SENSITIVITY)
Troponin I (High Sensitivity): 3 ng/L (ref <18)
Troponin I (High Sensitivity): 3 ng/L (ref <18)

## 2023-03-07 LAB — CBG MONITORING, ED: Glucose-Capillary: 100 mg/dL — ABNORMAL HIGH (ref 70–99)

## 2023-03-07 MED ORDER — ONDANSETRON HCL 4 MG PO TABS: 4.0000 mg | ORAL_TABLET | Freq: Four times a day (QID) | ORAL | 0 refills | Status: DC

## 2023-03-07 MED ORDER — IOHEXOL 350 MG/ML SOLN
75.0000 mL | Freq: Once | INTRAVENOUS | Status: AC | PRN
  Administered 2023-03-07: 75 mL via INTRAVENOUS

## 2023-03-07 MED ORDER — SODIUM CHLORIDE (PF) 0.9 % IJ SOLN
INTRAMUSCULAR | Status: AC
  Filled 2023-03-07: qty 50

## 2023-03-07 MED ORDER — ONDANSETRON HCL 4 MG/2ML IJ SOLN
4.0000 mg | Freq: Once | INTRAMUSCULAR | Status: AC
  Administered 2023-03-07: 4 mg via INTRAVENOUS
  Filled 2023-03-07: qty 2

## 2023-03-07 MED ORDER — SODIUM CHLORIDE 0.9 % IV BOLUS
500.0000 mL | Freq: Once | INTRAVENOUS | Status: AC
  Administered 2023-03-07: 500 mL via INTRAVENOUS

## 2023-03-07 NOTE — ED Triage Notes (Addendum)
Patient reports dizziness x3 days. Patient denies chest pain and SOB. Patient has hx of low HR.  HR 30s-60s on arrival.

## 2023-03-07 NOTE — ED Provider Notes (Signed)
[place] EMERGENCY DEPARTMENT AT [facility] Provider Note   CSN: [id number] Arrival date & time: 03/07/23  0102     History  Chief Complaint  Patient presents with   Dizziness    Stefanie Taylor is a 67 y.o. female.  67 year old female with prior medical history as detailed below presents for evaluation.  Patient reports generalized weakness and unsteady gait.  Patient with onset of symptoms Saturday afternoon 2 days prior to evaluation.  Patient reports that she was at a funeral and then had to leave early because she had significant diarrheal stool for the next hour.  After having multiple episodes of loose bowel movements Saturday afternoon she had persistent feelings of weakness and unsteady gait.  She reports that with standing she feels somewhat unsteady and that with walking she feels like she needs to hold onto objects in order to maintain her upright position.  She denies vertiginous symptoms at rest.  She denies headache.  She denies vision change.  She denies speech change.  She denies focal weakness.  She reports that with standing she feels "weak all over" and has occasional nausea.  She denies associated fever, chest pain, shortness of breath, abdominal pain, continued diarrhea.  She reports that with onset of symptoms Saturday afternoon she took avoided strenuous activities on Sunday.  She reports that she has had minimal p.o. intake over the course of the last 24 hours.  The history is provided by the patient and medical records.       Home Medications Prior to Admission medications   Medication Sig Start Date End Date Taking? Authorizing Provider  cetirizine (ZYRTEC) 10 MG tablet TAKE 1 TABLET(10 MG) BY MOUTH DAILY 08/10/22   Kerri Perches, MD  gabapentin (NEURONTIN) 100 MG capsule Take 100 mg by mouth daily at 6 (six) AM. 09/28/22   [provider]  hydrocortisone 2.5 % ointment Apply topically 2 (two) times daily. 08/20/21    Alfonse Spruce, MD  montelukast (SINGULAIR) 10 MG tablet TAKE 1 TABLET(10 MG) BY MOUTH AT BEDTIME 10/29/22   Kerri Perches, MD  Multiple Vitamin (MULTIVITAMIN ADULT PO)     [provider]  triamcinolone 0.1% oint-Eucerin equivalent cream 1:1 mixture Use 1 application twice a day as needed to red itchy areas. Do not use on face, neck, groin, or armpit region 10/24/20   Nehemiah Settle, FNP  triamcinolone ointment (KENALOG) 0.1 % Apply 1 application topically 2 (two) times daily as needed. 02/11/21   Alfonse Spruce, MD      Allergies    Elemental sulfur, Lasix [furosemide], Sulfamethoxazole, Ibuprofen, Latex, and Penicillins    Review of Systems   Review of Systems  All other systems reviewed and are negative.   Physical Exam Updated Vital Signs BP 127/88   Pulse (!) 50   Temp 98.3 F (36.8 C) (Oral)   Resp 18   Ht 5\' 5"  (1.651 m)   Wt 101.6 kg   SpO2 100%   BMI 37.27 kg/m  Physical Exam Vitals and nursing note reviewed.  Constitutional:      General: She is not in acute distress.    Appearance: Normal appearance. She is well-developed.  HENT:     Head: Normocephalic and atraumatic.  Eyes:     Conjunctiva/sclera: Conjunctivae normal.     Pupils: Pupils are equal, round, and reactive to light.  Cardiovascular:     Rate and Rhythm: Normal rate and regular rhythm.     Heart  sounds: Normal heart sounds.  Pulmonary:     Effort: Pulmonary effort is normal. No respiratory distress.     Breath sounds: Normal breath sounds.  Abdominal:     General: There is no distension.     Palpations: Abdomen is soft.     Tenderness: There is no abdominal tenderness.  Musculoskeletal:        General: No deformity. Normal range of motion.     Cervical back: Normal range of motion and neck supple.  Skin:    General: Skin is warm and dry.  Neurological:     General: No focal deficit present.     Mental Status: She is alert and oriented to person, place, and time.  Mental status is at baseline.     Cranial Nerves: No cranial nerve deficit.     Sensory: No sensory deficit.     Motor: No weakness.     Coordination: Coordination normal.     Comments: Alert, oriented x 4  Normal speech  No facial droop  5 out of 5 strength in both upper and lower extremities  No truncal ataxia with sitting, with standing patient is stable  - however, she reports that she feels like she would prefer to hold onto objects if ambulating.     ED Results / Procedures / Treatments   Labs (all labs ordered are listed, but only abnormal results are displayed) Labs Reviewed  CBG MONITORING, ED - Abnormal; Notable for the following components:      Result Value   Glucose-Capillary 100 (*)    All other components within normal limits  CBC WITH DIFFERENTIAL/PLATELET  COMPREHENSIVE METABOLIC PANEL  URINALYSIS, W/ REFLEX TO CULTURE (INFECTION SUSPECTED)  TROPONIN I (HIGH SENSITIVITY)    EKG EKG Interpretation  Date/Time:  Monday March 07 2023 09:41:56 EDT Ventricular Rate:  71 PR Interval:  159 QRS Duration: 90 QT Interval:  454 QTC Calculation: 389 R Axis:   52 Text Interpretation: Sinus rhythm Multiple premature complexes, vent & supraven Confirmed by Kristine Royal [phone]) on 03/07/2023 9:46:42 AM  Radiology No results found.  Procedures Procedures    Medications Ordered in ED Medications  sodium chloride 0.9 % bolus 500 mL (500 mLs Intravenous New Bag/Given 03/07/23 1001)  ondansetron (ZOFRAN) injection 4 mg (4 mg Intravenous Given 03/07/23 1000)    ED Course/ Medical Decision Making/ A&P Clinical Course as of 03/07/23 1003  Mon Mar 07, 2023  0934 BP(!): 180/134 Initial blood pressures taken with inappropriately sized cuff (too small) on forearm.  With appropriate cuff on the upper arm BP is within normal limits. [PM]    Clinical Course User Index [PM] Wynetta Fines, MD                             Medical Decision Making Amount and/or  Complexity of Data Reviewed Labs: ordered. Radiology: ordered.  Risk Prescription drug management.    Medical Screen Complete  This patient presented to the ED with complaint of weakness, unsteady gait.  This complaint involves an extensive number of treatment options. The initial differential diagnosis includes, but is not limited to, viral illness, metabolic abnormality, intracranial process, etc.  This presentation is: Acute, Chronic, Self-Limited, Previously Undiagnosed, Uncertain Prognosis, Complicated, Systemic Symptoms, and Threat to Life/Bodily Function  Patient presents with complaint of diarrhea, generalized weakness, mild nausea, and unsteady gait.  Symptoms began approximately 48 hours prior to presentation.  Patient is neurologically intact.  Screening labs obtained are without significant abnormality.  Additionally, CT imaging of the brain is without evidence of acute stroke or dissection or other acute pathology.  After ED evaluation administration of antiemetics and minimal volume of IV fluids patient feels significantly improved.  She is able to ambulate and is steady gait.  She desires discharge home.  She is advised close outpatient follow-up is important.  Strict return precautions given and understood.  Additional history obtained: External records from outside sources obtained and reviewed including prior ED visits and prior Inpatient records.    Lab Tests:  I ordered and personally interpreted labs.    Imaging Studies ordered:  I ordered imaging studies including chest x-ray, CT angio head and neck I independently visualized and interpreted obtained imaging which showed NAD I agree with the radiologist interpretation.   Cardiac Monitoring:  The patient was maintained on a cardiac monitor.  I personally viewed and interpreted the cardiac monitor which showed an underlying rhythm of: NSR   Medicines ordered:  I ordered medication including IV  fluids, antiemetics for suspected dehydration Reevaluation of the patient after these medicines showed that the patient: improved   Problem List / ED Course:  Weakness   Reevaluation:  After the interventions noted above, I reevaluated the patient and found that they have: improved   Disposition:  After consideration of the diagnostic results and the patients response to treatment, I feel that the patent would benefit from close outpatient follow-up.          Final Clinical Impression(s) / ED Diagnoses Final diagnoses:  Dizziness    Rx / DC Orders ED Discharge Orders     None         Wynetta Fines, MD 03/07/23 [phone]

## 2023-03-07 NOTE — Discharge Instructions (Signed)
Return for any problem.  ?

## 2023-03-09 ENCOUNTER — Encounter: Payer: Self-pay | Admitting: Family Medicine

## 2023-03-15 ENCOUNTER — Telehealth: Payer: Self-pay

## 2023-03-15 NOTE — Telephone Encounter (Signed)
Transition Care Management Follow-up Telephone Call Date of discharge and from where: [facility] 6/24 How have you been since you were released from the hospital? Doing well  Any questions or concerns? No  Items Reviewed: Did the pt receive and understand the discharge instructions provided? Yes  Medications obtained and verified? Yes  Other? No  Any new allergies since your discharge? No  Dietary orders reviewed? No Do you have support at home? Yes   Follow up appointments reviewed:  PCP Hospital f/u appt confirmed? Yes  Scheduled to see PCP 7/10 on  @ . Specialist Hospital f/u appt confirmed? No  Scheduled to see  on  @ . Are transportation arrangements needed? No  If their condition worsens, is the pt aware to call PCP or go to the Emergency Dept.? Yes Was the patient provided with contact information for the PCP's office or ED? Yes Was to pt encouraged to call back with questions or concerns? Yes

## 2023-03-23 ENCOUNTER — Ambulatory Visit: Payer: Medicare PPO | Admitting: Family Medicine

## 2023-03-23 ENCOUNTER — Encounter: Payer: Self-pay | Admitting: Family Medicine

## 2023-03-23 VITALS — BP 133/79 | HR 59 | Ht 65.0 in | Wt 224.0 lb

## 2023-03-23 DIAGNOSIS — Z09 Encounter for follow-up examination after completed treatment for conditions other than malignant neoplasm: Secondary | ICD-10-CM

## 2023-03-23 DIAGNOSIS — Z01818 Encounter for other preprocedural examination: Secondary | ICD-10-CM

## 2023-03-23 NOTE — Progress Notes (Signed)
   Stefanie Taylor     MRN: [id number]      DOB: 11-May-1956  Chief Complaint  Patient presents with   Follow-up    Follow up  Scheduled as f/u from ED visit on 6/24 when she presented with dizziness following explosive diarrhea 2 days prior to ging to the ED. EKG, head imaging normal, states mild light headedness when she raises her head quickly, otherwise back to normal. ED visit labs and imaging are reviewed with the patient Requests at visit pre op clearance for rigth knee replacement Had cardiology evl in 12/2022 where she was cleared from a cardiac standpoint for general anaesthesia, at which time spine surgery was being considered  HPI See CC    ED visit reviewed, back to 100 % Saw Ortho 12/14/2022, injections done in both knees, bilateral knee replacement is recommended, waiting on clearance to have right knee done first. left  sciatic newly started on gabapentin in March, takes on avg 3 to 4 timers / month , surgery recommended, holding off on spine surgery, and pursuing knee replacement ROS Denies recent fever or chills. Denies sinus pressure, nasal congestion, ear pain or sore throat. Denies chest congestion, productive cough or wheezing. Denies chest pains, palpitations and leg swelling Denies abdominal pain, nausea, vomiting,diarrhea or constipation.   Denies dysuria, frequency, hesitancy or incontinence. Bilateral knee pain, rated at 10 , ready for replacement, left worse than rigtht, also has sciatica. Denies headaches, seizures, numbness, or tingling. Denies depression, anxiety or insomnia. Denies skin break down or rash.   PE  BP 133/79 (BP Location: Left Arm, Patient Position: Sitting, Cuff Size: Large)   Pulse (!) 59   Ht 5\' 5"  (1.651 m)   Wt 224 lb 0.6 oz (101.6 kg)   SpO2 97%   BMI 37.28 kg/m   Patient alert and oriented and in no cardiopulmonary distress.  HEENT: No facial asymmetry, EOMI,     Neck supple .  Chest: Clear to auscultation  bilaterally.  CVS: S1, S2 no murmurs, no S3.Regular rate.  ABD: Soft non tender.   Ext: No edema  MS: decreased  ROM spine,  and knees.  Skin: Intact, no ulcerations or rash noted.  Psych: Good eye contact, normal affect. Memory intact not anxious or depressed appearing.  CNS: CN 2-12 intact, power,  normal throughout.no focal deficits noted.   Assessment & Plan  Encounter for examination following treatment at hospital Patient in for follow up of recent ED visit Discharge summary, and laboratory and radiology data are reviewed, and any questions or concerns  are discussed. Specific issues requiring follow up are specifically addressed.   Pre-operative clearance History and exam as documented Recent radiologic nd lab data and EKG in eD reviwed Rept CBC and chem 7 ordered Cleared in 12/2022 by cardiology for GA , no need for cardiology re eval Medically cleared for right knee surgery and form faxed to Ortho

## 2023-03-23 NOTE — Patient Instructions (Addendum)
F/U  as before, call if you need me sooner  CBC and diff today, chem 7 and EGFr  Re ed viosit, no additional referrals or testing planned at this time. IF Vertigo as in spinning recurrs pls contact me , I will refer you to eNT    I will message Cardiology re upcoming surgery with review of recent Ed visit to see if Cardiology clearance required from their standpoint ( think not, but..)   Medical,clearance is complete, however awaiting note from Cardiology  Thanks for choosing [facility], we consider it a privelige to serve you.

## 2023-03-24 LAB — CBC WITH DIFFERENTIAL/PLATELET
Basophils Absolute: 0.1 10*3/uL (ref 0.0–0.2)
Basos: 2 %
EOS (ABSOLUTE): 0.1 10*3/uL (ref 0.0–0.4)
Eos: 3 %
Hematocrit: 39.1 % (ref 34.0–46.6)
Hemoglobin: 13 g/dL (ref 11.1–15.9)
Immature Grans (Abs): 0 10*3/uL (ref 0.0–0.1)
Immature Granulocytes: 0 %
Lymphocytes Absolute: 1.6 10*3/uL (ref 0.7–3.1)
Lymphs: 50 %
MCH: 30 pg (ref 26.6–33.0)
MCHC: 33.2 g/dL (ref 31.5–35.7)
MCV: 90 fL (ref 79–97)
Monocytes Absolute: 0.4 10*3/uL (ref 0.1–0.9)
Monocytes: 11 %
Neutrophils Absolute: 1.1 10*3/uL — ABNORMAL LOW (ref 1.4–7.0)
Neutrophils: 34 %
Platelets: 178 10*3/uL (ref 150–450)
RBC: 4.34 x10E6/uL (ref 3.77–5.28)
RDW: 12.1 % (ref 11.7–15.4)
WBC: 3.3 10*3/uL — ABNORMAL LOW (ref 3.4–10.8)

## 2023-03-24 LAB — BMP8+EGFR
BUN/Creatinine Ratio: 16 (ref 12–28)
BUN: 13 mg/dL (ref 8–27)
CO2: 24 mmol/L (ref 20–29)
Calcium: 9.6 mg/dL (ref 8.7–10.3)
Chloride: 102 mmol/L (ref 96–106)
Creatinine, Ser: 0.79 mg/dL (ref 0.57–1.00)
Glucose: 87 mg/dL (ref 70–99)
Potassium: 4.5 mmol/L (ref 3.5–5.2)
Sodium: 141 mmol/L (ref 134–144)
eGFR: 81 mL/min/{1.73_m2} (ref >59)

## 2023-03-25 ENCOUNTER — Encounter: Payer: Self-pay | Admitting: Family Medicine

## 2023-03-25 DIAGNOSIS — Z09 Encounter for follow-up examination after completed treatment for conditions other than malignant neoplasm: Secondary | ICD-10-CM | POA: Insufficient documentation

## 2023-03-25 DIAGNOSIS — Z01818 Encounter for other preprocedural examination: Secondary | ICD-10-CM | POA: Insufficient documentation

## 2023-03-25 NOTE — Assessment & Plan Note (Signed)
History and exam as documented Recent radiologic nd lab data and EKG in eD reviwed Rept CBC and chem 7 ordered Cleared in 12/2022 by cardiology for GA , no need for cardiology re eval Medically cleared for right knee surgery and form faxed to Ortho

## 2023-03-25 NOTE — Assessment & Plan Note (Addendum)
Patient in for follow up of recent ED visit ?Discharge summary, and laboratory and radiology data are reviewed, and any questions or concerns  are discussed. ?Specific issues requiring follow up are specifically addressed. ? ?

## 2023-04-04 ENCOUNTER — Telehealth: Payer: Self-pay | Admitting: *Deleted

## 2023-04-04 NOTE — Telephone Encounter (Signed)
   Pre-operative Risk Assessment    Patient Name: Stefanie Taylor  DOB: 1956-04-15 MRN: [id number]      Request for Surgical Clearance    Procedure:   LEFT TKA  Date of Surgery:  Clearance TBD                                 Surgeon:  DR. Georgena Spurling Surgeon's Group or Practice Name:  [facility]; SPORTS MEDICINE & JOINT REPLACEMENT  Phone number:  [phone] Fax number:  [phone]   Type of Clearance Requested:   - Medical ; NO MEDICATIONS LISTED AS NEEDING TO BE HELD   Type of Anesthesia:  Spinal   Additional requests/questions:    Elpidio Anis   04/04/2023, 5:16 PM

## 2023-04-04 NOTE — Telephone Encounter (Signed)
Paper Work Dropped Off: PT needs clearance   Date: 04/04/2023  Location of paper: [facility]

## 2023-04-05 ENCOUNTER — Telehealth: Payer: Self-pay | Admitting: *Deleted

## 2023-04-05 NOTE — Telephone Encounter (Signed)
   Name: Stefanie Taylor  DOB: 16-Nov-1955  MRN: [id number]  Primary Cardiologist: Charlton Haws, MD   Preoperative team, please contact this patient and set up a phone call appointment for further preoperative risk assessment. Please obtain consent and complete medication review. Thank you for your help.  I confirm that guidance regarding antiplatelet and oral anticoagulation therapy has been completed and, if necessary, noted below (none requested).    Joylene Grapes, NP 04/05/2023, 8:24 AM [facility]

## 2023-04-05 NOTE — Telephone Encounter (Signed)
I s/w the pt and she has been scheduled for tele pre op appt 04/13/23 @ 1:40. Med rec and consent are done.

## 2023-04-05 NOTE — Telephone Encounter (Signed)
I s/w the pt and she has been scheduled for tele pre op appt 04/13/23 @ 1:40. Med rec and consent are done.      Patient Consent for Virtual Visit        Stefanie Taylor has provided verbal consent on 04/05/2023 for a virtual visit (video or telephone).   CONSENT FOR VIRTUAL VISIT FOR:  Stefanie Taylor  By participating in this virtual visit I agree to the following:  I hereby voluntarily request, consent and authorize [facility] and its employed or contracted physicians, physician assistants, nurse practitioners or other licensed health care professionals (the Practitioner), to provide me with telemedicine health care services (the "Services") as deemed necessary by the treating Practitioner. I acknowledge and consent to receive the Services by the Practitioner via telemedicine. I understand that the telemedicine visit will involve communicating with the Practitioner through live audiovisual communication technology and the disclosure of certain medical information by electronic transmission. I acknowledge that I have been given the opportunity to request an in-person assessment or other available alternative prior to the telemedicine visit and am voluntarily participating in the telemedicine visit.  I understand that I have the right to withhold or withdraw my consent to the use of telemedicine in the course of my care at any time, without affecting my right to future care or treatment, and that the Practitioner or I may terminate the telemedicine visit at any time. I understand that I have the right to inspect all information obtained and/or recorded in the course of the telemedicine visit and may receive copies of available information for a reasonable fee.  I understand that some of the potential risks of receiving the Services via telemedicine include:  Delay or interruption in medical evaluation due to technological equipment failure or disruption; Information transmitted  may not be sufficient (e.g. poor resolution of images) to allow for appropriate medical decision making by the Practitioner; and/or  In rare instances, security protocols could fail, causing a breach of personal health information.  Furthermore, I acknowledge that it is my responsibility to provide information about my medical history, conditions and care that is complete and accurate to the best of my ability. I acknowledge that Practitioner's advice, recommendations, and/or decision may be based on factors not within their control, such as incomplete or inaccurate data provided by me or distortions of diagnostic images or specimens that may result from electronic transmissions. I understand that the practice of medicine is not an exact science and that Practitioner makes no warranties or guarantees regarding treatment outcomes. I acknowledge that a copy of this consent can be made available to me via my patient portal [facility] MyChart), or I can request a printed copy by calling the office of [facility].    I understand that my insurance will be billed for this visit.   I have read or had this consent read to me. I understand the contents of this consent, which adequately explains the benefits and risks of the Services being provided via telemedicine.  I have been provided ample opportunity to ask questions regarding this consent and the Services and have had my questions answered to my satisfaction. I give my informed consent for the services to be provided through the use of telemedicine in my medical care

## 2023-04-13 ENCOUNTER — Ambulatory Visit: Payer: Medicare PPO | Attending: Internal Medicine

## 2023-04-13 DIAGNOSIS — Z01810 Encounter for preprocedural cardiovascular examination: Secondary | ICD-10-CM

## 2023-04-13 NOTE — Progress Notes (Signed)
Virtual Visit via Telephone Note   Because of Stefanie Taylor's co-morbid illnesses, she is at least at moderate risk for complications without adequate follow up.  This format is felt to be most appropriate for this patient at this time.  The patient did not have access to video technology/had technical difficulties with video requiring transitioning to audio format only (telephone).  All issues noted in this document were discussed and addressed.  No physical exam could be performed with this format.  Please refer to the patient's chart for her consent to telehealth for [facility]  Evaluation Performed:  Preoperative cardiovascular risk assessment _____________   Date:  04/13/2023   Patient ID:  Stefanie Taylor, Stefanie Taylor [birth year], MRN [id number] Patient Location:  Home Provider location:   Office  Primary Care Provider:  Kerri Perches, MD Primary Cardiologist:  Charlton Haws, MD  Chief Complaint / Patient Profile   67 y.o. y/o female with a h/o bradycardia, gastric bypass, HTN, sickle cell trait who is pending left total knee arthroplasty and presents today for telephonic preoperative cardiovascular risk assessment.  History of Present Illness    Stefanie Taylor is a 67 y.o. female who presents via audio/video conferencing for a telehealth visit today.  Pt was last seen in cardiology clinic on 01/11/2023 by Dr. Eden Emms.  At that time Stefanie Taylor was doing well with no new cardiac complaints.  She was working out at [employer] and using cycles in the pool. The patient is now pending procedure as outlined above. Since her last visit, she has been doing well with exception of ED visit for dizziness and GI upset.  She does report today that she is feeling much better and has not experienced any cardiac complaints or concerns since her previous visit.  She denies chest pain, shortness of breath, lower extremity edema, fatigue, palpitations, melena, hematuria,  hemoptysis, diaphoresis, weakness, presyncope, syncope, orthopnea, and PND.    Past Medical History    Past Medical History:  Diagnosis Date   Bradycardia    H/O sickle cell trait    Hypertension    Morbid obesity with BMI of 50.0-59.9, adult (HCC) 08/15/2012   BMI: 53.1 kg/m^2   Past Surgical History:  Procedure Laterality Date   BIOPSY BREAST     BREAST BIOPSY     BREAST EXCISIONAL BIOPSY Right    BREAST SURGERY  1998   biopsy mole on right breast   BREATH TEK H PYLORI  09/04/2012   Procedure: BREATH TEK H PYLORI;  Surgeon: Kandis Cocking, MD;  Location: [facility];  Service: General;  Laterality: N/A;   BREATH TEK H PYLORI N/A 11/06/2012   Procedure: [place];  Surgeon: Kandis Cocking, MD;  Location: [facility];  Service: General;  Laterality: N/A;   COLONOSCOPY WITH PROPOFOL N/A 05/28/2016   Procedure: COLONOSCOPY WITH PROPOFOL;  Surgeon: Jeani Hawking, MD;  Location: [facility];  Service: Endoscopy;  Laterality: N/A;   GASTRIC BYPASS  09/07/2017   TUBAL LIGATION     UMBILICAL HERNIA REPAIR     age 39    Allergies  Allergies  Allergen Reactions   Elemental Sulfur    Lasix [Furosemide] Itching and Swelling   Sulfamethoxazole Itching   Ibuprofen Itching and Rash   Latex Rash   Penicillins Rash    Home Medications    Prior to Admission medications   Medication Sig Start Date End Date Taking? Authorizing Provider  cetirizine (ZYRTEC) 10 MG  tablet TAKE 1 TABLET(10 MG) BY MOUTH DAILY 08/10/22   Kerri Perches, MD  gabapentin (NEURONTIN) 100 MG capsule Take 100 mg by mouth daily at 6 (six) AM. 09/28/22   [provider]  hydrocortisone 2.5 % ointment Apply topically 2 (two) times daily. 08/20/21   Alfonse Spruce, MD  montelukast (SINGULAIR) 10 MG tablet TAKE 1 TABLET(10 MG) BY MOUTH AT BEDTIME 10/29/22   Kerri Perches, MD  Multiple Vitamin (MULTIVITAMIN ADULT PO)     [provider]  triamcinolone 0.1% oint-Eucerin  equivalent cream 1:1 mixture Use 1 application twice a day as needed to red itchy areas. Do not use on face, neck, groin, or armpit region 10/24/20   Nehemiah Settle, FNP  triamcinolone ointment (KENALOG) 0.1 % Apply 1 application topically 2 (two) times daily as needed. 02/11/21   Alfonse Spruce, MD    Physical Exam    Vital Signs:  Stefanie Taylor does not have vital signs available for review today.  Given telephonic nature of communication, physical exam is limited. AAOx3. NAD. Normal affect.  Speech and respirations are unlabored.  Accessory Clinical Findings    None  Assessment & Plan    1.  Preoperative Cardiovascular Risk Assessment:  Patient is RCRI score 0.9%  The patient affirms she has been doing well without any new cardiac symptoms. They are able to achieve 7 METS without cardiac limitations. Therefore, based on ACC/AHA guidelines, the patient would be at acceptable risk for the planned procedure without further cardiovascular testing. The patient was advised that if she develops new symptoms prior to surgery to contact our office to arrange for a follow-up visit, and she verbalized understanding.   The patient was advised that if she develops new symptoms prior to surgery to contact our office to arrange for a follow-up visit, and she verbalized understanding.    A copy of this note will be routed to requesting surgeon.  Time:   Today, I have spent 6 minutes with the patient with telehealth technology discussing medical history, symptoms, and management plan.     Napoleon Form, Leodis Rains, NP  04/13/2023, 7:20 AM

## 2023-04-20 ENCOUNTER — Other Ambulatory Visit: Payer: Self-pay | Admitting: Family Medicine

## 2023-04-20 DIAGNOSIS — Z1231 Encounter for screening mammogram for malignant neoplasm of breast: Secondary | ICD-10-CM

## 2023-04-25 ENCOUNTER — Telehealth: Payer: Self-pay | Admitting: Cardiovascular Disease
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
## 2023-04-26 ENCOUNTER — Encounter: Payer: Self-pay | Admitting: Cardiovascular Disease

## 2023-04-26 NOTE — Telephone Encounter (Signed)
Left message to call back and let me know if she wants to pick up the clearance notes as she stated in her call today or I can send to [facility] CHART. I left message that Robin Searing, NP did fax the clearance notes on 04/13/23. I will also re-fax the notes to surgeon's office as well.

## 2023-04-26 NOTE — Telephone Encounter (Signed)
Pt called back and said she would like to come by the office to pick up the clearance notes. I assured the pt that I will place notes up at the front desk for her to pick up at her convenience. Pt said she will come by tomorrow morning 04/27/23. Pt thanked me for the help.
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
## 2023-05-20 ENCOUNTER — Telehealth: Payer: Self-pay | Admitting: Family Medicine

## 2023-05-20 NOTE — Telephone Encounter (Signed)
Faxed paperwork 

## 2023-05-20 NOTE — Telephone Encounter (Signed)
Stefanie Taylor with Dr Stefanie Taylor's office calling says they are missing OV notes and lab notes for her surgical clearance. Questions call [phone] fax # [phone] Thank you

## 2023-05-26 DIAGNOSIS — M1712 Unilateral primary osteoarthritis, left knee: Secondary | ICD-10-CM | POA: Diagnosis not present

## 2023-06-06 ENCOUNTER — Ambulatory Visit (HOSPITAL_COMMUNITY): Payer: Medicare PPO

## 2023-06-06 ENCOUNTER — Ambulatory Visit: Payer: Medicare PPO

## 2023-06-06 ENCOUNTER — Ambulatory Visit
Admission: RE | Admit: 2023-06-06 | Discharge: 2023-06-06 | Disposition: A | Discharge status: Home / Self Care | Payer: Medicare PPO | Source: Ambulatory Visit | Attending: Family Medicine | Admitting: Family Medicine

## 2023-06-06 DIAGNOSIS — Z1231 Encounter for screening mammogram for malignant neoplasm of breast: Secondary | ICD-10-CM

## 2023-06-08 ENCOUNTER — Other Ambulatory Visit: Payer: Self-pay | Admitting: Family Medicine

## 2023-06-08 DIAGNOSIS — Z1231 Encounter for screening mammogram for malignant neoplasm of breast: Secondary | ICD-10-CM

## 2023-06-09 ENCOUNTER — Ambulatory Visit
Admission: RE | Admit: 2023-06-09 | Discharge: 2023-06-09 | Disposition: A | Discharge status: Home / Self Care | Payer: Medicare PPO | Source: Ambulatory Visit | Attending: Family Medicine | Admitting: Family Medicine

## 2023-06-09 DIAGNOSIS — Z1231 Encounter for screening mammogram for malignant neoplasm of breast: Secondary | ICD-10-CM

## 2023-06-21 ENCOUNTER — Other Ambulatory Visit: Payer: Self-pay | Admitting: Family Medicine

## 2023-06-21 DIAGNOSIS — M25562 Pain in left knee: Secondary | ICD-10-CM | POA: Diagnosis not present

## 2023-06-21 DIAGNOSIS — Z124 Encounter for screening for malignant neoplasm of cervix: Secondary | ICD-10-CM | POA: Diagnosis not present

## 2023-06-21 DIAGNOSIS — Z01411 Encounter for gynecological examination (general) (routine) with abnormal findings: Secondary | ICD-10-CM | POA: Diagnosis not present

## 2023-06-21 DIAGNOSIS — M25561 Pain in right knee: Secondary | ICD-10-CM | POA: Diagnosis not present

## 2023-06-22 DIAGNOSIS — R6889 Other general symptoms and signs: Secondary | ICD-10-CM | POA: Diagnosis not present

## 2023-06-22 DIAGNOSIS — M170 Bilateral primary osteoarthritis of knee: Secondary | ICD-10-CM | POA: Diagnosis not present

## 2023-06-22 DIAGNOSIS — G8929 Other chronic pain: Secondary | ICD-10-CM | POA: Diagnosis not present

## 2023-06-22 DIAGNOSIS — M25562 Pain in left knee: Secondary | ICD-10-CM | POA: Diagnosis not present

## 2023-06-27 DIAGNOSIS — M25762 Osteophyte, left knee: Secondary | ICD-10-CM | POA: Diagnosis not present

## 2023-06-27 DIAGNOSIS — G8918 Other acute postprocedural pain: Secondary | ICD-10-CM | POA: Diagnosis not present

## 2023-06-27 DIAGNOSIS — Z96652 Presence of left artificial knee joint: Secondary | ICD-10-CM | POA: Diagnosis not present

## 2023-06-27 DIAGNOSIS — M1712 Unilateral primary osteoarthritis, left knee: Secondary | ICD-10-CM | POA: Diagnosis not present

## 2023-06-27 HISTORY — PX: OTHER SURGICAL HISTORY: SHX169

## 2023-07-07 DIAGNOSIS — M25462 Effusion, left knee: Secondary | ICD-10-CM | POA: Diagnosis not present

## 2023-07-07 DIAGNOSIS — M25662 Stiffness of left knee, not elsewhere classified: Secondary | ICD-10-CM | POA: Diagnosis not present

## 2023-07-07 DIAGNOSIS — R29898 Other symptoms and signs involving the musculoskeletal system: Secondary | ICD-10-CM | POA: Diagnosis not present

## 2023-07-07 DIAGNOSIS — Z96652 Presence of left artificial knee joint: Secondary | ICD-10-CM | POA: Diagnosis not present

## 2023-07-07 DIAGNOSIS — Z789 Other specified health status: Secondary | ICD-10-CM | POA: Diagnosis not present

## 2023-07-07 DIAGNOSIS — Z7409 Other reduced mobility: Secondary | ICD-10-CM | POA: Diagnosis not present

## 2023-07-12 DIAGNOSIS — Z789 Other specified health status: Secondary | ICD-10-CM | POA: Diagnosis not present

## 2023-07-12 DIAGNOSIS — Z96652 Presence of left artificial knee joint: Secondary | ICD-10-CM | POA: Diagnosis not present

## 2023-07-12 DIAGNOSIS — Z7409 Other reduced mobility: Secondary | ICD-10-CM | POA: Diagnosis not present

## 2023-07-12 DIAGNOSIS — R29898 Other symptoms and signs involving the musculoskeletal system: Secondary | ICD-10-CM | POA: Diagnosis not present

## 2023-07-12 DIAGNOSIS — M25462 Effusion, left knee: Secondary | ICD-10-CM | POA: Diagnosis not present

## 2023-07-12 DIAGNOSIS — M25662 Stiffness of left knee, not elsewhere classified: Secondary | ICD-10-CM | POA: Diagnosis not present

## 2023-07-14 DIAGNOSIS — M25462 Effusion, left knee: Secondary | ICD-10-CM | POA: Diagnosis not present

## 2023-07-14 DIAGNOSIS — R29898 Other symptoms and signs involving the musculoskeletal system: Secondary | ICD-10-CM | POA: Diagnosis not present

## 2023-07-14 DIAGNOSIS — M25662 Stiffness of left knee, not elsewhere classified: Secondary | ICD-10-CM | POA: Diagnosis not present

## 2023-07-14 DIAGNOSIS — Z7409 Other reduced mobility: Secondary | ICD-10-CM | POA: Diagnosis not present

## 2023-07-14 DIAGNOSIS — Z789 Other specified health status: Secondary | ICD-10-CM | POA: Diagnosis not present

## 2023-07-14 DIAGNOSIS — Z96652 Presence of left artificial knee joint: Secondary | ICD-10-CM | POA: Diagnosis not present

## 2023-07-18 DIAGNOSIS — M25462 Effusion, left knee: Secondary | ICD-10-CM | POA: Diagnosis not present

## 2023-07-18 DIAGNOSIS — Z789 Other specified health status: Secondary | ICD-10-CM | POA: Diagnosis not present

## 2023-07-18 DIAGNOSIS — Z7409 Other reduced mobility: Secondary | ICD-10-CM | POA: Diagnosis not present

## 2023-07-18 DIAGNOSIS — R29898 Other symptoms and signs involving the musculoskeletal system: Secondary | ICD-10-CM | POA: Diagnosis not present

## 2023-07-18 DIAGNOSIS — Z96652 Presence of left artificial knee joint: Secondary | ICD-10-CM | POA: Diagnosis not present

## 2023-07-18 DIAGNOSIS — M25662 Stiffness of left knee, not elsewhere classified: Secondary | ICD-10-CM | POA: Diagnosis not present

## 2023-07-19 ENCOUNTER — Encounter: Payer: Medicare PPO | Admitting: Family Medicine

## 2023-07-21 DIAGNOSIS — M25662 Stiffness of left knee, not elsewhere classified: Secondary | ICD-10-CM | POA: Diagnosis not present

## 2023-07-21 DIAGNOSIS — Z96652 Presence of left artificial knee joint: Secondary | ICD-10-CM | POA: Diagnosis not present

## 2023-07-21 DIAGNOSIS — Z789 Other specified health status: Secondary | ICD-10-CM | POA: Diagnosis not present

## 2023-07-21 DIAGNOSIS — M25462 Effusion, left knee: Secondary | ICD-10-CM | POA: Diagnosis not present

## 2023-07-21 DIAGNOSIS — R29898 Other symptoms and signs involving the musculoskeletal system: Secondary | ICD-10-CM | POA: Diagnosis not present

## 2023-07-21 DIAGNOSIS — Z7409 Other reduced mobility: Secondary | ICD-10-CM | POA: Diagnosis not present

## 2023-07-26 DIAGNOSIS — M25462 Effusion, left knee: Secondary | ICD-10-CM | POA: Diagnosis not present

## 2023-07-26 DIAGNOSIS — M25662 Stiffness of left knee, not elsewhere classified: Secondary | ICD-10-CM | POA: Diagnosis not present

## 2023-07-26 DIAGNOSIS — Z7409 Other reduced mobility: Secondary | ICD-10-CM | POA: Diagnosis not present

## 2023-07-26 DIAGNOSIS — Z789 Other specified health status: Secondary | ICD-10-CM | POA: Diagnosis not present

## 2023-07-26 DIAGNOSIS — R29898 Other symptoms and signs involving the musculoskeletal system: Secondary | ICD-10-CM | POA: Diagnosis not present

## 2023-07-26 DIAGNOSIS — Z96652 Presence of left artificial knee joint: Secondary | ICD-10-CM | POA: Diagnosis not present

## 2023-07-28 DIAGNOSIS — Z96652 Presence of left artificial knee joint: Secondary | ICD-10-CM | POA: Diagnosis not present

## 2023-07-28 DIAGNOSIS — Z789 Other specified health status: Secondary | ICD-10-CM | POA: Diagnosis not present

## 2023-07-28 DIAGNOSIS — M25662 Stiffness of left knee, not elsewhere classified: Secondary | ICD-10-CM | POA: Diagnosis not present

## 2023-07-28 DIAGNOSIS — M25462 Effusion, left knee: Secondary | ICD-10-CM | POA: Diagnosis not present

## 2023-07-28 DIAGNOSIS — R29898 Other symptoms and signs involving the musculoskeletal system: Secondary | ICD-10-CM | POA: Diagnosis not present

## 2023-07-28 DIAGNOSIS — Z7409 Other reduced mobility: Secondary | ICD-10-CM | POA: Diagnosis not present

## 2023-08-02 DIAGNOSIS — M25462 Effusion, left knee: Secondary | ICD-10-CM | POA: Diagnosis not present

## 2023-08-02 DIAGNOSIS — Z96652 Presence of left artificial knee joint: Secondary | ICD-10-CM | POA: Diagnosis not present

## 2023-08-02 DIAGNOSIS — M25662 Stiffness of left knee, not elsewhere classified: Secondary | ICD-10-CM | POA: Diagnosis not present

## 2023-08-02 DIAGNOSIS — Z789 Other specified health status: Secondary | ICD-10-CM | POA: Diagnosis not present

## 2023-08-02 DIAGNOSIS — R29898 Other symptoms and signs involving the musculoskeletal system: Secondary | ICD-10-CM | POA: Diagnosis not present

## 2023-08-02 DIAGNOSIS — Z7409 Other reduced mobility: Secondary | ICD-10-CM | POA: Diagnosis not present

## 2023-08-03 ENCOUNTER — Ambulatory Visit (INDEPENDENT_AMBULATORY_CARE_PROVIDER_SITE_OTHER): Payer: Medicare PPO | Admitting: Family Medicine

## 2023-08-03 ENCOUNTER — Encounter: Payer: Self-pay | Admitting: Family Medicine

## 2023-08-03 VITALS — BP 121/73 | HR 67 | Ht 65.0 in | Wt 219.1 lb

## 2023-08-03 DIAGNOSIS — Z13220 Encounter for screening for lipoid disorders: Secondary | ICD-10-CM | POA: Diagnosis not present

## 2023-08-03 DIAGNOSIS — Z1329 Encounter for screening for other suspected endocrine disorder: Secondary | ICD-10-CM

## 2023-08-03 DIAGNOSIS — R600 Localized edema: Secondary | ICD-10-CM

## 2023-08-03 DIAGNOSIS — I872 Venous insufficiency (chronic) (peripheral): Secondary | ICD-10-CM

## 2023-08-03 DIAGNOSIS — Z0000 Encounter for general adult medical examination without abnormal findings: Secondary | ICD-10-CM | POA: Diagnosis not present

## 2023-08-03 DIAGNOSIS — E559 Vitamin D deficiency, unspecified: Secondary | ICD-10-CM | POA: Diagnosis not present

## 2023-08-03 DIAGNOSIS — E669 Obesity, unspecified: Secondary | ICD-10-CM

## 2023-08-03 MED ORDER — MEDICAL COMPRESSION PANTYHOSE MISC: 2 refills | Status: DC

## 2023-08-03 NOTE — Patient Instructions (Addendum)
F/U  in 6 months, call if you need me sooner  You are up to date with all screening and immunization.  You do need the RSV vaccine one time only if you have not had it, that virus can  cause pneumonia and is recommended for people 68 years old and over   Fasting labs 1 week before your 6 month follow up, CBC, lipid, cmp and EGFR,  TSH and vit D  Thankful that left knee replacement has been a success  Weight loss goal of 10 pounds in 6 months   15 to 19 mmHg compression pantyhose is prescribed and sent to [place], you will be fitted there  I also recommend going to [employer],  near to [employer], they carry Nurse Mates support pantyhose which are very good also  I recommend you try that also and see which one feels better and " does the job"  Best for the season and 2025!  Thanks for choosing [facility], we consider it a privelige to serve you.

## 2023-08-04 DIAGNOSIS — Z789 Other specified health status: Secondary | ICD-10-CM | POA: Diagnosis not present

## 2023-08-04 DIAGNOSIS — Z7409 Other reduced mobility: Secondary | ICD-10-CM | POA: Diagnosis not present

## 2023-08-04 DIAGNOSIS — M25462 Effusion, left knee: Secondary | ICD-10-CM | POA: Diagnosis not present

## 2023-08-04 DIAGNOSIS — Z96652 Presence of left artificial knee joint: Secondary | ICD-10-CM | POA: Diagnosis not present

## 2023-08-04 DIAGNOSIS — R29898 Other symptoms and signs involving the musculoskeletal system: Secondary | ICD-10-CM | POA: Diagnosis not present

## 2023-08-04 DIAGNOSIS — M25662 Stiffness of left knee, not elsewhere classified: Secondary | ICD-10-CM | POA: Diagnosis not present

## 2023-08-06 DIAGNOSIS — Z0000 Encounter for general adult medical examination without abnormal findings: Secondary | ICD-10-CM | POA: Insufficient documentation

## 2023-08-06 NOTE — Assessment & Plan Note (Signed)
Compression hose recommended and prescribed

## 2023-08-06 NOTE — Progress Notes (Signed)
    Stefanie Taylor     MRN: [id number]      DOB: Mar 22, 1956  Chief Complaint  Patient presents with   Annual Exam    CPE    HPI: Patient is in for annual physical exam. . Immunization is reviewed , and  only the RSV vaccine is needed. Cancer screening is up to date   PE: BP 121/73 (BP Location: Right Arm, Patient Position: Sitting, Cuff Size: Large)   Pulse 67   Ht 5\' 5"  (1.651 m)   Wt 219 lb 1.3 oz (99.4 kg)   SpO2 98%   BMI 36.46 kg/m   Pleasant  female, alert and oriented x 3, in no cardio-pulmonary distress. Afebrile. HEENT No facial trauma or asymetry. Sinuses non tender.  Extra occullar muscles intact.. External ears normal, . Neck: supple, no adenopathy,JVD or thyromegaly.No bruits.  Chest: Clear to ascultation bilaterally.No crackles or wheezes. Non tender to palpation  Cardiovascular system; Heart sounds normal,  S1 and  S2 ,no S3.  No murmur, or thrill. Apical beat not displaced Peripheral pulses normal.   Musculoskeletal exam: Full ROM of spine, hips , shoulders and knees.  No muscle wasting or atrophy.   Neurologic: Cranial nerves 2 to 12 intact. Power, tone ,sensation  normal throughout. No disturbance in gait. No tremor.  Skin: Intact, no ulceration, erythema , scaling or rash noted. Pigmentation normal throughout  Psych; Normal mood and affect. Judgement and concentration normal   Assessment & Plan:  Encounter for annual health examination Annual exam as documented. Immunization and cancer screening needs are specifically addressed at this visit.   Bilateral leg edema Compression hose prescribed for support in dependent position and to support knees  Venous insufficiency of both lower extremities Compression hose recommended and prescribed

## 2023-08-06 NOTE — Assessment & Plan Note (Signed)
Compression hose prescribed for support in dependent position and to support knees

## 2023-08-06 NOTE — Assessment & Plan Note (Signed)
Annual exam as documented. . Immunization and cancer screening needs are specifically addressed at this visit.  

## 2023-08-09 DIAGNOSIS — R29898 Other symptoms and signs involving the musculoskeletal system: Secondary | ICD-10-CM | POA: Diagnosis not present

## 2023-08-09 DIAGNOSIS — M25462 Effusion, left knee: Secondary | ICD-10-CM | POA: Diagnosis not present

## 2023-08-09 DIAGNOSIS — Z7409 Other reduced mobility: Secondary | ICD-10-CM | POA: Diagnosis not present

## 2023-08-09 DIAGNOSIS — M25662 Stiffness of left knee, not elsewhere classified: Secondary | ICD-10-CM | POA: Diagnosis not present

## 2023-08-09 DIAGNOSIS — Z96652 Presence of left artificial knee joint: Secondary | ICD-10-CM | POA: Diagnosis not present

## 2023-08-09 DIAGNOSIS — Z789 Other specified health status: Secondary | ICD-10-CM | POA: Diagnosis not present

## 2023-08-12 DIAGNOSIS — M25662 Stiffness of left knee, not elsewhere classified: Secondary | ICD-10-CM | POA: Diagnosis not present

## 2023-08-12 DIAGNOSIS — Z789 Other specified health status: Secondary | ICD-10-CM | POA: Diagnosis not present

## 2023-08-12 DIAGNOSIS — M25462 Effusion, left knee: Secondary | ICD-10-CM | POA: Diagnosis not present

## 2023-08-12 DIAGNOSIS — R29898 Other symptoms and signs involving the musculoskeletal system: Secondary | ICD-10-CM | POA: Diagnosis not present

## 2023-08-12 DIAGNOSIS — Z7409 Other reduced mobility: Secondary | ICD-10-CM | POA: Diagnosis not present

## 2023-08-12 DIAGNOSIS — Z96652 Presence of left artificial knee joint: Secondary | ICD-10-CM | POA: Diagnosis not present

## 2023-08-16 DIAGNOSIS — Z789 Other specified health status: Secondary | ICD-10-CM | POA: Diagnosis not present

## 2023-08-16 DIAGNOSIS — M25462 Effusion, left knee: Secondary | ICD-10-CM | POA: Diagnosis not present

## 2023-08-16 DIAGNOSIS — M25662 Stiffness of left knee, not elsewhere classified: Secondary | ICD-10-CM | POA: Diagnosis not present

## 2023-08-16 DIAGNOSIS — Z7409 Other reduced mobility: Secondary | ICD-10-CM | POA: Diagnosis not present

## 2023-08-16 DIAGNOSIS — R29898 Other symptoms and signs involving the musculoskeletal system: Secondary | ICD-10-CM | POA: Diagnosis not present

## 2023-08-16 DIAGNOSIS — Z96652 Presence of left artificial knee joint: Secondary | ICD-10-CM | POA: Diagnosis not present

## 2023-08-18 DIAGNOSIS — M25462 Effusion, left knee: Secondary | ICD-10-CM | POA: Diagnosis not present

## 2023-08-18 DIAGNOSIS — M25662 Stiffness of left knee, not elsewhere classified: Secondary | ICD-10-CM | POA: Diagnosis not present

## 2023-08-18 DIAGNOSIS — Z7409 Other reduced mobility: Secondary | ICD-10-CM | POA: Diagnosis not present

## 2023-08-18 DIAGNOSIS — Z96652 Presence of left artificial knee joint: Secondary | ICD-10-CM | POA: Diagnosis not present

## 2023-08-18 DIAGNOSIS — Z789 Other specified health status: Secondary | ICD-10-CM | POA: Diagnosis not present

## 2023-08-18 DIAGNOSIS — R29898 Other symptoms and signs involving the musculoskeletal system: Secondary | ICD-10-CM | POA: Diagnosis not present

## 2023-08-23 DIAGNOSIS — Z789 Other specified health status: Secondary | ICD-10-CM | POA: Diagnosis not present

## 2023-08-23 DIAGNOSIS — Z96652 Presence of left artificial knee joint: Secondary | ICD-10-CM | POA: Diagnosis not present

## 2023-08-23 DIAGNOSIS — R29898 Other symptoms and signs involving the musculoskeletal system: Secondary | ICD-10-CM | POA: Diagnosis not present

## 2023-08-23 DIAGNOSIS — Z7409 Other reduced mobility: Secondary | ICD-10-CM | POA: Diagnosis not present

## 2023-08-23 DIAGNOSIS — M25662 Stiffness of left knee, not elsewhere classified: Secondary | ICD-10-CM | POA: Diagnosis not present

## 2023-08-23 DIAGNOSIS — M25462 Effusion, left knee: Secondary | ICD-10-CM | POA: Diagnosis not present

## 2023-08-26 DIAGNOSIS — Z7409 Other reduced mobility: Secondary | ICD-10-CM | POA: Diagnosis not present

## 2023-08-26 DIAGNOSIS — M25462 Effusion, left knee: Secondary | ICD-10-CM | POA: Diagnosis not present

## 2023-08-26 DIAGNOSIS — R29898 Other symptoms and signs involving the musculoskeletal system: Secondary | ICD-10-CM | POA: Diagnosis not present

## 2023-08-26 DIAGNOSIS — M25662 Stiffness of left knee, not elsewhere classified: Secondary | ICD-10-CM | POA: Diagnosis not present

## 2023-08-26 DIAGNOSIS — Z789 Other specified health status: Secondary | ICD-10-CM | POA: Diagnosis not present

## 2023-08-26 DIAGNOSIS — Z96652 Presence of left artificial knee joint: Secondary | ICD-10-CM | POA: Diagnosis not present

## 2023-08-31 DIAGNOSIS — Z789 Other specified health status: Secondary | ICD-10-CM | POA: Diagnosis not present

## 2023-08-31 DIAGNOSIS — R29898 Other symptoms and signs involving the musculoskeletal system: Secondary | ICD-10-CM | POA: Diagnosis not present

## 2023-08-31 DIAGNOSIS — Z96652 Presence of left artificial knee joint: Secondary | ICD-10-CM | POA: Diagnosis not present

## 2023-08-31 DIAGNOSIS — Z7409 Other reduced mobility: Secondary | ICD-10-CM | POA: Diagnosis not present

## 2023-08-31 DIAGNOSIS — M25462 Effusion, left knee: Secondary | ICD-10-CM | POA: Diagnosis not present

## 2023-08-31 DIAGNOSIS — M25662 Stiffness of left knee, not elsewhere classified: Secondary | ICD-10-CM | POA: Diagnosis not present

## 2023-09-09 DIAGNOSIS — M25662 Stiffness of left knee, not elsewhere classified: Secondary | ICD-10-CM | POA: Diagnosis not present

## 2023-09-09 DIAGNOSIS — Z7409 Other reduced mobility: Secondary | ICD-10-CM | POA: Diagnosis not present

## 2023-09-09 DIAGNOSIS — M25462 Effusion, left knee: Secondary | ICD-10-CM | POA: Diagnosis not present

## 2023-09-09 DIAGNOSIS — R29898 Other symptoms and signs involving the musculoskeletal system: Secondary | ICD-10-CM | POA: Diagnosis not present

## 2023-09-09 DIAGNOSIS — Z96652 Presence of left artificial knee joint: Secondary | ICD-10-CM | POA: Diagnosis not present

## 2023-09-09 DIAGNOSIS — Z789 Other specified health status: Secondary | ICD-10-CM | POA: Diagnosis not present

## 2023-09-20 DIAGNOSIS — M25662 Stiffness of left knee, not elsewhere classified: Secondary | ICD-10-CM | POA: Diagnosis not present

## 2023-09-20 DIAGNOSIS — Z7409 Other reduced mobility: Secondary | ICD-10-CM | POA: Diagnosis not present

## 2023-09-20 DIAGNOSIS — Z789 Other specified health status: Secondary | ICD-10-CM | POA: Diagnosis not present

## 2023-09-20 DIAGNOSIS — R29898 Other symptoms and signs involving the musculoskeletal system: Secondary | ICD-10-CM | POA: Diagnosis not present

## 2023-09-20 DIAGNOSIS — Z96652 Presence of left artificial knee joint: Secondary | ICD-10-CM | POA: Diagnosis not present

## 2023-09-20 DIAGNOSIS — M25462 Effusion, left knee: Secondary | ICD-10-CM | POA: Diagnosis not present

## 2023-10-05 ENCOUNTER — Encounter: Payer: Self-pay | Admitting: Family Medicine

## 2023-10-05 ENCOUNTER — Other Ambulatory Visit: Payer: Self-pay | Admitting: Family Medicine

## 2023-10-06 MED ORDER — CETIRIZINE HCL 10 MG PO TABS: 10.0000 mg | ORAL_TABLET | Freq: Every day | ORAL | 3 refills | Status: AC

## 2023-10-07 ENCOUNTER — Telehealth: Payer: Self-pay | Admitting: Family Medicine

## 2023-10-07 NOTE — Telephone Encounter (Signed)
LVM for pt to call back and schedule an office visit for surgical clearance sometime in March per Dr Lodema Hong.

## 2023-10-12 DIAGNOSIS — M170 Bilateral primary osteoarthritis of knee: Secondary | ICD-10-CM | POA: Diagnosis not present

## 2023-10-18 MED ORDER — BENZONATATE 100 MG PO CAPS: 100.0000 mg | ORAL_CAPSULE | Freq: Two times a day (BID) | ORAL | 0 refills | Status: DC | PRN

## 2023-10-18 NOTE — Addendum Note (Signed)
Addended by: Kerri Perches on: 10/18/2023 03:07 PM   Modules accepted: Orders

## 2023-10-18 NOTE — Telephone Encounter (Signed)
 Patient aware.

## 2023-10-18 NOTE — Telephone Encounter (Signed)
 Called pt to confirm AWV telephone for 10/19/23. Patient stated she also wanted to follow up on a medication for her congestion that was to be sent in. She got the allergy  medication but still needs something for the congestion that she has had for about a month

## 2023-10-19 ENCOUNTER — Ambulatory Visit: Payer: Medicare PPO

## 2023-10-19 VITALS — Ht 65.0 in | Wt 219.0 lb

## 2023-10-19 DIAGNOSIS — Z1231 Encounter for screening mammogram for malignant neoplasm of breast: Secondary | ICD-10-CM

## 2023-10-19 DIAGNOSIS — Z1211 Encounter for screening for malignant neoplasm of colon: Secondary | ICD-10-CM

## 2023-10-19 DIAGNOSIS — Z0000 Encounter for general adult medical examination without abnormal findings: Secondary | ICD-10-CM

## 2023-10-19 DIAGNOSIS — Z780 Asymptomatic menopausal state: Secondary | ICD-10-CM

## 2023-10-19 NOTE — Patient Instructions (Addendum)
 Stefanie Taylor , Thank you for taking time to come for your Medicare Wellness Visit. I appreciate your ongoing commitment to your health goals. Please review the following plan we discussed and let me know if I can assist you in the future.   Referrals/Orders/Follow-Ups/Clinician Recommendations: Aim for 30 minutes of exercise or brisk walking, 6-8 glasses of water, and 5 servings of fruits and vegetables each day. An order has been placed for a Cologuard, DEXA scan, and Mammogram (due 9/25).  This is a list of the screening recommended for you and due dates:  Health Maintenance  Topic Date Due   Cologuard (Stool DNA test)  09/24/2023   COVID-19 Vaccine (8 - 2024-25 season) 09/26/2023   DTaP/Tdap/Td vaccine (2 - Td or Tdap) 02/13/2024   Medicare Annual Wellness Visit  10/18/2024   Mammogram  06/05/2025   Pneumonia Vaccine  Completed   Flu Shot  Completed   DEXA scan (bone density measurement)  Completed   Hepatitis C Screening  Completed   Zoster (Shingles) Vaccine  Completed   HPV Vaccine  Aged Out   Colon Cancer Screening  Discontinued    Advanced directives: (Copy Requested) Please bring a copy of your health care power of attorney and living will to the office to be added to your chart at your convenience.    Next Medicare Annual Wellness Visit scheduled for next year: Yes - 10/19/2024

## 2023-10-19 NOTE — Progress Notes (Addendum)
 Subjective:   Stefanie Taylor is a 68 y.o. female who presents for Medicare Annual (Subsequent) preventive examination.  Visit Complete: Virtual I connected with  Stefanie Taylor on 11/04/23 by a audio enabled telemedicine application and verified that I am speaking with the correct person using two identifiers.  Patient Location: Home  Provider Location: Office/Clinic  I discussed the limitations of evaluation and management by telemedicine. The patient expressed understanding and agreed to proceed.  Vital Signs: Because this visit was a virtual/telehealth visit, some criteria may be missing or patient reported. Any vitals not documented were not able to be obtained and vitals that have been documented are patient reported.   Cardiac Risk Factors include: advanced age (>101men, >30 women);none;obesity (BMI >30kg/m2)    Objective:    Today's Vitals   10/19/23 1316  Weight: 219 lb (99.3 kg)  Height: 5' 5 (1.651 m)   Body mass index is 36.44 kg/m.     10/19/2023    1:13 PM 03/07/2023    9:21 AM 09/20/2022    2:10 PM 06/16/2022    4:44 PM 09/17/2021    9:28 AM 05/28/2016    7:39 AM 12/25/2012    8:08 AM  Advanced Directives  Does Patient Have a Medical Advance Directive? Yes No Yes No Yes  Patient has advance directive, copy not in chart  Type of Advance Directive Healthcare Power of [place];Living will    Living will;Healthcare Power of [employer] Power of [place];Living will  Does patient want to make changes to medical advance directive?   No - Patient declined      Copy of Healthcare Power of Attorney in Chart? No - copy requested           Information is confidential and restricted. Go to Review Flowsheets to unlock data.    Current Medications (verified) Outpatient Encounter Medications as of 10/19/2023  Medication Sig   benzonatate  (TESSALON  PERLES) 100 MG capsule Take 1 capsule (100 mg total) by mouth 2 (two) times daily as needed for cough.    cetirizine  (ZYRTEC ) 10 MG tablet Take 1 tablet (10 mg total) by mouth daily.   Elastic Bandages & Supports (MEDICAL COMPRESSION PANTYHOSE) MISC Venous insufficiency of both lower extremities  15-19 mmHg  DX : I87.2   hydrocortisone  2.5 % ointment Apply topically 2 (two) times daily.   montelukast  (SINGULAIR ) 10 MG tablet TAKE 1 TABLET(10 MG) BY MOUTH AT BEDTIME   Multiple Vitamin (MULTIVITAMIN ADULT PO)    triamcinolone  0.1% oint-Eucerin equivalent cream 1:1 mixture Use 1 application twice a day as needed to red itchy areas. Do not use on face, neck, groin, or armpit region   triamcinolone  ointment (KENALOG ) 0.1 % Apply 1 application topically 2 (two) times daily as needed.   Vitamin D , Ergocalciferol , (DRISDOL ) 1.25 MG (50000 UNIT) CAPS capsule TAKE 1 CAPSULE BY MOUTH 1 TIME A WEEK   [DISCONTINUED] gabapentin  (NEURONTIN ) 100 MG capsule Take 100 mg by mouth daily at 6 (six) AM.   No facility-administered encounter medications on file as of 10/19/2023.    Allergies (verified) Elemental sulfur, Lasix  [furosemide ], Sulfamethoxazole, Ibuprofen, Latex, and Penicillins   History: Past Medical History:  Diagnosis Date   Bradycardia    H/O sickle cell trait    Hypertension    Morbid obesity with BMI of 50.0-59.9, adult (HCC) 08/15/2012   BMI: 53.1 kg/m^2   Past Surgical History:  Procedure Laterality Date   BIOPSY BREAST     BREAST BIOPSY  BREAST EXCISIONAL BIOPSY Right    BREAST SURGERY  1998   biopsy mole on right breast   BREATH TEK H PYLORI  09/04/2012   Procedure: BREATH TEK H PYLORI;  Surgeon: Alm VEAR Angle, MD;  Location: [facility];  Service: General;  Laterality: N/A;   BREATH TEK H PYLORI N/A 11/06/2012   Procedure: SHERIDA SHIRLEAN VEAR BRUCE;  Surgeon: Alm VEAR Angle, MD;  Location: [facility];  Service: General;  Laterality: N/A;   COLONOSCOPY WITH PROPOFOL  N/A 05/28/2016   Procedure: COLONOSCOPY WITH PROPOFOL ;  Surgeon: Belvie Just, MD;  Location: [facility];  Service:  Endoscopy;  Laterality: N/A;   GASTRIC BYPASS  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/2017   LEFT KNEE REPLACMENT Left 06/27/2023   TUBAL LIGATION     UMBILICAL HERNIA REPAIR     age 65   Family History  Problem Relation Age of Onset   Hypertension Mother    Heart disease Mother    Cancer Mother    Hypertension Father    Hypertension Sister    Alcohol abuse Sister    Diabetes Brother    Hypertension Brother    Diabetes Sister    Hypertension Sister    Diabetes Sister    Hypertension Sister    Diabetes Brother    Hypertension Brother    Diabetes Brother    Hypertension Brother    Diabetes Brother    Hypertension Brother    Hypertension Brother    Breast cancer Neg Hx    Allergic rhinitis Neg Hx    Asthma Neg Hx    Eczema Neg Hx    Urticaria Neg Hx    Social History   Socioeconomic History   Marital status: Married    Spouse name: Kofi   Number of children: 1   Years of education: 12   Highest education level: Professional school degree (e.g., MD, DDS, DVM, JD)  Occupational History   Occupation: retired pensions consultant  Tobacco Use   Smoking status: Never   Smokeless tobacco: Never  Vaping Use   Vaping status: Never Used  Substance and Sexual Activity   Alcohol use: No    Comment: wine- occ.   Drug use: No   Sexual activity: Yes    Birth control/protection: Surgical    Comment: tubaligation  Other Topics Concern   Not on file  Social History Narrative   Not on file   Social Drivers of Health   Financial Resource Strain: Low Risk  (10/19/2023)   Overall Financial Resource Strain (CARDIA)    Difficulty of Paying Living Expenses: Not hard at all  Food Insecurity: No Food Insecurity (10/19/2023)   Hunger Vital Sign    Worried About Running Out of Food in the Last Year: Never true    Ran Out of Food in the Last Year: Never true  Transportation Needs: No Transportation Needs (10/19/2023)   PRAPARE - Administrator, Civil Service (Medical): No    Lack of Transportation (Non-Medical): No   Physical Activity: Sufficiently Active (10/19/2023)   Exercise Vital Sign    Days of Exercise per Week: 3 days    Minutes of Exercise per Session: 120 min  Stress: No Stress Concern Present (10/19/2023)   [employer] of Occupational Health - Occupational Stress Questionnaire    Feeling of Stress : Not at all  Social Connections: Socially Integrated (10/19/2023)   Social Connection and Isolation Panel [NHANES]    Frequency of Communication with Friends and Family: More than three times a week  Frequency of Social Gatherings with Friends and Family: More than three times a week    Attends Religious Services: More than 4 times per year    Active Member of [employer] or Organizations: Yes    Attends Engineer, Structural: More than 4 times per year    Marital Status: Married    Tobacco Counseling Counseling given: Not Answered   Clinical Intake:  Pre-visit preparation completed: Yes  Pain : No/denies pain     BMI - recorded: 36.44 Diabetes: No  How often do you need to have someone help you when you read instructions, pamphlets, or other written materials from your doctor or pharmacy?: 1 - Never  Interpreter Needed?: No  Information entered by :: Verdie Saba, CMA   Activities of Daily Living    10/19/2023    1:19 PM  In your present state of health, do you have any difficulty performing the following activities:  Hearing? 0  Vision? 0  Difficulty concentrating or making decisions? 0  Walking or climbing stairs? 0  Dressing or bathing? 0  Doing errands, shopping? 0  Preparing Food and eating ? N  Using the Toilet? N  In the past six months, have you accidently leaked urine? N  Do you have problems with loss of bowel control? N  Managing your Medications? N  Managing your Finances? N  Housekeeping or managing your Housekeeping? N    Patient Care Team: Antonetta Rollene BRAVO, MD as PCP - General (Family Medicine) Delford Maude BROCKS, MD as PCP - Cardiology  (Cardiology) Delford Maude BROCKS, MD as Attending Physician (Cardiology) Himmelrich, Camie RAMAN, RD (Inactive) as Dietitian [place])  Indicate any recent Medical Services you may have received from other than [facility] providers in the past year (date may be approximate).     Assessment:   This is a routine wellness examination for [place].  Hearing/Vision screen Hearing Screening - Comments:: Denies hearing difficulties   Vision Screening - Comments:: Wears eyeglasses - sees Dr Devere Kitty w/[facility]   Goals Addressed               This Visit's Progress     Patient Stated (pt-stated)        Patient stated that she plans to lose weight (20lbs) and continue exercising.       Depression Screen    10/19/2023    1:27 PM 08/03/2023    3:09 PM 03/23/2023    8:40 AM 01/13/2023    8:13 AM 09/20/2022    2:28 PM 07/15/2022    8:12 AM 09/17/2021    9:22 AM  PHQ 2/9 Scores  PHQ - 2 Score 0 0 0 0 0 0 0  PHQ- 9 Score    0       Fall Risk    10/19/2023    1:20 PM 08/03/2023    3:08 PM 03/23/2023    8:40 AM 01/13/2023    8:12 AM 09/19/2022    4:59 PM  Fall Risk   Falls in the past year? 0 0 0 0 0  Number falls in past yr: 0 0 0 0 0  Injury with Fall? 0 0 0 0 0  Risk for fall due to : No Fall Risks No Fall Risks No Fall Risks No Fall Risks   Follow up Falls prevention discussed;Falls evaluation completed Falls evaluation completed Falls evaluation completed Falls evaluation completed     MEDICARE RISK AT HOME: Medicare Risk at Home Any stairs in or  around the home?: Yes If so, are there any without handrails?: No Home free of loose throw rugs in walkways, pet beds, electrical cords, etc?: Yes Adequate lighting in your home to reduce risk of falls?: Yes Life alert?: No Use of a cane, walker or w/c?: No Grab bars in the bathroom?: No Shower chair or bench in shower?: Yes Elevated toilet seat or a handicapped toilet?: Yes  TIMED UP AND GO:  Was the test performed?  No    Cognitive  Function:        10/19/2023    1:21 PM 09/20/2022    2:28 PM 09/17/2021    9:34 AM 09/02/2020    9:03 AM  6CIT Screen  What Year? 0 points 0 points 0 points 0 points  What month? 0 points 0 points 0 points 0 points  What time? 0 points 0 points 0 points 0 points  Count back from 20 0 points 0 points 0 points 0 points  Months in reverse 0 points 0 points 0 points 2 points  Repeat phrase 0 points 0 points 0 points 0 points  Total Score 0 points 0 points 0 points 2 points    Immunizations Immunization History  Administered Date(s) Administered   Fluad Quad(high Dose 65+) 09/02/2020, 05/26/2022, 05/18/2023   Influenza,inj,Quad PF,6+ Mos 09/04/2015, 07/14/2016, 09/09/2017, 07/20/2018, 05/03/2019   Influenza-Unspecified 04/18/2019, 07/28/2021   Moderna Covid-19 Vaccine Bivalent Booster 18yrs & up 07/09/2022, 05/18/2023   PFIZER(Purple Top)SARS-COV-2 Vaccination 11/05/2019, 11/26/2019, 06/17/2020   Pfizer Covid-19 Vaccine Bivalent Booster 61yrs & up 07/27/2021, 08/01/2023   Pneumococcal Conjugate-13 04/29/2014   Pneumococcal Polysaccharide-23 03/06/2020   Tdap 02/12/2014   Varicella 04/13/2016   Zoster Recombinant(Shingrix ) 09/22/2018, 01/03/2019   Zoster, Live 10/17/2014, 10/31/2014    TDAP status: Up to date - 02/12/2014  Flu Vaccine status: Up to date - 05/18/2023  Pneumococcal vaccine status: Up to date - 03/06/2020  Covid-19 vaccine status: Completed vaccines - 08/01/23  Qualifies for Shingles Vaccine? Yes   Zostavax completed Yes   Shingrix  Completed?: Yes - 01/03/2019  Screening Tests Health Maintenance  Topic Date Due   Fecal DNA (Cologuard)  09/24/2023   COVID-19 Vaccine (8 - 2024-25 season) 09/26/2023   DTaP/Tdap/Td (2 - Td or Tdap) 02/13/2024   Medicare Annual Wellness (AWV)  10/18/2024   MAMMOGRAM  06/05/2025   Pneumonia Vaccine 66+ Years old  Completed   INFLUENZA VACCINE  Completed   DEXA SCAN  Completed   Hepatitis C Screening  Completed   Zoster Vaccines-  Shingrix   Completed   HPV VACCINES  Aged Out   Colonoscopy  Discontinued    Health Maintenance  Health Maintenance Due  Topic Date Due   Fecal DNA (Cologuard)  09/24/2023   COVID-19 Vaccine (8 - 2024-25 season) 09/26/2023    Colorectal cancer screening: Type of screening: Colonoscopy. Completed 05/28/2016.  Cologuard completed 09/23/2020. Repeat every 3 years  Mammogram status: Ordered 10/19/23. Pt provided with contact info and advised to call to schedule appt. Last MMG 06/06/23. Repeat every 1 year.  Bone Density status: Completed 03/13/2020. Results reflect: Bone density results: OSTEOPENIA. Repeat every 3 years.   Additional Screening:  Hepatitis C Screening: does not qualify  Vision Screening: Recommended annual ophthalmology exams for early detection of glaucoma and other disorders of the eye. Is the patient up to date with their annual eye exam?  Yes  Who is the provider or what is the name of the office in which the patient attends annual eye exams? [facility] - Dr Devere Kitty If pt is not established with a provider, would they like to be referred to a provider to establish care? No .   Dental Screening: Recommended annual dental exams for proper oral hygiene   Community Resource Referral / Chronic Care Management: CRR required this visit?  No   CCM required this visit?  No     Plan:     I have personally reviewed and noted the following in the patient's chart:   Medical and social history Use of alcohol, tobacco or illicit drugs  Current medications and supplements including opioid prescriptions. Patient is not currently taking opioid prescriptions. Functional ability and status Nutritional status Physical activity Advanced directives List of other physicians Hospitalizations, surgeries, and ER visits in previous 12 months Vitals Screenings to include cognitive, depression, and falls Referrals and appointments  In addition, I have reviewed and  discussed with patient certain preventive protocols, quality metrics, and best practice recommendations. A written personalized care plan for preventive services as well as general preventive health recommendations were provided to patient.     Verdie CHRISTELLA Saba, CMA   11/04/2023   After Visit Summary: (MyChart) Due to this being a telephonic visit, the after visit summary with patients personalized plan was offered to patient via MyChart   Nurse Notes: Ordered Cologuard, DEXA scan, and annual Mammogram.

## 2023-11-02 DIAGNOSIS — Z1211 Encounter for screening for malignant neoplasm of colon: Secondary | ICD-10-CM | POA: Diagnosis not present

## 2023-11-04 NOTE — Progress Notes (Signed)
 Complete

## 2023-11-06 ENCOUNTER — Encounter: Payer: Self-pay | Admitting: Family Medicine

## 2023-11-06 LAB — COLOGUARD: COLOGUARD: NEGATIVE

## 2023-11-06 NOTE — Progress Notes (Signed)
 I have reviewed and agree with the AWV documentation. Stefanie Taylor. Lodema Hong, MD [facility] Location Provider: office Location Patient: home

## 2023-11-22 ENCOUNTER — Ambulatory Visit (HOSPITAL_COMMUNITY)
Admission: RE | Admit: 2023-11-22 | Discharge: 2023-11-22 | Disposition: A | Discharge status: Home / Self Care | Source: Ambulatory Visit | Attending: Family Medicine | Admitting: Family Medicine

## 2023-11-22 ENCOUNTER — Other Ambulatory Visit: Payer: Self-pay | Admitting: Family Medicine

## 2023-11-22 ENCOUNTER — Encounter: Payer: Self-pay | Admitting: Family Medicine

## 2023-11-22 ENCOUNTER — Ambulatory Visit: Payer: Medicare PPO | Admitting: Family Medicine

## 2023-11-22 VITALS — BP 132/77 | HR 62 | Ht 65.0 in | Wt 228.0 lb

## 2023-11-22 DIAGNOSIS — J3089 Other allergic rhinitis: Secondary | ICD-10-CM | POA: Diagnosis not present

## 2023-11-22 DIAGNOSIS — R7989 Other specified abnormal findings of blood chemistry: Secondary | ICD-10-CM | POA: Diagnosis not present

## 2023-11-22 DIAGNOSIS — R0989 Other specified symptoms and signs involving the circulatory and respiratory systems: Secondary | ICD-10-CM | POA: Diagnosis not present

## 2023-11-22 DIAGNOSIS — Z01818 Encounter for other preprocedural examination: Secondary | ICD-10-CM

## 2023-11-22 DIAGNOSIS — I517 Cardiomegaly: Secondary | ICD-10-CM | POA: Diagnosis not present

## 2023-11-22 DIAGNOSIS — Z0000 Encounter for general adult medical examination without abnormal findings: Secondary | ICD-10-CM

## 2023-11-22 DIAGNOSIS — J9811 Atelectasis: Secondary | ICD-10-CM | POA: Diagnosis not present

## 2023-11-22 LAB — EKG 12-LEAD

## 2023-11-22 NOTE — Assessment & Plan Note (Addendum)
 CXR, labs CCUA today Exam as documented EKG in office , no acute ischemia or LVH , unchanged from most recent study

## 2023-11-22 NOTE — Patient Instructions (Addendum)
 F/U in September, call if you need me sooner  CXR today  Labs today in office that were ordered in 07/2023, CBC, lipid, cmp and eGFr, TSH, Vit D and HBA1C Also CCUA today  EKG in office today  You may take zyrtec twice daily to help to reduce nasal drainage, continue once daily montelukast   OTC chlorpheniramine once daily as needed, for excess drainage may be needed also, but double the certizine , zyrtec first  All the best with right knee surgery  I  will send a My chart message when  all results are in , for clearance, both top you and to the surgeon  Thanks for choosing [facility], we consider it a privelige to serve you.

## 2023-11-23 ENCOUNTER — Encounter: Payer: Self-pay | Admitting: Family Medicine

## 2023-11-23 DIAGNOSIS — Z13220 Encounter for screening for lipoid disorders: Secondary | ICD-10-CM | POA: Diagnosis not present

## 2023-11-23 DIAGNOSIS — Z1329 Encounter for screening for other suspected endocrine disorder: Secondary | ICD-10-CM | POA: Diagnosis not present

## 2023-11-23 DIAGNOSIS — E669 Obesity, unspecified: Secondary | ICD-10-CM | POA: Diagnosis not present

## 2023-11-23 DIAGNOSIS — E559 Vitamin D deficiency, unspecified: Secondary | ICD-10-CM | POA: Diagnosis not present

## 2023-11-23 LAB — UA/M W/RFLX CULTURE, COMP
Bilirubin, UA: NEGATIVE
Glucose, UA: NEGATIVE
Ketones, UA: NEGATIVE
Leukocytes,UA: NEGATIVE
Nitrite, UA: NEGATIVE
Protein,UA: NEGATIVE
RBC, UA: NEGATIVE
Specific Gravity, UA: 1.012 (ref 1.005–1.030)
Urobilinogen, Ur: 0.2 mg/dL (ref 0.2–1.0)
pH, UA: 6 (ref 5.0–7.5)

## 2023-11-23 LAB — MICROSCOPIC EXAMINATION
Bacteria, UA: NONE SEEN
Casts: NONE SEEN /LPF
Epithelial Cells (non renal): NONE SEEN /HPF (ref 0–10)
RBC, Urine: NONE SEEN /HPF (ref 0–2)
WBC, UA: NONE SEEN /HPF (ref 0–5)

## 2023-11-24 ENCOUNTER — Other Ambulatory Visit: Payer: Self-pay | Admitting: Family Medicine

## 2023-11-24 ENCOUNTER — Encounter: Payer: Self-pay | Admitting: Family Medicine

## 2023-11-24 DIAGNOSIS — R7989 Other specified abnormal findings of blood chemistry: Secondary | ICD-10-CM | POA: Insufficient documentation

## 2023-11-24 DIAGNOSIS — R748 Abnormal levels of other serum enzymes: Secondary | ICD-10-CM

## 2023-11-24 LAB — LAB REPORT - SCANNED
A1c: 4.9
EGFR: 95
HM Hepatitis Screen: NEGATIVE
TSH: 2.25 (ref 0.41–5.90)

## 2023-11-24 LAB — CMP14+EGFR
ALT: 86 IU/L — ABNORMAL HIGH (ref 0–32)
AST: 70 IU/L — ABNORMAL HIGH (ref 0–40)
Albumin: 3.9 g/dL (ref 3.9–4.9)
Alkaline Phosphatase: 112 IU/L (ref 44–121)
BUN/Creatinine Ratio: 16 (ref 12–28)
BUN: 11 mg/dL (ref 8–27)
Bilirubin Total: 0.7 mg/dL (ref 0.0–1.2)
CO2: 25 mmol/L (ref 20–29)
Calcium: 9.1 mg/dL (ref 8.7–10.3)
Chloride: 104 mmol/L (ref 96–106)
Creatinine, Ser: 0.67 mg/dL (ref 0.57–1.00)
Globulin, Total: 2.2 g/dL (ref 1.5–4.5)
Glucose: 83 mg/dL (ref 70–99)
Potassium: 4.4 mmol/L (ref 3.5–5.2)
Sodium: 140 mmol/L (ref 134–144)
Total Protein: 6.1 g/dL (ref 6.0–8.5)
eGFR: 95 mL/min/{1.73_m2} (ref >59)

## 2023-11-24 LAB — CBC
Hematocrit: 37.3 % (ref 34.0–46.6)
Hemoglobin: 12.1 g/dL (ref 11.1–15.9)
MCH: 29.2 pg (ref 26.6–33.0)
MCHC: 32.4 g/dL (ref 31.5–35.7)
MCV: 90 fL (ref 79–97)
Platelets: 178 10*3/uL (ref 150–450)
RBC: 4.15 x10E6/uL (ref 3.77–5.28)
RDW: 14.3 % (ref 11.7–15.4)
WBC: 3.4 10*3/uL (ref 3.4–10.8)

## 2023-11-24 LAB — LIPID PANEL
Chol/HDL Ratio: 2 ratio (ref 0.0–4.4)
Cholesterol, Total: 149 mg/dL (ref 100–199)
HDL: 74 mg/dL (ref >39)
LDL Chol Calc (NIH): 67 mg/dL (ref 0–99)
Triglycerides: 33 mg/dL (ref 0–149)
VLDL Cholesterol Cal: 8 mg/dL (ref 5–40)

## 2023-11-24 LAB — VITAMIN D 25 HYDROXY (VIT D DEFICIENCY, FRACTURES): Vit D, 25-Hydroxy: 41.9 ng/mL (ref 30.0–100.0)

## 2023-11-24 LAB — HEMOGLOBIN A1C
Est. average glucose Bld gHb Est-mCnc: 94 mg/dL
Hgb A1c MFr Bld: 4.9 % (ref 4.8–5.6)

## 2023-11-24 LAB — TSH: TSH: 2.25 u[IU]/mL (ref 0.450–4.500)

## 2023-11-24 NOTE — Assessment & Plan Note (Addendum)
 US liver and GI referral, advised to discontinue any tylenol and any alcohol , does drink wine but not excessively per her report Hepatitis screen is negative Will hold medical clearance until this is sorted out pt in agreement

## 2023-11-29 LAB — SPECIMEN STATUS REPORT

## 2023-11-29 LAB — ACUTE VIRAL HEPATITIS (HAV, HBV, HCV)
HCV Ab: NONREACTIVE
Hep A IgM: NEGATIVE
Hep B C IgM: NEGATIVE
Hepatitis B Surface Ag: NEGATIVE

## 2023-11-29 LAB — HCV INTERPRETATION

## 2023-11-30 ENCOUNTER — Encounter: Payer: Self-pay | Admitting: Family Medicine

## 2023-11-30 MED ORDER — BENZONATATE 200 MG PO CAPS
200.0000 mg | ORAL_CAPSULE | Freq: Two times a day (BID) | ORAL | 0 refills | Status: DC | PRN
Start: 2023-11-30 — End: 2024-01-31

## 2023-11-30 NOTE — Progress Notes (Signed)
   Stefanie Taylor     MRN: [id number]      DOB: [birth year]  Chief Complaint  Patient presents with   Clearance, Surgical    HPI Stefanie Taylor is here for medical clearance for right knee  replacement  ROS Denies recent fever or chills. 2 month h/o runny  nose and cough.Npo sputum, nasal drainage is clear Denies chest pains, palpitations and leg swelling Denies abdominal pain, nausea, vomiting,diarrhea or constipation.   Denies dysuria, frequency, hesitancy or incontinence.  Denies headaches, seizures, numbness, or tingling. Denies depression, anxiety or insomnia. Denies skin break down or rash.   PE  BP 132/77   Pulse 62   Ht 5\' 5"  (1.651 m)   Wt 228 lb (103.4 kg)   SpO2 96%   BMI 37.94 kg/m   Patient alert and oriented and in no cardiopulmonary distress.  HEENT: No facial asymmetry, EOMI,     Neck supple .  Chest: Clear to auscultation bilaterally.  CVS: S1, S2 no murmurs, no S3.Regular rate.  ABD: Soft non tender.   Ext: No edema  MS: Adequate ROM spine, shoulders, hips and reduced in  right  knee.  Skin: Intact, no ulcerations or rash noted.  Psych: Good eye contact, normal affect. Memory intact not anxious or depressed appearing.  CNS: CN 2-12 intact, power,  normal throughout.no focal deficits noted.   Assessment & Plan  Pre-op exam CXR, labs CCUA today Exam as documented EKG in office , no acute ischemia or LVH , unchanged from most recent study  Elevated LFTs US liver and GI referral, advised to discontinue any tylenol and any alcohol , does drink wine but not excessively per her report Hepatitis screen is negative Will hold medical clearance until this is sorted out pt in agreement  Allergic rhinitis Cough with abnormal CXR, uncontrolled , increase certrizine to twice daily , commit to daily singulair, short course of tessalon perles

## 2023-11-30 NOTE — Assessment & Plan Note (Signed)
 Cough with abnormal CXR, uncontrolled , increase certrizine to twice daily , commit to daily singulair, short course of tessalon perles

## 2023-12-01 DIAGNOSIS — R748 Abnormal levels of other serum enzymes: Secondary | ICD-10-CM | POA: Diagnosis not present

## 2023-12-02 LAB — ACUTE VIRAL HEPATITIS (HAV, HBV, HCV)
HCV Ab: NONREACTIVE
Hep A IgM: NEGATIVE
Hep B C IgM: NEGATIVE
Hepatitis B Surface Ag: NEGATIVE

## 2023-12-02 LAB — HCV INTERPRETATION

## 2023-12-07 ENCOUNTER — Encounter (INDEPENDENT_AMBULATORY_CARE_PROVIDER_SITE_OTHER): Payer: Self-pay | Admitting: *Deleted

## 2023-12-08 ENCOUNTER — Ambulatory Visit (INDEPENDENT_AMBULATORY_CARE_PROVIDER_SITE_OTHER): Admitting: Gastroenterology

## 2023-12-08 ENCOUNTER — Ambulatory Visit (HOSPITAL_COMMUNITY)
Admission: RE | Admit: 2023-12-08 | Discharge: 2023-12-08 | Disposition: A | Discharge status: Home / Self Care | Source: Ambulatory Visit | Attending: Family Medicine | Admitting: Family Medicine

## 2023-12-08 ENCOUNTER — Encounter (INDEPENDENT_AMBULATORY_CARE_PROVIDER_SITE_OTHER): Payer: Self-pay | Admitting: Gastroenterology

## 2023-12-08 VITALS — BP 123/68 | HR 52 | Temp 97.5°F | Ht 65.0 in | Wt 225.6 lb

## 2023-12-08 DIAGNOSIS — K760 Fatty (change of) liver, not elsewhere classified: Secondary | ICD-10-CM | POA: Diagnosis not present

## 2023-12-08 DIAGNOSIS — R7989 Other specified abnormal findings of blood chemistry: Secondary | ICD-10-CM

## 2023-12-08 DIAGNOSIS — R748 Abnormal levels of other serum enzymes: Secondary | ICD-10-CM | POA: Diagnosis not present

## 2023-12-08 DIAGNOSIS — Z860101 Personal history of adenomatous and serrated colon polyps: Secondary | ICD-10-CM | POA: Diagnosis not present

## 2023-12-08 NOTE — Patient Instructions (Signed)
 Perform blood workup in 3 months We will need to proceed with repeat colonoscopy in 2027 given history of traditional serrated polyp

## 2023-12-08 NOTE — Progress Notes (Unsigned)
 Katrinka Blazing, M.D. Gastroenterology & Hepatology [facility] [address] [place], [place] [address] Primary Care Physician: Kerri Perches, MD [address] [place] [address]  Referring MD: PCP  Chief Complaint: Elevated LFTs  History of Present Illness: Stefanie Taylor is a 68 y.o. female with past medical history of obesity status post gastric bypass, hypertension, who presents for evaluation of elevated LFTs.  The patient was referred to our clinic after she had elevation of her aminotransferases most recent blood workup performed on 11/23/2023.  AST was 70, ALT 86, total bilirubin normal 0.7, alkaline phosphatase 112, normal electrolytes and renal function.  Prior to these her LFTs were completely normal with values below 35.  Had an acute viral hepatitis performed on 12/01/2023 and 11/23/2023 which were normal.  Patient underwent an ultrasound of the liver today which showed changes suggestive of fatty liver without other abnormalities.  Has not had any recent changes in medications or taking herbs/new supplements. States she had a cold recently and took some drops, but does not remember the name of the medication  The patient denies having any nausea, vomiting, fever, chills, hematochezia, melena, hematemesis, abdominal distention, abdominal pain, diarrhea, jaundice, pruritus or weight changes.  Last EGD: No report available, performed at [facility] on 05/31/2017 for evaluation prior to undergoing bypass surgery Biopsies:  A. Stomach, antrum, endoscopic biopsy:  Gastric antral mucosa with chronic gastritis. No Helicobacter pylori organisms are seen on routine H&E stain. No intestinal metaplasia, dysplasia, or carcinoma is seen.  B. Esophagus at 36 cm, r/o Barrett's, endoscopic biopsy:  Squamous epithelium with mild acute esophagitis. No dysplasia or carcinoma is seen. See comment.  Comment: No glandular  epithelium is present for evaluation of possible goblet cell intestinal metaplasia.   Last Colonoscopy: Performed by Dr. Elnoria Howard on 06/14/2019 Normal colon, advised to repeat colonoscopy in 7 years as the patient had traditional serrated adenoma in 2017.  FHx: neg for any gastrointestinal/liver disease, no malignancies Social: drinks wine once every 1-2 weeks, neg smoking, or illicit drug use Surgical: RYGB  Past Medical History: Past Medical History:  Diagnosis Date   Bradycardia    H/O sickle cell trait    Hypertension    Morbid obesity with BMI of 50.0-59.9, adult (HCC) 08/15/2012   BMI: 53.1 kg/m^2    Past Surgical History: Past Surgical History:  Procedure Laterality Date   BIOPSY BREAST     BREAST BIOPSY     BREAST EXCISIONAL BIOPSY Right    BREAST SURGERY  1998   biopsy mole on right breast   BREATH TEK H PYLORI  09/04/2012   Procedure: BREATH TEK H PYLORI;  Surgeon: Kandis Cocking, MD;  Location: [facility];  Service: General;  Laterality: N/A;   BREATH TEK H PYLORI N/A 11/06/2012   Procedure: [place];  Surgeon: Kandis Cocking, MD;  Location: [facility];  Service: General;  Laterality: N/A;   COLONOSCOPY WITH PROPOFOL N/A 05/28/2016   Procedure: COLONOSCOPY WITH PROPOFOL;  Surgeon: Jeani Hawking, MD;  Location: [facility];  Service: Endoscopy;  Laterality: N/A;   GASTRIC BYPASS  09/07/2017   LEFT KNEE REPLACMENT Left 06/27/2023   TUBAL LIGATION     UMBILICAL HERNIA REPAIR     age 59    Family History: Family History  Problem Relation Age of Onset   Hypertension Mother    Heart disease Mother    Cancer Mother    Hypertension Father  Hypertension Sister    Alcohol abuse Sister    Diabetes Brother    Hypertension Brother    Diabetes Sister    Hypertension Sister    Diabetes Sister    Hypertension Sister    Diabetes Brother    Hypertension Brother    Diabetes Brother    Hypertension Brother    Diabetes Brother    Hypertension Brother     Hypertension Brother    Breast cancer Neg Hx    Allergic rhinitis Neg Hx    Asthma Neg Hx    Eczema Neg Hx    Urticaria Neg Hx     Social History: Social History   Tobacco Use  Smoking Status Never  Smokeless Tobacco Never   Social History   Substance and Sexual Activity  Alcohol Use No   Comment: wine- occ.   Social History   Substance and Sexual Activity  Drug Use No    Allergies: Allergies  Allergen Reactions   Elemental Sulfur    Lasix [Furosemide] Itching and Swelling   Sulfamethoxazole Itching   Ibuprofen Itching and Rash   Latex Rash   Penicillins Rash    Medications: Current Outpatient Medications  Medication Sig Dispense Refill   Ascorbic Acid (VITAMIN C) 1000 MG tablet Take 1,000 mg by mouth daily.     benzonatate (TESSALON) 200 MG capsule Take 1 capsule (200 mg total) by mouth 2 (two) times daily as needed for cough. 20 capsule 0   cetirizine (ZYRTEC) 10 MG tablet Take 1 tablet (10 mg total) by mouth daily. 90 tablet 3   ECHINACEA PO Take by mouth.     magnesium (MAGTAB) 84 MG ( ) TBCR SR tablet Take 84 mg by mouth daily.     Misc Natural Products (YUMVS BEET ROOT-TART CHERRY) 250-0.5 MG CHEW Chew 250 mg by mouth 1 day or 1 dose.     montelukast (SINGULAIR) 10 MG tablet TAKE 1 TABLET(10 MG) BY MOUTH AT BEDTIME 90 tablet 3   Multiple Vitamin (MULTIVITAMIN ADULT PO)      OVER THE COUNTER MEDICATION One daily calcium   Cinnamon one daily     Turmeric 01-999 MG CAPS Take 1,000 mg by mouth 1 day or 1 dose.     zinc gluconate 50 MG tablet Take 50 mg by mouth daily.     Cholecalciferol (VITAMIN D3) 25 MCG (1000 UT) CAPS Takes 4 capsules once daily (4000 units) (Patient not taking: Reported on 12/08/2023)     triamcinolone 0.1% oint-Eucerin equivalent cream 1:1 mixture Use 1 application twice a day as needed to red itchy areas. Do not use on face, neck, groin, or armpit region (Patient not taking: Reported on 12/08/2023) 45 g 3   No current  facility-administered medications for this visit.    Review of Systems: GENERAL: negative for malaise, night sweats HEENT: No changes in hearing or vision, no nose bleeds or other nasal problems. NECK: Negative for lumps, goiter, pain and significant neck swelling RESPIRATORY: Negative for cough, wheezing CARDIOVASCULAR: Negative for chest pain, leg swelling, palpitations, orthopnea GI: SEE HPI MUSCULOSKELETAL: Negative for joint pain or swelling, back pain, and muscle pain. SKIN: Negative for lesions, rash PSYCH: Negative for sleep disturbance, mood disorder and recent psychosocial stressors. HEMATOLOGY Negative for prolonged bleeding, bruising easily, and swollen nodes. ENDOCRINE: Negative for cold or heat intolerance, polyuria, polydipsia and goiter. NEURO: negative for tremor, gait imbalance, syncope and seizures. The remainder of the review of systems is noncontributory.   Physical Exam: BP 123/68  Pulse (!) 52   Temp (!) 97.5 F (36.4 C)   Ht 5\' 5"  (1.651 m)   Wt 225 lb 9.6 oz (102.3 kg)   BMI 37.54 kg/m  GENERAL: The patient is AO x3, in no acute distress. HEENT: Head is normocephalic and atraumatic. EOMI are intact. Mouth is well hydrated and without lesions. NECK: Supple. No masses LUNGS: Clear to auscultation. No presence of rhonchi/wheezing/rales. Adequate chest expansion HEART: RRR, normal s1 and s2. ABDOMEN: Soft, nontender, no guarding, no peritoneal signs, and nondistended. BS +. No masses. EXTREMITIES: Without any cyanosis, clubbing, rash, lesions or edema. NEUROLOGIC: AOx3, no focal motor deficit. SKIN: no jaundice, no rashes   Imaging/Labs: as above  I personally reviewed and interpreted the available labs, imaging and endoscopic files.  Impression and Plan: Stefanie Taylor is a 68 y.o. female with past medical history of obesity status post gastric bypass, hypertension, who presents for evaluation of elevated LFTs.  Patient presented acute  elevation of her aminotransferases recently, which has only been documented in 1 episode.  Had presence of some fatty liver changes in imaging.  Denies starting any recent medications or herbs/supplements. As this is an isolated elevation, will repeat CMP measurement in 3 months and if persistently elevated then will proceed with further serologies to workup this.  I will discourage undergoing Cologuard testing as the patient has a history of a traditional serrated polyp in the past.  She will need to have colonoscopies in the future, with next colonoscopy to be performed in 2027.  - Perform CMP in 3 months - We will need to proceed with repeat colonoscopy in 2027 given history of traditional serrated polyp  All questions were answered.      Katrinka Blazing, MD Gastroenterology and Hepatology [facility]

## 2023-12-11 ENCOUNTER — Encounter: Payer: Self-pay | Admitting: Family Medicine

## 2023-12-21 ENCOUNTER — Encounter: Payer: Self-pay | Admitting: Family Medicine

## 2023-12-22 ENCOUNTER — Encounter: Payer: Self-pay | Admitting: Family Medicine

## 2023-12-26 ENCOUNTER — Telehealth: Payer: Self-pay | Admitting: Family Medicine

## 2023-12-26 NOTE — Telephone Encounter (Signed)
 Pre op clearance  Noted  Copied Sleeved  Original in PCP box Copy front desk folder

## 2024-01-02 ENCOUNTER — Telehealth: Payer: Self-pay | Admitting: Family Medicine

## 2024-01-02 NOTE — Progress Notes (Signed)
 CARDIOLOGY CONSULT NOTE       Patient ID: Stefanie Taylor MRN: [id number] DOB/AGE: February 23, 1956 68 y.o.  Referring Physician: Rodolph Clap Primary Physician: Towanda Fret, MD Primary Cardiologist: Stann Earnest Reason for Consultation: Bradycardia   HPI:  68 y.o. referred by Dr Rodolph Clap for bradycardia. Office visit 09/20/22 pulse recorded at 40 bpm with normal BP  First seen by me 10/18/22 History of obesity with gastric bypass. Chronic back pain Severe spinal stenosis at L3-5 referred to neurosurgery No renal failure K 4 Not on any AV nodal blocking drugs TSH normal PMH includies HTN but currently on no medication ECG done in office did not show pulse of 40 Showed SR rate 78 with PAC;s No AV block Susppect CNA did not auscultate pulse and noted a pulse deficit from PAC;s  NO history of chest pain, syncope Functional dyspnea   Seen by Dr Lafayette Pierre [phone] with normal echo 06/02/17 and normal myovue 06/01/17 with EF 66%  She is out of the house daily Goes to gym and cycles bike and is in pool. Her sciatica limits her more than anything No chest pain , dyspnea, palpitations or syncope She was having bigeminal rhythm today and was unaware of it. There was a pulse deficit at the radial level with the skipped beats not palpable  Monitor 11/01/22 average HR 63 bpm no AV block TTE done 11/09/22 normal EF 60-65%  ETT 10/20/22 max HR 148 bpm no ischemia  Patient has pulse deficit today from PACls She went to [place] on her own 2024 She goes to [place] With her husband as that's where she is from Has family reunion in [place] in July  LFTls up mild wine intake stopped tylenol  and hepatitis screen negative US  ? Fatty liver dx 12/08/23  11/23/23 AST 70 ALT 86 had been normal 10 months prior   Had uncomplicated left TKR with Dr Macky Sayres October 2024 Needs right done latter this year No cardiac issues Brother having CABG in [place]  this week. Had nice [place] cruise with her sisters recently   LDL 67 on no meds     ROS All other systems reviewed and negative except as noted above  Past Medical History:  Diagnosis Date   Bradycardia    H/O sickle cell trait    Hypertension    Morbid obesity with BMI of 50.0-59.9, adult (HCC) 08/15/2012   BMI: 53.1 kg/m^2    Family History  Problem Relation Age of Onset   Hypertension Mother    Heart disease Mother    Cancer Mother    Hypertension Father    Hypertension Sister    Alcohol abuse Sister    Diabetes Brother    Hypertension Brother    Diabetes Sister    Hypertension Sister    Diabetes Sister    Hypertension Sister    Diabetes Brother    Hypertension Brother    Diabetes Brother    Hypertension Brother    Diabetes Brother    Hypertension Brother    Hypertension Brother    Breast cancer Neg Hx    Allergic rhinitis Neg Hx    Asthma Neg Hx    Eczema Neg Hx    Urticaria Neg Hx     Social History   Socioeconomic History   Marital status: Married    Spouse name: Kofi   Number of children: 1   Years of education: 12   Highest education level: Professional school degree (e.g., MD, DDS, DVM, JD)  Occupational History   Occupation:  retired Pensions consultant  Tobacco Use   Smoking status: Never   Smokeless tobacco: Never  Vaping Use   Vaping status: Never Used  Substance and Sexual Activity   Alcohol use: No    Comment: wine- occ.   Drug use: No   Sexual activity: Yes    Birth control/protection: Surgical    Comment: tubaligation  Other Topics Concern   Not on file  Social History Narrative   Not on file   Social Drivers of Health   Financial Resource Strain: Low Risk  (10/19/2023)   Overall Financial Resource Strain (CARDIA)    Difficulty of Paying Living Expenses: Not hard at all  Food Insecurity: No Food Insecurity (10/19/2023)   Hunger Vital Sign    Worried About Running Out of Food in the Last Year: Never true    Ran Out of Food in the Last Year: Never true  Transportation Needs: No Transportation Needs (10/19/2023)   PRAPARE  - Administrator, Civil Service (Medical): No    Lack of Transportation (Non-Medical): No  Physical Activity: Sufficiently Active (10/19/2023)   Exercise Vital Sign    Days of Exercise per Week: 3 days    Minutes of Exercise per Session: 120 min  Stress: No Stress Concern Present (10/19/2023)   [employer] of Occupational Health - Occupational Stress Questionnaire    Feeling of Stress : Not at all  Social Connections: Socially Integrated (10/19/2023)   Social Connection and Isolation Panel [NHANES]    Frequency of Communication with Friends and Family: More than three times a week    Frequency of Social Gatherings with Friends and Family: More than three times a week    Attends Religious Services: More than 4 times per year    Active Member of Clubs or Organizations: Yes    Attends Banker Meetings: More than 4 times per year    Marital Status: Married  Catering manager Violence: Not At Risk (10/19/2023)   Humiliation, Afraid, Rape, and Kick questionnaire    Fear of Current or Ex-Partner: No    Emotionally Abused: No    Physically Abused: No    Sexually Abused: No    Past Surgical History:  Procedure Laterality Date   BIOPSY BREAST     BREAST BIOPSY     BREAST EXCISIONAL BIOPSY Right    BREAST SURGERY  1998   biopsy mole on right breast   BREATH TEK H PYLORI  09/04/2012   Procedure: BREATH TEK H PYLORI;  Surgeon: Thayne Fine, MD;  Location: [facility];  Service: General;  Laterality: N/A;   BREATH TEK H PYLORI N/A 11/06/2012   Procedure: Liisa Reeves;  Surgeon: Thayne Fine, MD;  Location: [facility];  Service: General;  Laterality: N/A;   COLONOSCOPY WITH PROPOFOL  N/A 05/28/2016   Procedure: COLONOSCOPY WITH PROPOFOL ;  Surgeon: Alvis Jourdain, MD;  Location: [facility];  Service: Endoscopy;  Laterality: N/A;   GASTRIC BYPASS  09/07/2017   LEFT KNEE REPLACMENT Left 06/27/2023   TUBAL LIGATION     UMBILICAL HERNIA REPAIR     age 54       Current Outpatient Medications:    Ascorbic Acid (VITAMIN C) 1000 MG tablet, Take 1,000 mg by mouth daily., Disp: , Rfl:    cetirizine  (ZYRTEC ) 10 MG tablet, Take 1 tablet (10 mg total) by mouth daily., Disp: 90 tablet, Rfl: 3   Cholecalciferol (VITAMIN D3) 25 MCG (1000 UT) CAPS, Takes 4 capsules once daily (  4000 units), Disp: , Rfl:    ECHINACEA PO, Take by mouth., Disp: , Rfl:    magnesium (MAGTAB) 84 MG (7MEQ) TBCR SR tablet, Take 84 mg by mouth daily., Disp: , Rfl:    Misc Natural Products (YUMVS BEET ROOT-TART CHERRY) 250-0.5 MG CHEW, Chew 250 mg by mouth 1 day or 1 dose., Disp: , Rfl:    montelukast  (SINGULAIR ) 10 MG tablet, TAKE 1 TABLET(10 MG) BY MOUTH AT BEDTIME, Disp: 90 tablet, Rfl: 3   Multiple Vitamin (MULTIVITAMIN ADULT PO), , Disp: , Rfl:    OVER THE COUNTER MEDICATION, One daily calcium   Cinnamon one daily, Disp: , Rfl:    RESTASIS 0.05 % ophthalmic emulsion, 1 drop 2 (two) times daily., Disp: , Rfl:    triamcinolone  0.1% oint-Eucerin equivalent cream 1:1 mixture, Use 1 application twice a day as needed to red itchy areas. Do not use on face, neck, groin, or armpit region, Disp: 45 g, Rfl: 3   Turmeric 01-999 MG CAPS, Take 1,000 mg by mouth 1 day or 1 dose., Disp: , Rfl:    zinc gluconate 50 MG tablet, Take 50 mg by mouth daily., Disp: , Rfl:    benzonatate  (TESSALON ) 200 MG capsule, Take 1 capsule (200 mg total) by mouth 2 (two) times daily as needed for cough., Disp: 20 capsule, Rfl: 0     Physical Exam: Blood pressure 134/66, pulse (!) 54, height 5\' 5"  (1.651 m), weight 229 lb 3.2 oz (104 kg), SpO2 97%.    Over weight female Lungs clear Normal heart sounds Abdomen benign  Trace edema Palpable pedal pulses  Post left TKR   Labs:   Lab Results  Component Value Date   WBC 3.4 11/23/2023   HGB 12.1 11/23/2023   HCT 37.3 11/23/2023   MCV 90 11/23/2023   PLT 178 11/23/2023   No results for input(s): "NA", "K", "CL", "CO2", "BUN", "CREATININE", "CALCIUM",  "PROT", "BILITOT", "ALKPHOS", "ALT", "AST", "GLUCOSE" in the last 168 hours.  Invalid input(s): "LABALBU" No results found for: "CKTOTAL", "CKMB", "CKMBINDEX", "TROPONINI"  Lab Results  Component Value Date   CHOL 149 11/23/2023   CHOL 142 01/13/2023   CHOL 151 03/17/2022   Lab Results  Component Value Date   HDL 74 11/23/2023   HDL 67 01/13/2023   HDL 79 03/17/2022   Lab Results  Component Value Date   LDLCALC 67 11/23/2023   LDLCALC 66 01/13/2023   LDLCALC 60 03/17/2022   Lab Results  Component Value Date   TRIG 33 11/23/2023   TRIG 38 01/13/2023   TRIG 60 03/17/2022   Lab Results  Component Value Date   CHOLHDL 2.0 11/23/2023   CHOLHDL 2.1 01/13/2023   CHOLHDL 1.9 03/17/2022   No results found for: "LDLDIRECT"    Radiology: No results found.   EKG: see HPI  01/16/2024 SR rate 63 atrial bigeminny otherwise normal   ASSESSMENT AND PLAN:   Bradycardia:  none - pulse deficit from PACls ECG with no AV block Labs ok Not on nodal drugs Monitor with average HR 63 bpm  and peak HR with exercise 148 bpm No indication for PPM  HTN:  Diet and weight loss currently not on medication Spinal Stenosis:  referred to neurosurgery by primary Clear to have general anesthesia and proceed from cardiac standpoint She is now on gabapentin  and has f/u at [facility]:Had uncomplicated left TKR October Clear to have right done this year if needed    F/U in a year  Signed: Janelle Mediate 01/16/2024, 3:10 PM

## 2024-01-02 NOTE — Telephone Encounter (Signed)
 Copied from CRM [id number]. Topic: General - Other >> Jan 02, 2024  2:50 PM Alpha Arts wrote: Reason for CRM: Patient is on her way to pick up copy of Pre op clearance paperwork.

## 2024-01-04 ENCOUNTER — Telehealth: Payer: Self-pay | Admitting: Family Medicine

## 2024-01-04 NOTE — Telephone Encounter (Signed)
 Ov notes, labs and clearance has been faxed in and received a confirmation

## 2024-01-04 NOTE — Telephone Encounter (Signed)
 Forms are on my desk for her to collect

## 2024-01-04 NOTE — Telephone Encounter (Unsigned)
 Copied from CRM [id number]. Topic: General - Other >> Jan 02, 2024  1:54 PM Baldemar Lev wrote: Reason for CRM: Bartholomew Light from Dr. Mara Seminole Lucy's office called requesting   Pre-op clearance needed to have surgery done on her right knee. Needs sign off that she is cleared, along with any labs + office visit notes.   Fax: [phone]  Best contact: [phone]

## 2024-01-16 ENCOUNTER — Encounter: Payer: Self-pay | Admitting: Cardiovascular Disease

## 2024-01-16 ENCOUNTER — Ambulatory Visit: Payer: Medicare PPO | Attending: Cardiovascular Disease | Admitting: Cardiovascular Disease

## 2024-01-16 VITALS — BP 134/66 | HR 54 | Ht 65.0 in | Wt 229.2 lb

## 2024-01-16 DIAGNOSIS — Z01810 Encounter for preprocedural cardiovascular examination: Secondary | ICD-10-CM | POA: Diagnosis not present

## 2024-01-16 DIAGNOSIS — I10 Essential (primary) hypertension: Secondary | ICD-10-CM | POA: Diagnosis not present

## 2024-01-16 DIAGNOSIS — R001 Bradycardia, unspecified: Secondary | ICD-10-CM

## 2024-01-16 NOTE — Patient Instructions (Signed)

## 2024-01-20 DIAGNOSIS — H524 Presbyopia: Secondary | ICD-10-CM | POA: Diagnosis not present

## 2024-01-25 ENCOUNTER — Other Ambulatory Visit: Payer: Self-pay | Admitting: Medical Genetics

## 2024-01-31 ENCOUNTER — Ambulatory Visit: Payer: Medicare PPO | Admitting: Family Medicine

## 2024-01-31 VITALS — BP 125/80 | HR 55 | Resp 16 | Ht 65.0 in | Wt 223.1 lb

## 2024-01-31 DIAGNOSIS — J3089 Other allergic rhinitis: Secondary | ICD-10-CM | POA: Diagnosis not present

## 2024-01-31 DIAGNOSIS — M25561 Pain in right knee: Secondary | ICD-10-CM | POA: Diagnosis not present

## 2024-01-31 DIAGNOSIS — E669 Obesity, unspecified: Secondary | ICD-10-CM | POA: Diagnosis not present

## 2024-01-31 DIAGNOSIS — R748 Abnormal levels of other serum enzymes: Secondary | ICD-10-CM | POA: Diagnosis not present

## 2024-01-31 DIAGNOSIS — K635 Polyp of colon: Secondary | ICD-10-CM | POA: Insufficient documentation

## 2024-01-31 DIAGNOSIS — G8929 Other chronic pain: Secondary | ICD-10-CM | POA: Diagnosis not present

## 2024-01-31 NOTE — Assessment & Plan Note (Signed)
 Rept colonoscopy in 7 years , 2027

## 2024-01-31 NOTE — Assessment & Plan Note (Signed)
 Rept lab end June per gI recommend , and GI will follow up

## 2024-01-31 NOTE — Assessment & Plan Note (Signed)
  Patient re-educated about  the importance of commitment to a  minimum of 150 minutes of exercise per week as able.  The importance of healthy food choices with portion control discussed, as well as eating regularly and within a 12 hour window most days. The need to choose "clean , green" food 50 to 75% of the time is discussed, as well as to make water the primary drink and set a goal of 64 ounces water daily.       01/31/2024    1:47 PM 01/16/2024    2:57 PM 12/08/2023    2:02 PM  Weight /BMI  Weight 223 lb 1.9 oz 229 lb 3.2 oz 225 lb 9.6 oz  Height 5\' 5"  (1.651 m) 5\' 5"  (1.651 m) 5\' 5"  (1.651 m)  BMI 37.13 kg/m2 38.14 kg/m2 37.54 kg/m2    Stable and unchanged

## 2024-01-31 NOTE — Patient Instructions (Signed)
 Annual exam 11/21 or after, call if you need me sooner  Labs 6/28 or after per GI , nurse will provided printed order  NEXT COLONOSCOPY DUE IN 2027 AND NO cologuard testing  Please sched mammogram at breast center at checkout due in September  Best with knee surgery  Thanks for choosing [facility], we consider it a privelige to serve you.

## 2024-01-31 NOTE — Progress Notes (Signed)
   Stefanie Taylor     MRN: [id number]      DOB: 1956/09/07  Chief Complaint  Patient presents with   Medical Management of Chronic Issues    6 month follow up     HPI Stefanie Taylor is here for follow up and re-evaluation of chronic medical conditions, medication management and review of any available recent lab and radiology data.  Preventive health is updated, specifically  Cancer screening and Immunization.   GI consult re fatty liver compleeted and appreciated has rept labs to be done next month Had cardiology clearance completed and is now ready for left knee replacement . The PT denies any adverse reactions to current medications since the last visit.  There are no new concerns.  There are no specific complaints   ROS See HPI  Denies recent fever or chills. Denies sinus pressure, nasal congestion, ear pain or sore throat. Denies chest congestion, productive cough or wheezing. Denies chest pains, palpitations and leg swelling Denies abdominal pain, nausea, vomiting,diarrhea or constipation.   Denies dysuria, frequency, hesitancy or incontinence. . Denies headaches, seizures, numbness, or tingling. Denies depression, anxiety or insomnia. Denies skin break down or rash.   PE  BP 125/80   Pulse (!) 55   Resp 16   Ht 5\' 5"  (1.651 m)   Wt 223 lb 1.9 oz (101.2 kg)   SpO2 98%   BMI 37.13 kg/m   Patient alert and oriented and in no cardiopulmonary distress.  HEENT: No facial asymmetry, EOMI,     Neck supple .  Chest: Clear to auscultation bilaterally.  CVS: S1, S2 no murmurs, no S3.Regular rate.  ABD: Soft non tender.   Ext: No edema  MS: Adequate ROM spine, shoulders, hips and knees.  Skin: Intact, no ulcerations or rash noted.  Psych: Good eye contact, normal affect. Memory intact not anxious or depressed appearing.  CNS: CN 2-12 intact, .no focal deficits noted.   Assessment & Plan  Serrated polyp of colon Rept colonoscopy in 7 years ,  2027  Elevated liver enzymes Rept lab end June per gI recommend , and GI will follow up  Obesity (BMI 30-39.9)  Patient re-educated about  the importance of commitment to a  minimum of 150 minutes of exercise per week as able.  The importance of healthy food choices with portion control discussed, as well as eating regularly and within a 12 hour window most days. The need to choose "clean , green" food 50 to 75% of the time is discussed, as well as to make water the primary drink and set a goal of 64 ounces water daily.       01/31/2024    1:47 PM 01/16/2024    2:57 PM 12/08/2023    2:02 PM  Weight /BMI  Weight 223 lb 1.9 oz 229 lb 3.2 oz 225 lb 9.6 oz  Height 5\' 5"  (1.651 m) 5\' 5"  (1.651 m) 5\' 5"  (1.651 m)  BMI 37.13 kg/m2 38.14 kg/m2 37.54 kg/m2    Stable and unchanged  Allergic rhinitis Controlled, no change in medication   Knee pain, right Cleared medically and by cardiology for rigth knee replacement , date to be determined by Ortho

## 2024-01-31 NOTE — Assessment & Plan Note (Signed)
 Cleared medically and by cardiology for rigth knee replacement , date to be determined by Ortho

## 2024-01-31 NOTE — Assessment & Plan Note (Signed)
 Controlled, no change in medication

## 2024-02-08 ENCOUNTER — Other Ambulatory Visit: Payer: Self-pay | Admitting: Family Medicine

## 2024-02-08 DIAGNOSIS — Z01818 Encounter for other preprocedural examination: Secondary | ICD-10-CM

## 2024-02-08 DIAGNOSIS — M1711 Unilateral primary osteoarthritis, right knee: Secondary | ICD-10-CM | POA: Diagnosis not present

## 2024-02-09 ENCOUNTER — Ambulatory Visit: Payer: Self-pay | Admitting: Family Medicine

## 2024-02-09 LAB — CBC WITH DIFFERENTIAL/PLATELET
Basophils Absolute: 0 10*3/uL (ref 0.0–0.2)
Basos: 1 %
EOS (ABSOLUTE): 0.1 10*3/uL (ref 0.0–0.4)
Eos: 2 %
Hematocrit: 38.9 % (ref 34.0–46.6)
Hemoglobin: 12.2 g/dL (ref 11.1–15.9)
Immature Grans (Abs): 0 10*3/uL (ref 0.0–0.1)
Immature Granulocytes: 0 %
Lymphocytes Absolute: 1 10*3/uL (ref 0.7–3.1)
Lymphs: 32 %
MCH: 28.4 pg (ref 26.6–33.0)
MCHC: 31.4 g/dL — ABNORMAL LOW (ref 31.5–35.7)
MCV: 91 fL (ref 79–97)
Monocytes Absolute: 0.4 10*3/uL (ref 0.1–0.9)
Monocytes: 11 %
Neutrophils Absolute: 1.7 10*3/uL (ref 1.4–7.0)
Neutrophils: 54 %
Platelets: 172 10*3/uL (ref 150–450)
RBC: 4.3 x10E6/uL (ref 3.77–5.28)
RDW: 13.2 % (ref 11.7–15.4)
WBC: 3.2 10*3/uL — ABNORMAL LOW (ref 3.4–10.8)

## 2024-02-09 LAB — CMP14+EGFR
ALT: 27 IU/L (ref 0–32)
AST: 30 IU/L (ref 0–40)
Albumin: 3.9 g/dL (ref 3.9–4.9)
Alkaline Phosphatase: 114 IU/L (ref 44–121)
BUN/Creatinine Ratio: 18 (ref 12–28)
BUN: 11 mg/dL (ref 8–27)
Bilirubin Total: 1 mg/dL (ref 0.0–1.2)
CO2: 23 mmol/L (ref 20–29)
Calcium: 9.1 mg/dL (ref 8.7–10.3)
Chloride: 104 mmol/L (ref 96–106)
Creatinine, Ser: 0.61 mg/dL (ref 0.57–1.00)
Globulin, Total: 2.4 g/dL (ref 1.5–4.5)
Glucose: 87 mg/dL (ref 70–99)
Potassium: 4.4 mmol/L (ref 3.5–5.2)
Sodium: 140 mmol/L (ref 134–144)
Total Protein: 6.3 g/dL (ref 6.0–8.5)
eGFR: 97 mL/min/{1.73_m2} (ref >59)

## 2024-02-21 DIAGNOSIS — M170 Bilateral primary osteoarthritis of knee: Secondary | ICD-10-CM | POA: Diagnosis not present

## 2024-04-20 DIAGNOSIS — G8929 Other chronic pain: Secondary | ICD-10-CM | POA: Diagnosis not present

## 2024-04-20 DIAGNOSIS — M1711 Unilateral primary osteoarthritis, right knee: Secondary | ICD-10-CM | POA: Diagnosis not present

## 2024-04-20 DIAGNOSIS — M25561 Pain in right knee: Secondary | ICD-10-CM | POA: Diagnosis not present

## 2024-04-30 DIAGNOSIS — M25761 Osteophyte, right knee: Secondary | ICD-10-CM | POA: Diagnosis not present

## 2024-04-30 DIAGNOSIS — Z96651 Presence of right artificial knee joint: Secondary | ICD-10-CM | POA: Diagnosis not present

## 2024-04-30 DIAGNOSIS — M1711 Unilateral primary osteoarthritis, right knee: Secondary | ICD-10-CM | POA: Diagnosis not present

## 2024-04-30 DIAGNOSIS — G8918 Other acute postprocedural pain: Secondary | ICD-10-CM | POA: Diagnosis not present

## 2024-05-10 DIAGNOSIS — M25561 Pain in right knee: Secondary | ICD-10-CM | POA: Diagnosis not present

## 2024-05-10 DIAGNOSIS — Z7409 Other reduced mobility: Secondary | ICD-10-CM | POA: Diagnosis not present

## 2024-05-10 DIAGNOSIS — Z96651 Presence of right artificial knee joint: Secondary | ICD-10-CM | POA: Diagnosis not present

## 2024-05-10 DIAGNOSIS — M25461 Effusion, right knee: Secondary | ICD-10-CM | POA: Diagnosis not present

## 2024-05-10 DIAGNOSIS — R29898 Other symptoms and signs involving the musculoskeletal system: Secondary | ICD-10-CM | POA: Diagnosis not present

## 2024-05-10 DIAGNOSIS — M25661 Stiffness of right knee, not elsewhere classified: Secondary | ICD-10-CM | POA: Diagnosis not present

## 2024-05-16 DIAGNOSIS — M25461 Effusion, right knee: Secondary | ICD-10-CM | POA: Diagnosis not present

## 2024-05-16 DIAGNOSIS — Z7409 Other reduced mobility: Secondary | ICD-10-CM | POA: Diagnosis not present

## 2024-05-16 DIAGNOSIS — M25561 Pain in right knee: Secondary | ICD-10-CM | POA: Diagnosis not present

## 2024-05-16 DIAGNOSIS — M25661 Stiffness of right knee, not elsewhere classified: Secondary | ICD-10-CM | POA: Diagnosis not present

## 2024-05-16 DIAGNOSIS — Z96651 Presence of right artificial knee joint: Secondary | ICD-10-CM | POA: Diagnosis not present

## 2024-05-16 DIAGNOSIS — R29898 Other symptoms and signs involving the musculoskeletal system: Secondary | ICD-10-CM | POA: Diagnosis not present

## 2024-05-18 DIAGNOSIS — M25461 Effusion, right knee: Secondary | ICD-10-CM | POA: Diagnosis not present

## 2024-05-18 DIAGNOSIS — M25661 Stiffness of right knee, not elsewhere classified: Secondary | ICD-10-CM | POA: Diagnosis not present

## 2024-05-18 DIAGNOSIS — R29898 Other symptoms and signs involving the musculoskeletal system: Secondary | ICD-10-CM | POA: Diagnosis not present

## 2024-05-18 DIAGNOSIS — M25561 Pain in right knee: Secondary | ICD-10-CM | POA: Diagnosis not present

## 2024-05-18 DIAGNOSIS — Z7409 Other reduced mobility: Secondary | ICD-10-CM | POA: Diagnosis not present

## 2024-05-18 DIAGNOSIS — Z96651 Presence of right artificial knee joint: Secondary | ICD-10-CM | POA: Diagnosis not present

## 2024-05-22 DIAGNOSIS — Z7409 Other reduced mobility: Secondary | ICD-10-CM | POA: Diagnosis not present

## 2024-05-22 DIAGNOSIS — M25461 Effusion, right knee: Secondary | ICD-10-CM | POA: Diagnosis not present

## 2024-05-22 DIAGNOSIS — R29898 Other symptoms and signs involving the musculoskeletal system: Secondary | ICD-10-CM | POA: Diagnosis not present

## 2024-05-22 DIAGNOSIS — Z96651 Presence of right artificial knee joint: Secondary | ICD-10-CM | POA: Diagnosis not present

## 2024-05-22 DIAGNOSIS — M25661 Stiffness of right knee, not elsewhere classified: Secondary | ICD-10-CM | POA: Diagnosis not present

## 2024-05-22 DIAGNOSIS — M25561 Pain in right knee: Secondary | ICD-10-CM | POA: Diagnosis not present

## 2024-05-24 DIAGNOSIS — R29898 Other symptoms and signs involving the musculoskeletal system: Secondary | ICD-10-CM | POA: Diagnosis not present

## 2024-05-24 DIAGNOSIS — M25461 Effusion, right knee: Secondary | ICD-10-CM | POA: Diagnosis not present

## 2024-05-24 DIAGNOSIS — Z7409 Other reduced mobility: Secondary | ICD-10-CM | POA: Diagnosis not present

## 2024-05-24 DIAGNOSIS — M25661 Stiffness of right knee, not elsewhere classified: Secondary | ICD-10-CM | POA: Diagnosis not present

## 2024-05-24 DIAGNOSIS — Z96651 Presence of right artificial knee joint: Secondary | ICD-10-CM | POA: Diagnosis not present

## 2024-05-24 DIAGNOSIS — M25561 Pain in right knee: Secondary | ICD-10-CM | POA: Diagnosis not present

## 2024-05-28 DIAGNOSIS — Z96651 Presence of right artificial knee joint: Secondary | ICD-10-CM | POA: Diagnosis not present

## 2024-05-28 DIAGNOSIS — R29898 Other symptoms and signs involving the musculoskeletal system: Secondary | ICD-10-CM | POA: Diagnosis not present

## 2024-05-28 DIAGNOSIS — Z7409 Other reduced mobility: Secondary | ICD-10-CM | POA: Diagnosis not present

## 2024-05-28 DIAGNOSIS — M25561 Pain in right knee: Secondary | ICD-10-CM | POA: Diagnosis not present

## 2024-05-28 DIAGNOSIS — M25661 Stiffness of right knee, not elsewhere classified: Secondary | ICD-10-CM | POA: Diagnosis not present

## 2024-05-28 DIAGNOSIS — M25461 Effusion, right knee: Secondary | ICD-10-CM | POA: Diagnosis not present

## 2024-05-30 DIAGNOSIS — Z96651 Presence of right artificial knee joint: Secondary | ICD-10-CM | POA: Diagnosis not present

## 2024-05-30 DIAGNOSIS — M25661 Stiffness of right knee, not elsewhere classified: Secondary | ICD-10-CM | POA: Diagnosis not present

## 2024-05-30 DIAGNOSIS — R29898 Other symptoms and signs involving the musculoskeletal system: Secondary | ICD-10-CM | POA: Diagnosis not present

## 2024-05-30 DIAGNOSIS — Z7409 Other reduced mobility: Secondary | ICD-10-CM | POA: Diagnosis not present

## 2024-05-30 DIAGNOSIS — M25461 Effusion, right knee: Secondary | ICD-10-CM | POA: Diagnosis not present

## 2024-05-30 DIAGNOSIS — M25561 Pain in right knee: Secondary | ICD-10-CM | POA: Diagnosis not present

## 2024-06-04 DIAGNOSIS — M25461 Effusion, right knee: Secondary | ICD-10-CM | POA: Diagnosis not present

## 2024-06-04 DIAGNOSIS — R29898 Other symptoms and signs involving the musculoskeletal system: Secondary | ICD-10-CM | POA: Diagnosis not present

## 2024-06-04 DIAGNOSIS — M25561 Pain in right knee: Secondary | ICD-10-CM | POA: Diagnosis not present

## 2024-06-04 DIAGNOSIS — M25661 Stiffness of right knee, not elsewhere classified: Secondary | ICD-10-CM | POA: Diagnosis not present

## 2024-06-04 DIAGNOSIS — Z7409 Other reduced mobility: Secondary | ICD-10-CM | POA: Diagnosis not present

## 2024-06-04 DIAGNOSIS — Z96651 Presence of right artificial knee joint: Secondary | ICD-10-CM | POA: Diagnosis not present

## 2024-06-06 ENCOUNTER — Ambulatory Visit
Admission: RE | Admit: 2024-06-06 | Discharge: 2024-06-06 | Disposition: A | Discharge status: Home / Self Care | Source: Ambulatory Visit | Attending: Family Medicine | Admitting: Family Medicine

## 2024-06-06 DIAGNOSIS — R29898 Other symptoms and signs involving the musculoskeletal system: Secondary | ICD-10-CM | POA: Diagnosis not present

## 2024-06-06 DIAGNOSIS — M25661 Stiffness of right knee, not elsewhere classified: Secondary | ICD-10-CM | POA: Diagnosis not present

## 2024-06-06 DIAGNOSIS — Z1231 Encounter for screening mammogram for malignant neoplasm of breast: Secondary | ICD-10-CM

## 2024-06-06 DIAGNOSIS — Z7409 Other reduced mobility: Secondary | ICD-10-CM | POA: Diagnosis not present

## 2024-06-06 DIAGNOSIS — Z0000 Encounter for general adult medical examination without abnormal findings: Secondary | ICD-10-CM

## 2024-06-06 DIAGNOSIS — M25461 Effusion, right knee: Secondary | ICD-10-CM | POA: Diagnosis not present

## 2024-06-06 DIAGNOSIS — Z96651 Presence of right artificial knee joint: Secondary | ICD-10-CM | POA: Diagnosis not present

## 2024-06-06 DIAGNOSIS — M25561 Pain in right knee: Secondary | ICD-10-CM | POA: Diagnosis not present

## 2024-06-11 DIAGNOSIS — R29898 Other symptoms and signs involving the musculoskeletal system: Secondary | ICD-10-CM | POA: Diagnosis not present

## 2024-06-11 DIAGNOSIS — M25661 Stiffness of right knee, not elsewhere classified: Secondary | ICD-10-CM | POA: Diagnosis not present

## 2024-06-11 DIAGNOSIS — M25561 Pain in right knee: Secondary | ICD-10-CM | POA: Diagnosis not present

## 2024-06-11 DIAGNOSIS — Z7409 Other reduced mobility: Secondary | ICD-10-CM | POA: Diagnosis not present

## 2024-06-11 DIAGNOSIS — Z96651 Presence of right artificial knee joint: Secondary | ICD-10-CM | POA: Diagnosis not present

## 2024-06-11 DIAGNOSIS — M25461 Effusion, right knee: Secondary | ICD-10-CM | POA: Diagnosis not present

## 2024-06-13 DIAGNOSIS — Z96651 Presence of right artificial knee joint: Secondary | ICD-10-CM | POA: Diagnosis not present

## 2024-06-13 DIAGNOSIS — M25461 Effusion, right knee: Secondary | ICD-10-CM | POA: Diagnosis not present

## 2024-06-13 DIAGNOSIS — M25561 Pain in right knee: Secondary | ICD-10-CM | POA: Diagnosis not present

## 2024-06-13 DIAGNOSIS — Z7409 Other reduced mobility: Secondary | ICD-10-CM | POA: Diagnosis not present

## 2024-06-13 DIAGNOSIS — M25661 Stiffness of right knee, not elsewhere classified: Secondary | ICD-10-CM | POA: Diagnosis not present

## 2024-06-18 DIAGNOSIS — Z96651 Presence of right artificial knee joint: Secondary | ICD-10-CM | POA: Diagnosis not present

## 2024-06-18 DIAGNOSIS — Z7409 Other reduced mobility: Secondary | ICD-10-CM | POA: Diagnosis not present

## 2024-06-18 DIAGNOSIS — R29898 Other symptoms and signs involving the musculoskeletal system: Secondary | ICD-10-CM | POA: Diagnosis not present

## 2024-06-18 DIAGNOSIS — M25461 Effusion, right knee: Secondary | ICD-10-CM | POA: Diagnosis not present

## 2024-06-18 DIAGNOSIS — M25561 Pain in right knee: Secondary | ICD-10-CM | POA: Diagnosis not present

## 2024-06-18 DIAGNOSIS — M25661 Stiffness of right knee, not elsewhere classified: Secondary | ICD-10-CM | POA: Diagnosis not present

## 2024-06-20 DIAGNOSIS — M25561 Pain in right knee: Secondary | ICD-10-CM | POA: Diagnosis not present

## 2024-06-20 DIAGNOSIS — R29898 Other symptoms and signs involving the musculoskeletal system: Secondary | ICD-10-CM | POA: Diagnosis not present

## 2024-06-20 DIAGNOSIS — M25661 Stiffness of right knee, not elsewhere classified: Secondary | ICD-10-CM | POA: Diagnosis not present

## 2024-06-20 DIAGNOSIS — Z96651 Presence of right artificial knee joint: Secondary | ICD-10-CM | POA: Diagnosis not present

## 2024-06-20 DIAGNOSIS — Z7409 Other reduced mobility: Secondary | ICD-10-CM | POA: Diagnosis not present

## 2024-06-20 DIAGNOSIS — M25461 Effusion, right knee: Secondary | ICD-10-CM | POA: Diagnosis not present

## 2024-06-21 ENCOUNTER — Ambulatory Visit (HOSPITAL_COMMUNITY)
Admission: RE | Admit: 2024-06-21 | Discharge: 2024-06-21 | Disposition: A | Discharge status: Home / Self Care | Source: Ambulatory Visit | Attending: Vascular Surgery | Admitting: Vascular Surgery

## 2024-06-21 ENCOUNTER — Other Ambulatory Visit (HOSPITAL_COMMUNITY): Payer: Self-pay | Admitting: Family Medicine

## 2024-06-21 DIAGNOSIS — M79604 Pain in right leg: Secondary | ICD-10-CM | POA: Insufficient documentation

## 2024-06-26 DIAGNOSIS — Z124 Encounter for screening for malignant neoplasm of cervix: Secondary | ICD-10-CM | POA: Diagnosis not present

## 2024-06-26 DIAGNOSIS — Z01419 Encounter for gynecological examination (general) (routine) without abnormal findings: Secondary | ICD-10-CM | POA: Diagnosis not present

## 2024-06-27 ENCOUNTER — Encounter (INDEPENDENT_AMBULATORY_CARE_PROVIDER_SITE_OTHER): Payer: Self-pay | Admitting: Gastroenterology

## 2024-06-27 DIAGNOSIS — M25461 Effusion, right knee: Secondary | ICD-10-CM | POA: Diagnosis not present

## 2024-06-27 DIAGNOSIS — R29898 Other symptoms and signs involving the musculoskeletal system: Secondary | ICD-10-CM | POA: Diagnosis not present

## 2024-06-27 DIAGNOSIS — Z96651 Presence of right artificial knee joint: Secondary | ICD-10-CM | POA: Diagnosis not present

## 2024-06-27 DIAGNOSIS — Z7409 Other reduced mobility: Secondary | ICD-10-CM | POA: Diagnosis not present

## 2024-06-27 DIAGNOSIS — M25561 Pain in right knee: Secondary | ICD-10-CM | POA: Diagnosis not present

## 2024-06-27 DIAGNOSIS — M25661 Stiffness of right knee, not elsewhere classified: Secondary | ICD-10-CM | POA: Diagnosis not present

## 2024-06-29 ENCOUNTER — Ambulatory Visit: Payer: Self-pay

## 2024-06-30 ENCOUNTER — Ambulatory Visit: Payer: Self-pay

## 2024-07-03 DIAGNOSIS — R29898 Other symptoms and signs involving the musculoskeletal system: Secondary | ICD-10-CM | POA: Diagnosis not present

## 2024-07-03 DIAGNOSIS — M25561 Pain in right knee: Secondary | ICD-10-CM | POA: Diagnosis not present

## 2024-07-03 DIAGNOSIS — Z7409 Other reduced mobility: Secondary | ICD-10-CM | POA: Diagnosis not present

## 2024-07-03 DIAGNOSIS — Z96651 Presence of right artificial knee joint: Secondary | ICD-10-CM | POA: Diagnosis not present

## 2024-07-03 DIAGNOSIS — M25461 Effusion, right knee: Secondary | ICD-10-CM | POA: Diagnosis not present

## 2024-07-03 DIAGNOSIS — M25661 Stiffness of right knee, not elsewhere classified: Secondary | ICD-10-CM | POA: Diagnosis not present

## 2024-07-06 DIAGNOSIS — M25461 Effusion, right knee: Secondary | ICD-10-CM | POA: Diagnosis not present

## 2024-07-06 DIAGNOSIS — M25661 Stiffness of right knee, not elsewhere classified: Secondary | ICD-10-CM | POA: Diagnosis not present

## 2024-07-06 DIAGNOSIS — M25561 Pain in right knee: Secondary | ICD-10-CM | POA: Diagnosis not present

## 2024-07-06 DIAGNOSIS — Z96651 Presence of right artificial knee joint: Secondary | ICD-10-CM | POA: Diagnosis not present

## 2024-07-06 DIAGNOSIS — R29898 Other symptoms and signs involving the musculoskeletal system: Secondary | ICD-10-CM | POA: Diagnosis not present

## 2024-07-06 DIAGNOSIS — Z7409 Other reduced mobility: Secondary | ICD-10-CM | POA: Diagnosis not present

## 2024-07-09 ENCOUNTER — Other Ambulatory Visit: Payer: Self-pay | Admitting: Medical Genetics

## 2024-07-09 DIAGNOSIS — M25461 Effusion, right knee: Secondary | ICD-10-CM | POA: Diagnosis not present

## 2024-07-09 DIAGNOSIS — Z96651 Presence of right artificial knee joint: Secondary | ICD-10-CM | POA: Diagnosis not present

## 2024-07-09 DIAGNOSIS — Z006 Encounter for examination for normal comparison and control in clinical research program: Secondary | ICD-10-CM

## 2024-07-09 DIAGNOSIS — M25661 Stiffness of right knee, not elsewhere classified: Secondary | ICD-10-CM | POA: Diagnosis not present

## 2024-07-09 DIAGNOSIS — M25561 Pain in right knee: Secondary | ICD-10-CM | POA: Diagnosis not present

## 2024-07-09 DIAGNOSIS — R29898 Other symptoms and signs involving the musculoskeletal system: Secondary | ICD-10-CM | POA: Diagnosis not present

## 2024-07-09 DIAGNOSIS — Z7409 Other reduced mobility: Secondary | ICD-10-CM | POA: Diagnosis not present

## 2024-07-11 DIAGNOSIS — M25561 Pain in right knee: Secondary | ICD-10-CM | POA: Diagnosis not present

## 2024-07-11 DIAGNOSIS — Z96651 Presence of right artificial knee joint: Secondary | ICD-10-CM | POA: Diagnosis not present

## 2024-07-11 DIAGNOSIS — M25661 Stiffness of right knee, not elsewhere classified: Secondary | ICD-10-CM | POA: Diagnosis not present

## 2024-07-11 DIAGNOSIS — M25461 Effusion, right knee: Secondary | ICD-10-CM | POA: Diagnosis not present

## 2024-07-11 DIAGNOSIS — R29898 Other symptoms and signs involving the musculoskeletal system: Secondary | ICD-10-CM | POA: Diagnosis not present

## 2024-07-11 DIAGNOSIS — Z7409 Other reduced mobility: Secondary | ICD-10-CM | POA: Diagnosis not present

## 2024-07-19 DIAGNOSIS — M25661 Stiffness of right knee, not elsewhere classified: Secondary | ICD-10-CM | POA: Diagnosis not present

## 2024-07-19 DIAGNOSIS — Z96652 Presence of left artificial knee joint: Secondary | ICD-10-CM | POA: Diagnosis not present

## 2024-07-19 DIAGNOSIS — Z7409 Other reduced mobility: Secondary | ICD-10-CM | POA: Diagnosis not present

## 2024-07-19 DIAGNOSIS — M25461 Effusion, right knee: Secondary | ICD-10-CM | POA: Diagnosis not present

## 2024-07-19 DIAGNOSIS — R29898 Other symptoms and signs involving the musculoskeletal system: Secondary | ICD-10-CM | POA: Diagnosis not present

## 2024-07-19 DIAGNOSIS — G8929 Other chronic pain: Secondary | ICD-10-CM | POA: Diagnosis not present

## 2024-07-19 DIAGNOSIS — M25562 Pain in left knee: Secondary | ICD-10-CM | POA: Diagnosis not present

## 2024-07-19 DIAGNOSIS — Z96651 Presence of right artificial knee joint: Secondary | ICD-10-CM | POA: Diagnosis not present

## 2024-07-19 DIAGNOSIS — M25561 Pain in right knee: Secondary | ICD-10-CM | POA: Diagnosis not present

## 2024-07-31 ENCOUNTER — Telehealth: Payer: Self-pay | Admitting: Family Medicine

## 2024-07-31 NOTE — Telephone Encounter (Signed)
 Do you want to work her in sooner no openings til Feb. 2026.  Copied from CRM [id number]. Topic: Appointments - Appointment Scheduling >> Jul 31, 2024  2:48 PM Rosaria BRAVO wrote: Pt called requesting a physical however she declined the next available appt stating that her PCP will not want her out for that long. She just cancelled her appt for this Friday because of her husband having a medical emergency. She is requesting a work in appt. Please advise  Best contact: [phone]

## 2024-08-01 NOTE — Telephone Encounter (Signed)
 Called and patient scheduled for 12/1 @ 9 am

## 2024-08-03 ENCOUNTER — Encounter: Admitting: Family Medicine

## 2024-08-13 ENCOUNTER — Ambulatory Visit: Admitting: Family Medicine

## 2024-08-13 ENCOUNTER — Encounter: Payer: Self-pay | Admitting: Family Medicine

## 2024-08-13 VITALS — BP 125/72 | HR 64 | Resp 16 | Ht 65.0 in

## 2024-08-13 DIAGNOSIS — Z0000 Encounter for general adult medical examination without abnormal findings: Secondary | ICD-10-CM

## 2024-08-13 DIAGNOSIS — R7989 Other specified abnormal findings of blood chemistry: Secondary | ICD-10-CM | POA: Diagnosis not present

## 2024-08-13 DIAGNOSIS — E559 Vitamin D deficiency, unspecified: Secondary | ICD-10-CM | POA: Diagnosis not present

## 2024-08-13 DIAGNOSIS — Z13220 Encounter for screening for lipoid disorders: Secondary | ICD-10-CM

## 2024-08-13 DIAGNOSIS — Z23 Encounter for immunization: Secondary | ICD-10-CM

## 2024-08-13 MED ORDER — UNABLE TO FIND: 0 refills | Status: AC

## 2024-08-13 NOTE — Patient Instructions (Addendum)
 F/U in 6 months  Fasting CBC, lipid, cmp and EGFr, TSH and vit D in March  Need TdaP we will  give you script  Think about what you will eat, plan ahead. Choose  clean, green, fresh or frozen over canned, processed or packaged foods which are more sugary, salty and fatty. 70 to 75% of food eaten should be vegetables and fruit. Three meals at set times with snacks allowed between meals, but they must be fruit or vegetables. Aim to eat over a 12 hour period , example 7 am to 7 pm, and STOP after  your last meal of the day. Drink water,generally about 64 ounces per day, no other drink is as healthy. Fruit juice is best enjoyed in a healthy way, by EATING the fruit.   Thanks for choosing [facility], we consider it a privelige to serve you.

## 2024-08-15 ENCOUNTER — Encounter: Payer: Self-pay | Admitting: Family Medicine

## 2024-08-15 DIAGNOSIS — E559 Vitamin D deficiency, unspecified: Secondary | ICD-10-CM | POA: Insufficient documentation

## 2024-08-15 DIAGNOSIS — Z13220 Encounter for screening for lipoid disorders: Secondary | ICD-10-CM | POA: Insufficient documentation

## 2024-08-15 DIAGNOSIS — Z23 Encounter for immunization: Secondary | ICD-10-CM | POA: Insufficient documentation

## 2024-08-15 NOTE — Assessment & Plan Note (Signed)
 Annual exam as documented. . Immunization and cancer screening needs are specifically addressed at this visit.

## 2024-08-15 NOTE — Progress Notes (Signed)
    Stefanie Taylor     MRN: [id number]      DOB: [birth year]  Chief Complaint  Patient presents with   Annual Exam    Cpe     HPI: Patient is in for annual physical exam.  Immunization is reviewed , and  updated if needed.   PE: BP 125/72   Pulse 64   Resp 16   Ht 5' 5 (1.651 m)   SpO2 97%   BMI 37.13 kg/m   Pleasant  female, alert and oriented x 3, in no cardio-pulmonary distress. Afebrile. HEENT No facial trauma or asymetry. .  Extra occullar muscles intact.. External ears normal, . Neck: supple.  Chest: Clear to ascultation bilaterally.No crackles or wheezes. Non tender to palpation  Breast: Not examined, mammogram normal in 2025 and no concern voiced re lumps or masses   Cardiovascular system; Heart sounds normal,  S1 and  S2 ,no S3.  No murmur, or thrill. .  Abdomen: Soft, non tender.     Musculoskeletal exam: Adequate  ROM of spine, hips , shoulders and knees.  No muscle wasting or atrophy.   Neurologic: Cranial nerves 2 to 12 intact. Normal power in all extremities , no focal deficits notedr.  Skin: Intact, no ulceration, erythema , scaling or rash noted. Pigmentation normal throughout  Psych; Normal mood and affect. Judgement and concentration normal   Assessment & Plan:  Encounter for annual health examination Annual exam as documented. . Immunization and cancer screening needs are specifically addressed at this visit.

## 2024-08-15 NOTE — Assessment & Plan Note (Signed)
 Script provided for TdAP to be obtained at pharmacy

## 2024-08-16 DIAGNOSIS — Z96652 Presence of left artificial knee joint: Secondary | ICD-10-CM | POA: Diagnosis not present

## 2024-08-16 DIAGNOSIS — Z96651 Presence of right artificial knee joint: Secondary | ICD-10-CM | POA: Diagnosis not present

## 2024-09-11 ENCOUNTER — Telehealth: Payer: Self-pay

## 2024-09-11 MED ORDER — AZITHROMYCIN 250 MG PO TABS: ORAL_TABLET | ORAL | 0 refills | Status: AC

## 2024-09-11 NOTE — Telephone Encounter (Signed)
 Pals call pt and ask specifically if she has a positive test for influenza documented as in home kit . If she does then I will rx tamiflu  per routine for  5 days, but NEED to be sure e she ahs a positive test for flu Advise generlal measures fuid, rest tylenol  for viral illness also pls

## 2024-09-11 NOTE — Telephone Encounter (Signed)
 Copied from CRM [id number]. Topic: Clinical - Medication Question >> Sep 11, 2024 12:53 PM Charlet HERO wrote: Reason for CRM: Patient states she has the flu and she would like a tamiflu  sent in to [facility] [place] - [address] AT [facility] [address] [facility] [address] [place] [address] Phone: [phone] Fax: [phone] Hours: Not open 24 hours

## 2024-09-11 NOTE — Addendum Note (Signed)
 Addended by: ANTONETTA ROLLENE BRAVO on: 09/11/2024 04:41 PM   Modules accepted: Orders

## 2024-09-11 NOTE — Telephone Encounter (Signed)
 Pt informed

## 2024-10-19 ENCOUNTER — Encounter: Payer: Medicare PPO | Admitting: Family Medicine

## 2024-10-22 ENCOUNTER — Encounter

## 2024-10-23 ENCOUNTER — Ambulatory Visit

## 2025-02-12 ENCOUNTER — Ambulatory Visit: Admitting: Family Medicine
